# Patient Record
Sex: Female | Born: 1935 | Race: Black or African American | Hispanic: No | Marital: Single | Smoking: Former smoker
Health system: Southern US, Community
[De-identification: ages and names within clinical notes are randomized; demographics above are authoritative.]

## PROBLEM LIST (undated history)

## (undated) DIAGNOSIS — F0280 Dementia in other diseases classified elsewhere without behavioral disturbance: Secondary | ICD-10-CM

## (undated) DIAGNOSIS — G3109 Other frontotemporal dementia: Secondary | ICD-10-CM

## (undated) DIAGNOSIS — H269 Unspecified cataract: Secondary | ICD-10-CM

## (undated) DIAGNOSIS — I10 Essential (primary) hypertension: Secondary | ICD-10-CM

## (undated) DIAGNOSIS — Z8619 Personal history of other infectious and parasitic diseases: Secondary | ICD-10-CM

## (undated) HISTORY — PX: FINGER SURGERY: SHX640

---

## 2013-04-25 ENCOUNTER — Emergency Department (HOSPITAL_COMMUNITY): Payer: Medicare Other

## 2013-04-25 ENCOUNTER — Encounter (HOSPITAL_COMMUNITY): Payer: Self-pay | Admitting: Emergency Medicine

## 2013-04-25 ENCOUNTER — Emergency Department (HOSPITAL_COMMUNITY)
Admission: EM | Admit: 2013-04-25 | Discharge: 2013-04-25 | Disposition: A | Discharge status: Home / Self Care | Payer: Medicare Other | Attending: Emergency Medicine | Admitting: Emergency Medicine

## 2013-04-25 DIAGNOSIS — F29 Unspecified psychosis not due to a substance or known physiological condition: Secondary | ICD-10-CM | POA: Insufficient documentation

## 2013-04-25 DIAGNOSIS — I10 Essential (primary) hypertension: Secondary | ICD-10-CM | POA: Insufficient documentation

## 2013-04-25 DIAGNOSIS — F0390 Unspecified dementia without behavioral disturbance: Secondary | ICD-10-CM | POA: Insufficient documentation

## 2013-04-25 HISTORY — DX: Other frontotemporal dementia: G31.09

## 2013-04-25 HISTORY — DX: Dementia in other diseases classified elsewhere, unspecified severity, without behavioral disturbance, psychotic disturbance, mood disturbance, and anxiety: F02.80

## 2013-04-25 HISTORY — DX: Essential (primary) hypertension: I10

## 2013-04-25 LAB — CBC WITH DIFFERENTIAL/PLATELET
BASOS ABS: 0 10*3/uL (ref 0.0–0.1)
BASOS PCT: 0 % (ref 0–1)
EOS ABS: 0.1 10*3/uL (ref 0.0–0.7)
Eosinophils Relative: 2 % (ref 0–5)
HCT: 37.1 % (ref 36.0–46.0)
Hemoglobin: 11.9 g/dL — ABNORMAL LOW (ref 12.0–15.0)
Lymphocytes Relative: 31 % (ref 12–46)
Lymphs Abs: 1.6 10*3/uL (ref 0.7–4.0)
MCH: 27.9 pg (ref 26.0–34.0)
MCHC: 32.1 g/dL (ref 30.0–36.0)
MCV: 86.9 fL (ref 78.0–100.0)
MONOS PCT: 7 % (ref 3–12)
Monocytes Absolute: 0.4 10*3/uL (ref 0.1–1.0)
NEUTROS PCT: 60 % (ref 43–77)
Neutro Abs: 3 10*3/uL (ref 1.7–7.7)
PLATELETS: 169 10*3/uL (ref 150–400)
RBC: 4.27 MIL/uL (ref 3.87–5.11)
RDW: 14.8 % (ref 11.5–15.5)
WBC: 5 10*3/uL (ref 4.0–10.5)

## 2013-04-25 LAB — COMPREHENSIVE METABOLIC PANEL
ALBUMIN: 3.7 g/dL (ref 3.5–5.2)
ALT: 12 U/L (ref 0–35)
AST: 17 U/L (ref 0–37)
Alkaline Phosphatase: 58 U/L (ref 39–117)
BUN: 19 mg/dL (ref 6–23)
CO2: 23 mEq/L (ref 19–32)
Calcium: 9.5 mg/dL (ref 8.4–10.5)
Chloride: 102 mEq/L (ref 96–112)
Creatinine, Ser: 0.94 mg/dL (ref 0.50–1.10)
GFR calc Af Amer: 66 mL/min — ABNORMAL LOW (ref >90)
GFR calc non Af Amer: 57 mL/min — ABNORMAL LOW (ref >90)
GLUCOSE: 88 mg/dL (ref 70–99)
POTASSIUM: 4.5 meq/L (ref 3.7–5.3)
SODIUM: 140 meq/L (ref 137–147)
TOTAL PROTEIN: 7.4 g/dL (ref 6.0–8.3)
Total Bilirubin: 0.3 mg/dL (ref 0.3–1.2)

## 2013-04-25 LAB — URINALYSIS, ROUTINE W REFLEX MICROSCOPIC
Bilirubin Urine: NEGATIVE
Glucose, UA: NEGATIVE mg/dL
Hgb urine dipstick: NEGATIVE
Ketones, ur: NEGATIVE mg/dL
LEUKOCYTES UA: NEGATIVE
NITRITE: NEGATIVE
PROTEIN: NEGATIVE mg/dL
SPECIFIC GRAVITY, URINE: 1.015 (ref 1.005–1.030)
Urobilinogen, UA: 0.2 mg/dL (ref 0.0–1.0)
pH: 6 (ref 5.0–8.0)

## 2013-04-25 LAB — I-STAT TROPONIN, ED: TROPONIN I, POC: 0 ng/mL (ref 0.00–0.08)

## 2013-04-25 MED ORDER — LISINOPRIL 10 MG PO TABS: 10.0000 mg | ORAL_TABLET | Freq: Once | ORAL | Status: DC

## 2013-04-25 MED ORDER — LISINOPRIL 10 MG PO TABS
10.0000 mg | ORAL_TABLET | Freq: Once | ORAL | Status: AC
  Administered 2013-04-25: 10 mg via ORAL
  Filled 2013-04-25: qty 1

## 2013-04-25 MED ORDER — METOPROLOL TARTRATE 25 MG PO TABS: 25.0000 mg | ORAL_TABLET | Freq: Once | ORAL | Status: DC

## 2013-04-25 MED ORDER — HYDROCHLOROTHIAZIDE 25 MG PO TABS
25.0000 mg | ORAL_TABLET | Freq: Every day | ORAL | Status: DC
  Administered 2013-04-25: 25 mg via ORAL
  Filled 2013-04-25: qty 1

## 2013-04-25 MED ORDER — LISINOPRIL 20 MG PO TABS: 20.0000 mg | ORAL_TABLET | Freq: Every day | ORAL | Status: DC

## 2013-04-25 NOTE — ED Notes (Signed)
Patient transported to CT 

## 2013-04-25 NOTE — Discharge Instructions (Signed)
You need a sitter while awake. You should be placed at memory care unit when there is an open bed.   Continue taking namenda as prescribed.   Increased lisinopril to 20 mg daily.   Repeat blood pressure in a week.   Follow up with your doctor.   Return to ER if you are falling, more confused.

## 2013-04-25 NOTE — ED Notes (Signed)
Family at bedside. 

## 2013-04-25 NOTE — ED Notes (Signed)
MD at bedside. 

## 2013-04-25 NOTE — ED Notes (Signed)
Pt brought here by family from [facility], pt is hallucinating and has increased confusion since yesterday, she has lucid periods but then will think she is in [place]

## 2013-04-25 NOTE — ED Provider Notes (Signed)
CSN: [id number]     Arrival date & time 04/25/13  1336 History   First MD Initiated Contact with Patient 04/25/13 1343     Chief Complaint  Patient presents with  . Altered Mental Status     (Consider location/radiation/quality/duration/timing/severity/associated sxs/prior Treatment) The history is provided by the patient and the nursing home. The history is limited by the condition of the patient.  Marissa Chung is a 80 y.o. female hx of frontal lobe dementia, hypertension here with confusion, hallucinations. As per assisted living, she has been more confused than usual. She is from [facility]. She thought she is still back in [place]. She also saw someone in her room that is sleeping there. Denies auditory hallucinations. She was diagnosed with dementia in January when she was hospitalized for confusion. Denies fever, chills, or cough or chest pain or abdominal pain.    Level V caveat- dementia   Past Medical History  Diagnosis Date  . Hypertension   . Frontal lobe dementia    History reviewed. No pertinent past surgical history. History reviewed. No pertinent family history. History  Substance Use Topics  . Smoking status: Never Smoker   . Smokeless tobacco: Not on file  . Alcohol Use: No   OB History   Grav Para Term Preterm Abortions TAB SAB Ect Mult Living                 Review of Systems  Unable to perform ROS: Dementia      Allergies  Shellfish allergy  Home Medications   Prior to Admission medications   Not on File   BP 185/83  Pulse 64  Temp(Src) 98 F (36.7 C) (Oral)  Resp 7  SpO2 100% Physical Exam  Nursing note and vitals reviewed. Constitutional:  Chronically ill, confused   HENT:  Head: Normocephalic.  Eyes: Conjunctivae are normal. Pupils are equal, round, and reactive to light.  Neck: Normal range of motion. Neck supple.  Cardiovascular: Normal rate, regular rhythm and normal heart sounds.   Pulmonary/Chest: Effort  normal and breath sounds normal. No respiratory distress. She has no wheezes. She has no rales.  Abdominal: Soft. Bowel sounds are normal. She exhibits no distension. There is no tenderness. There is no rebound.  Musculoskeletal: Normal range of motion.  Neurological: She is alert.  A&O x 2. Thinks she is in [place]. Nl strength throughout   Skin: Skin is warm and dry.  Psychiatric:  Confused     ED Course  Procedures (including critical care time) Labs Review Labs Reviewed  COMPREHENSIVE METABOLIC PANEL - Abnormal; Notable for the following:    GFR calc non Af Amer 57 (*)    GFR calc Af Amer 66 (*)    All other components within normal limits  CBC WITH DIFFERENTIAL - Abnormal; Notable for the following:    Hemoglobin 11.9 (*)    All other components within normal limits  URINE CULTURE  URINALYSIS, ROUTINE W REFLEX MICROSCOPIC  CBC WITH DIFFERENTIAL  I-STAT TROPOININ, ED    Imaging Review Ct Head Wo Contrast  04/25/2013   CLINICAL DATA:  Altered mental status.  EXAM: CT HEAD WITHOUT CONTRAST  TECHNIQUE: Contiguous axial images were obtained from the base of the skull through the vertex without intravenous contrast.  COMPARISON:  None.  FINDINGS: No acute infarct, hemorrhage, or mass lesion is present. The ventricles are of normal size. No significant extraaxial fluid collection is present.  May left sphenoid sinuses chronically opacified. The paranasal  sinuses and mastoid air cells are otherwise clear. The osseous skull is intact.  IMPRESSION: 1. Normal CT appearance of the brain for age. 2. 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   Electronically Signed   By: Gennette Pachris  Mattern M.D.   On: 04/25/2013 14:45     EKG Interpretation None      MDM   Final diagnoses:  None    Marissa Chung is a 80 y.o. female here with confusion, visual hallucinations. Consider worsening frontal dementia vs sepsis. Will do sepsis workup. Will likely need memory care unit for dementia.   5:37  PM I called assisted living, they feel that she needs a sitter until they have opening at the memory care unit. Social work involved. CT head and labs and UA unremarkable. Stable to d/c back.    Richardean Canalavid H Stepheny Canal, MD 04/25/13 [phone]

## 2013-04-25 NOTE — Progress Notes (Signed)
EC CM and ED CSW  consulted to meet with patient and family regarding change of LOC. [facility]  With increased confusion, as per family. Pt  Has history of Dementia. [facility]. Family states. she needs a higher level of care, and would like her transferred to memory care unit. However, [facility] do not currently have any Memory care unit beds. Niece Jasmine DecemberSharon [phone] stated, these changes has been increased  since her Namenda had been discontinued. ED work up U/A, CBC, CMP, Head CT all unremarkable. [facility] at [facility] that no significant changes in patient status, she stated, that patient can return with 24 hour sitter. The facility apparently had a conversation about the 24hr sitters prior with the family as per Gershon CullPriscilla Nurse at [facility]. Discussed the 24 hour sitter with Niece she verbalizes understanding and  is agreeable. Offered assistance with Private duty list, she declined reports she has already set up for a sitter for tonight 7-7a. Patient and Family agreeable to return with 24 hour sitters, until a memory unit bed is available. Discussed discharge plan with Dr. Silverio LayYao, he is agreeable with the discharge plan.  My contact information provided if any questions or concerns should arises. No further ED CM needs identified.

## 2013-04-26 LAB — URINE CULTURE: Colony Count: 9000

## 2013-07-26 ENCOUNTER — Emergency Department (HOSPITAL_COMMUNITY): Payer: Medicare Other

## 2013-07-26 ENCOUNTER — Inpatient Hospital Stay (HOSPITAL_COMMUNITY)
Admission: EM | Admit: 2013-07-26 | Discharge: 2013-07-28 | DRG: 069 | Disposition: A | Discharge status: Home Health | Payer: Medicare Other | Attending: Internal Medicine | Admitting: Internal Medicine

## 2013-07-26 DIAGNOSIS — R634 Abnormal weight loss: Secondary | ICD-10-CM | POA: Diagnosis present

## 2013-07-26 DIAGNOSIS — E785 Hyperlipidemia, unspecified: Secondary | ICD-10-CM | POA: Diagnosis present

## 2013-07-26 DIAGNOSIS — D649 Anemia, unspecified: Secondary | ICD-10-CM | POA: Diagnosis present

## 2013-07-26 DIAGNOSIS — G459 Transient cerebral ischemic attack, unspecified: Principal | ICD-10-CM | POA: Diagnosis present

## 2013-07-26 DIAGNOSIS — I10 Essential (primary) hypertension: Secondary | ICD-10-CM | POA: Diagnosis present

## 2013-07-26 DIAGNOSIS — I674 Hypertensive encephalopathy: Secondary | ICD-10-CM | POA: Diagnosis present

## 2013-07-26 DIAGNOSIS — F0390 Unspecified dementia without behavioral disturbance: Secondary | ICD-10-CM | POA: Diagnosis present

## 2013-07-26 DIAGNOSIS — Z91013 Allergy to seafood: Secondary | ICD-10-CM

## 2013-07-26 DIAGNOSIS — G9340 Encephalopathy, unspecified: Secondary | ICD-10-CM | POA: Diagnosis present

## 2013-07-26 DIAGNOSIS — IMO0002 Reserved for concepts with insufficient information to code with codable children: Secondary | ICD-10-CM

## 2013-07-26 DIAGNOSIS — Z79899 Other long term (current) drug therapy: Secondary | ICD-10-CM | POA: Diagnosis not present

## 2013-07-26 DIAGNOSIS — R531 Weakness: Secondary | ICD-10-CM

## 2013-07-26 DIAGNOSIS — I519 Heart disease, unspecified: Secondary | ICD-10-CM | POA: Diagnosis present

## 2013-07-26 DIAGNOSIS — I6350 Cerebral infarction due to unspecified occlusion or stenosis of unspecified cerebral artery: Secondary | ICD-10-CM | POA: Diagnosis not present

## 2013-07-26 DIAGNOSIS — D638 Anemia in other chronic diseases classified elsewhere: Secondary | ICD-10-CM | POA: Diagnosis present

## 2013-07-26 DIAGNOSIS — G3109 Other frontotemporal dementia: Secondary | ICD-10-CM

## 2013-07-26 DIAGNOSIS — R29810 Facial weakness: Secondary | ICD-10-CM | POA: Diagnosis present

## 2013-07-26 DIAGNOSIS — E46 Unspecified protein-calorie malnutrition: Secondary | ICD-10-CM | POA: Diagnosis present

## 2013-07-26 DIAGNOSIS — R630 Anorexia: Secondary | ICD-10-CM | POA: Diagnosis present

## 2013-07-26 DIAGNOSIS — F0280 Dementia in other diseases classified elsewhere without behavioral disturbance: Secondary | ICD-10-CM | POA: Diagnosis present

## 2013-07-26 DIAGNOSIS — R5381 Other malaise: Secondary | ICD-10-CM | POA: Diagnosis present

## 2013-07-26 DIAGNOSIS — R5383 Other fatigue: Secondary | ICD-10-CM | POA: Diagnosis present

## 2013-07-26 LAB — I-STAT TROPONIN, ED: TROPONIN I, POC: 0 ng/mL (ref 0.00–0.08)

## 2013-07-26 LAB — DIFFERENTIAL
Basophils Absolute: 0 10*3/uL (ref 0.0–0.1)
Basophils Relative: 0 % (ref 0–1)
EOS PCT: 1 % (ref 0–5)
Eosinophils Absolute: 0 10*3/uL (ref 0.0–0.7)
LYMPHS PCT: 21 % (ref 12–46)
Lymphs Abs: 1 10*3/uL (ref 0.7–4.0)
MONO ABS: 0.4 10*3/uL (ref 0.1–1.0)
MONOS PCT: 8 % (ref 3–12)
NEUTROS ABS: 3.5 10*3/uL (ref 1.7–7.7)
Neutrophils Relative %: 70 % (ref 43–77)

## 2013-07-26 LAB — PROTIME-INR
INR: 1.09 (ref 0.00–1.49)
Prothrombin Time: 14.1 seconds (ref 11.6–15.2)

## 2013-07-26 LAB — COMPREHENSIVE METABOLIC PANEL
ALT: 11 U/L (ref 0–35)
AST: 15 U/L (ref 0–37)
Albumin: 3.3 g/dL — ABNORMAL LOW (ref 3.5–5.2)
Alkaline Phosphatase: 57 U/L (ref 39–117)
Anion gap: 13 (ref 5–15)
BILIRUBIN TOTAL: 0.5 mg/dL (ref 0.3–1.2)
BUN: 21 mg/dL (ref 6–23)
CALCIUM: 9.1 mg/dL (ref 8.4–10.5)
CHLORIDE: 100 meq/L (ref 96–112)
CO2: 24 mEq/L (ref 19–32)
CREATININE: 1.08 mg/dL (ref 0.50–1.10)
GFR, EST AFRICAN AMERICAN: 55 mL/min — AB (ref >90)
GFR, EST NON AFRICAN AMERICAN: 48 mL/min — AB (ref >90)
GLUCOSE: 147 mg/dL — AB (ref 70–99)
Potassium: 4.1 mEq/L (ref 3.7–5.3)
Sodium: 137 mEq/L (ref 137–147)
Total Protein: 6.6 g/dL (ref 6.0–8.3)

## 2013-07-26 LAB — MRSA PCR SCREENING: MRSA BY PCR: NEGATIVE

## 2013-07-26 LAB — APTT: APTT: 30 s (ref 24–37)

## 2013-07-26 LAB — CBC
HCT: 32 % — ABNORMAL LOW (ref 36.0–46.0)
HEMOGLOBIN: 10.4 g/dL — AB (ref 12.0–15.0)
MCH: 28.3 pg (ref 26.0–34.0)
MCHC: 32.5 g/dL (ref 30.0–36.0)
MCV: 87 fL (ref 78.0–100.0)
Platelets: 181 10*3/uL (ref 150–400)
RBC: 3.68 MIL/uL — ABNORMAL LOW (ref 3.87–5.11)
RDW: 14.6 % (ref 11.5–15.5)
WBC: 5 10*3/uL (ref 4.0–10.5)

## 2013-07-26 LAB — URINE MICROSCOPIC-ADD ON

## 2013-07-26 LAB — URINALYSIS, ROUTINE W REFLEX MICROSCOPIC
BILIRUBIN URINE: NEGATIVE
Glucose, UA: NEGATIVE mg/dL
Hgb urine dipstick: NEGATIVE
KETONES UR: NEGATIVE mg/dL
Nitrite: NEGATIVE
Protein, ur: NEGATIVE mg/dL
Specific Gravity, Urine: 1.005 (ref 1.005–1.030)
UROBILINOGEN UA: 0.2 mg/dL (ref 0.0–1.0)
pH: 6.5 (ref 5.0–8.0)

## 2013-07-26 MED ORDER — HYDRALAZINE HCL 20 MG/ML IJ SOLN
10.0000 mg | Freq: Four times a day (QID) | INTRAMUSCULAR | Status: DC | PRN
  Administered 2013-07-26: 10 mg via INTRAVENOUS
  Filled 2013-07-26: qty 1
  Filled 2013-07-26: qty 0.5

## 2013-07-26 MED ORDER — RISPERIDONE 0.5 MG PO TABS
0.5000 mg | ORAL_TABLET | Freq: Every day | ORAL | Status: DC
  Administered 2013-07-26 – 2013-07-27 (×2): 0.5 mg via ORAL
  Filled 2013-07-26 (×3): qty 1

## 2013-07-26 MED ORDER — SIMVASTATIN 10 MG PO TABS
10.0000 mg | ORAL_TABLET | Freq: Every day | ORAL | Status: DC
  Administered 2013-07-26 – 2013-07-27 (×2): 10 mg via ORAL
  Filled 2013-07-26 (×3): qty 1

## 2013-07-26 MED ORDER — TRAZODONE HCL 150 MG PO TABS
150.0000 mg | ORAL_TABLET | Freq: Every day | ORAL | Status: DC
  Administered 2013-07-26 – 2013-07-27 (×2): 150 mg via ORAL
  Filled 2013-07-26: qty 1
  Filled 2013-07-26: qty 3
  Filled 2013-07-26: qty 1

## 2013-07-26 MED ORDER — HYDRALAZINE HCL 20 MG/ML IJ SOLN
10.0000 mg | Freq: Once | INTRAMUSCULAR | Status: AC
  Administered 2013-07-26: 10 mg via INTRAVENOUS
  Filled 2013-07-26: qty 1

## 2013-07-26 MED ORDER — SENNOSIDES-DOCUSATE SODIUM 8.6-50 MG PO TABS: 1.0000 | ORAL_TABLET | Freq: Every evening | ORAL | Status: DC | PRN

## 2013-07-26 MED ORDER — RISPERIDONE 0.25 MG PO TABS
0.2500 mg | ORAL_TABLET | Freq: Every morning | ORAL | Status: DC
Start: 2013-07-27 — End: 2013-07-28
  Administered 2013-07-27 – 2013-07-28 (×2): 0.25 mg via ORAL
  Filled 2013-07-26 (×2): qty 1

## 2013-07-26 MED ORDER — STROKE: EARLY STAGES OF RECOVERY BOOK
Freq: Once | Status: DC
  Filled 2013-07-26: qty 1

## 2013-07-26 MED ORDER — MEMANTINE HCL 5 MG PO TABS
5.0000 mg | ORAL_TABLET | Freq: Two times a day (BID) | ORAL | Status: DC
  Administered 2013-07-26 – 2013-07-28 (×4): 5 mg via ORAL
  Filled 2013-07-26 (×5): qty 1

## 2013-07-26 MED ORDER — ASPIRIN 300 MG RE SUPP
300.0000 mg | Freq: Once | RECTAL | Status: AC
  Administered 2013-07-26: 300 mg via RECTAL
  Filled 2013-07-26: qty 1

## 2013-07-26 MED ORDER — RISPERIDONE 0.25 MG PO TABS
0.2500 mg | ORAL_TABLET | Freq: Two times a day (BID) | ORAL | Status: DC
  Filled 2013-07-26: qty 2

## 2013-07-26 MED ORDER — NICARDIPINE HCL IN NACL 20-0.86 MG/200ML-% IV SOLN
3.0000 mg/h | INTRAVENOUS | Status: DC
  Filled 2013-07-26: qty 200

## 2013-07-26 NOTE — H&P (Addendum)
[facility] History and Physical  [name] ZOX:[id number] DOB: 1933-08-01 DOA: 07/26/2013  Referring physician: ER physician PCP: Florentina JennyRIPP, HENRY, MD   Chief Complaint:   HPI:  80 year old female with past medical history of dementia, from an independent living facility, history of hypertension who [facility] with new onset lethargy and confusion. Patient is not a good historian due to history fo dementia but her daughter at the bedside is providing history. Patient's daughter came to visit her at ILF and noticed that her mother was very lethargic and seemed to be confused and had hard time speaking. Although she has dementia this was definitely worse than her baseline. No reports of loss of consciousness. No hallucinations. She did notice right sided facial droop but here in ED pt seems to have more of a left sided facial droop. No chest pain, shortness of breath or palpitations.  In ED, vitals are as follows: BP 141/70 and then 219/81. HR 63-74 and T max 98.7 F. Oxygen saturation was 95% on room air. Blood work revealed hemoglobin of 10.4. CT head did not show acute intracranial findings. She was admitted for TIA/CVA work up. Due to accelerated hypertension she was admitted to SDU.  Assessment & Plan    Active Problems:   Lethargy - admit for stroke work up - admit to SDU  - proceed with MRI brain - PT/OT/SLP evaluation - A1C, lipid panel, 2 D ECHO, carotid doppler - aspirin PR until swallow eval passed    Accelerated hypertension  - hydralazine PRN and instructed initial SBP goal to be in 170's range   HLD - lipid panel - continue statin    Anemia of chronic disease  - no signs of active bleeding - repeat CBC in AM  DVT prophylaxis: SCD's bilaterally  Radiological Exams on Admission: Ct Head Wo Contrast 07/26/2013   IMPRESSION: 1. No acute intracranial abnormalities. 2. 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   Electronically Signed   By: Trudie Reedaniel  Entrikin M.D.   On: 07/26/2013 14:21    EKG: pending upon admission   Code Status: Full Family Communication: Plan of care discussed with the patient  Disposition Plan: Admit for further evaluation  Manson PasseyEVINE, Kaire Stary, MD  [facility] Pager [phone]  Review of Systems:  Constitutional: Negative for fever, chills and malaise/fatigue. Negative for diaphoresis.  HENT: Negative for hearing loss, ear pain, nosebleeds, congestion, sore throat, neck pain, tinnitus and ear discharge.   Eyes: Negative for blurred vision, double vision, photophobia, pain, discharge and redness.  Respiratory: Negative for cough, hemoptysis, sputum production, shortness of breath, wheezing and stridor.   Cardiovascular: Negative for chest pain, palpitations, orthopnea, claudication and leg swelling.  Gastrointestinal: Negative for nausea, vomiting and abdominal pain. Negative for heartburn, constipation, blood in stool and melena.  Genitourinary: Negative for dysuria, urgency, frequency, hematuria and flank pain.  Musculoskeletal: Negative for myalgias, back pain, joint pain and falls.  Skin: Negative for itching and rash.  Neurological: per HPI Endo/Heme/Allergies: Negative for environmental allergies and polydipsia. Does not bruise/bleed easily.  Psychiatric/Behavioral: Negative for suicidal ideas. The patient is not nervous/anxious.      Past Medical History  Diagnosis Date  . Hypertension   . Frontal lobe dementia    No past surgical history on file. Social History:  reports that she has never smoked. She does not have any smokeless tobacco history on file. She reports that she does not drink alcohol or use  illicit drugs.  Allergies  Allergen Reactions  . Shellfish Allergy Hives    Family History: unable to obtain due to history of dementia   Prior to Admission medications   Medication Sig Start Date End Date Taking? Authorizing Provider   lisinopril (PRINIVIL,ZESTRIL) 20 MG tablet Take 1 tablet (20 mg total) by mouth daily. 04/25/13  Yes Richardean Canal, MD  memantine (NAMENDA) 5 MG tablet Take 5 mg by mouth 2 (two) times daily.   Yes Historical Provider, MD  risperiDONE (RISPERDAL) 0.25 MG tablet Take 0.25-0.5 mg by mouth 2 (two) times daily. .25 mg am and .50 mg at bedtime   Yes Historical Provider, MD  simvastatin (ZOCOR) 10 MG tablet Take 10 mg by mouth at bedtime.   Yes Historical Provider, MD  traZODone (DESYREL) 150 MG tablet Take 150 mg by mouth at bedtime.   Yes Historical Provider, MD   Physical Exam: Filed Vitals:   07/26/13 1500 07/26/13 1528 07/26/13 1548 07/26/13 1729  BP: 177/75  177/75 209/87  Pulse: 66  73 73  Temp:  98.3 F (36.8 C)    TempSrc:      Resp: 16   12  SpO2: 98%   100%    Physical Exam  Constitutional: Appears well-developed and well-nourished. No distress.  HENT: Normocephalic. No tonsillar erythema or exudates Eyes: Conjunctivae and EOM are normal. PERRLA, no scleral icterus.  Neck: Normal ROM. Neck supple. No JVD. No tracheal deviation. No thyromegaly.  CVS: RRR, S1/S2 +, no murmurs, no gallops, no carotid bruit.  Pulmonary: Effort and breath sounds normal, no stridor, rhonchi, wheezes, rales.  Abdominal: Soft. BS +,  no distension, tenderness, rebound or guarding.  Musculoskeletal: Normal range of motion. No edema and no tenderness.  Lymphadenopathy: No lymphadenopathy noted, cervical, inguinal. Neuro: Alert. Normal reflexes. Appears to have mild facial droop on left. Follows simple commands. Skin: Skin is warm and dry. No rash noted. Not diaphoretic. No erythema. No pallor.  Psychiatric: Normal mood and affect. Behavior, judgment, thought content normal.   Labs on Admission:  Basic Metabolic Panel:  Recent Labs Lab 07/26/13 1158  NA 137  K 4.1  CL 100  CO2 24  GLUCOSE 147*  BUN 21  CREATININE 1.08  CALCIUM 9.1   Liver Function Tests:  Recent Labs Lab 07/26/13 1158   AST 15  ALT 11  ALKPHOS 57  BILITOT 0.5  PROT 6.6  ALBUMIN 3.3*   CBC:  Recent Labs Lab 07/26/13 1158  WBC 5.0  NEUTROABS 3.5  HGB 10.4*  HCT 32.0*  MCV 87.0  PLT 181   If 7PM-7AM, please contact night-coverage [email or web address] Password [facility] 07/26/2013, 5:46 PM

## 2013-07-26 NOTE — ED Notes (Signed)
[facility]  Pt has equal grips in Upper extremities, and good strength to lower extremities.  Pt usually alert but not oriented, pt gradually becoming more and more confused over time.  Pt accompanied by niece, who says that she noticed she was dropping food and drink this morning at breakfast. Niece also states that pt had a right sided droop to face this morning.  Independent care living facility reported to niece she was fine as of this morning at 8 am.  Pts smile is symmetrical.  Family at bedside.

## 2013-07-26 NOTE — ED Provider Notes (Signed)
CSN: [id number]     Arrival date & time 07/26/13  1244 History   First MD Initiated Contact with Patient 07/26/13 1312     Chief Complaint  Patient presents with  . Weakness    HPI Patient presents to the emergency room for evaluation of a possible stroke. The patient has history of frontal lobe dementia. She lives at an independent living facility that has family checks on her regularly. Her medications are also given to her by the staff at the facility. Patient was last seen normal yesterday. This morning when the family went to check on her they found her to be somewhat lethargic. There is also a question of left-sided facial drooping. She was also dropping food and drink when she was trying to eat breakfast this morning.    The patient herself denies any complaints. She is not having any headache. She denies any numbness or weakness. Denies any trouble with chest pain, shortness of breath abdominal pain, vomiting or diarrhea. Past Medical History  Diagnosis Date  . Hypertension   . Frontal lobe dementia    No past surgical history on file. No family history on file. History  Substance Use Topics  . Smoking status: Never Smoker   . Smokeless tobacco: Not on file  . Alcohol Use: No   OB History   Grav Para Term Preterm Abortions TAB SAB Ect Mult Living                 Review of Systems  All other systems reviewed and are negative.     Allergies  Shellfish allergy  Home Medications   Prior to Admission medications   Medication Sig Start Date End Date Taking? Authorizing Provider  lisinopril (PRINIVIL,ZESTRIL) 20 MG tablet Take 1 tablet (20 mg total) by mouth daily. 04/25/13  Yes Richardean Canal, MD  memantine (NAMENDA) 5 MG tablet Take 5 mg by mouth 2 (two) times daily.   Yes Historical Provider, MD  risperiDONE (RISPERDAL) 0.25 MG tablet Take 0.25-0.5 mg by mouth 2 (two) times daily. .25 mg am and .50 mg at bedtime   Yes Historical Provider, MD  simvastatin (ZOCOR) 10 MG  tablet Take 10 mg by mouth at bedtime.   Yes Historical Provider, MD  traZODone (DESYREL) 150 MG tablet Take 150 mg by mouth at bedtime.   Yes Historical Provider, MD   BP 177/75  Pulse 73  Temp(Src) 98.3 F (36.8 C) (Oral)  Resp 16  SpO2 98% Physical Exam  Nursing note and vitals reviewed. Constitutional: She appears well-developed and well-nourished. She appears lethargic. No distress.  HENT:  Head: Normocephalic and atraumatic.  Right Ear: External ear normal.  Left Ear: External ear normal.  Mouth/Throat: Oropharynx is clear and moist.  Eyes: Conjunctivae are normal. Right eye exhibits no discharge. Left eye exhibits no discharge. No scleral icterus.  Neck: Neck supple. No tracheal deviation present.  Cardiovascular: Normal rate, regular rhythm and intact distal pulses.   Pulmonary/Chest: Effort normal and breath sounds normal. No stridor. No respiratory distress. She has no wheezes. She has no rales.  Abdominal: Soft. Bowel sounds are normal. She exhibits no distension. There is no tenderness. There is no rebound and no guarding.  Musculoskeletal: She exhibits no edema and no tenderness.  Neurological: She has normal strength. She appears lethargic. No cranial nerve deficit (No facial droop, extraocular movements intact,? tongue deviates to right ) or sensory deficit. She exhibits normal muscle tone. She displays no seizure activity. Coordination normal. GCS  eye subscore is 4. GCS verbal subscore is 4. GCS motor subscore is 6.  No pronator drift bilateral upper extrem, able to hold both legs off bed for 5 seconds, sensation intact in all extremities, no visual field cuts, no left or right sided neglect,  no nystagmus noted; answers questions appropriately but appears to fall back asleep unless spoken to   Skin: Skin is warm and dry. No rash noted.  Psychiatric: She has a normal mood and affect.    ED Course  Procedures (including critical care time) Labs Review Labs Reviewed   CBC - Abnormal; Notable for the following:    RBC 3.68 (*)    Hemoglobin 10.4 (*)    HCT 32.0 (*)    All other components within normal limits  COMPREHENSIVE METABOLIC PANEL - Abnormal; Notable for the following:    Glucose, Bld 147 (*)    Albumin 3.3 (*)    GFR calc non Af Amer 48 (*)    GFR calc Af Amer 55 (*)    All other components within normal limits  URINALYSIS, ROUTINE W REFLEX MICROSCOPIC - Abnormal; Notable for the following:    Leukocytes, UA SMALL (*)    All other components within normal limits  PROTIME-INR  APTT  DIFFERENTIAL  URINE MICROSCOPIC-ADD ON  I-STAT TROPOININ, ED    Imaging Review Ct Head Wo Contrast  07/26/2013   CLINICAL DATA:  Weakness and right facial droop.  EXAM: CT HEAD WITHOUT CONTRAST  TECHNIQUE: Contiguous axial images were obtained from the base of the skull through the vertex without intravenous contrast.  COMPARISON:  Head CT 04/25/2013.  FINDINGS: No acute intracranial abnormalities. Specifically, no evidence of acute intracranial hemorrhage, no definite findings of acute/subacute cerebral ischemia, no mass, mass effect, hydrocephalus or abnormal intra or extra-axial fluid collections. Visualized paranasal sinuses and mastoids are well pneumatized, with exception of the left sphenoid sinus which remains chronically opacified. No acute displaced skull fractures are identified.  IMPRESSION: 1. No acute intracranial abnormalities. 2. 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   Electronically Signed   By: Trudie Reedaniel  Entrikin M.D.   On: 07/26/2013 14:21     EKG Interpretation   Date/Time:  Saturday July 26 2013 12:53:43 EDT Ventricular Rate:  69 PR Interval:  168 QRS Duration: 81 QT Interval:  389 QTC Calculation: 417 R Axis:   2 Text Interpretation:  Sinus rhythm Low voltage, precordial leads , new  since last tracing 25 April 2013 Confirmed by [facility]  MD-J, Yvonna Brun [phone] on  07/26/2013 1:02:33 PM     1533   Updated family on findings.  Pt is alert and awake eating lunch in the ED.  No focal deficits noted.   Waiting on UA result. MDM   Final diagnoses:  Weakness  Transient cerebral ischemia, unspecified transient cerebral ischemia type    UA not definitive for UTI.  Symptoms concerning earlier for possible TIA, occult CVA.  Will admit for monitor, further eval consider MRI.      Linwood DibblesJon Tashaya Ancrum, MD 07/26/13 [phone]

## 2013-07-26 NOTE — ED Notes (Signed)
Pt and family declined I&O cath.  Pt ambulated to bathroom to give clean catch specimen.

## 2013-07-27 ENCOUNTER — Encounter (HOSPITAL_COMMUNITY): Payer: Self-pay | Admitting: *Deleted

## 2013-07-27 ENCOUNTER — Inpatient Hospital Stay (HOSPITAL_COMMUNITY): Payer: Medicare Other

## 2013-07-27 DIAGNOSIS — R5383 Other fatigue: Secondary | ICD-10-CM

## 2013-07-27 DIAGNOSIS — R5381 Other malaise: Secondary | ICD-10-CM

## 2013-07-27 DIAGNOSIS — G459 Transient cerebral ischemic attack, unspecified: Secondary | ICD-10-CM

## 2013-07-27 DIAGNOSIS — I319 Disease of pericardium, unspecified: Secondary | ICD-10-CM

## 2013-07-27 LAB — URINE CULTURE: SPECIAL REQUESTS: NORMAL

## 2013-07-27 LAB — LIPID PANEL
CHOL/HDL RATIO: 2.8 ratio
CHOLESTEROL: 151 mg/dL (ref 0–200)
HDL: 53 mg/dL (ref >39)
LDL Cholesterol: 89 mg/dL (ref 0–99)
Triglycerides: 44 mg/dL (ref <150)
VLDL: 9 mg/dL (ref 0–40)

## 2013-07-27 LAB — HEMOGLOBIN A1C
HEMOGLOBIN A1C: 6.1 % — AB (ref <5.7)
MEAN PLASMA GLUCOSE: 128 mg/dL — AB (ref <117)

## 2013-07-27 MED ORDER — SODIUM CHLORIDE 0.9 % IV BOLUS (SEPSIS)
250.0000 mL | Freq: Once | INTRAVENOUS | Status: AC
  Administered 2013-07-27: 250 mL via INTRAVENOUS

## 2013-07-27 MED ORDER — HYDRALAZINE HCL 10 MG PO TABS
10.0000 mg | ORAL_TABLET | Freq: Four times a day (QID) | ORAL | Status: DC
  Administered 2013-07-27 (×2): 10 mg via ORAL
  Filled 2013-07-27 (×9): qty 1

## 2013-07-27 MED ORDER — HYDRALAZINE HCL 20 MG/ML IJ SOLN: 10.0000 mg | Freq: Once | INTRAMUSCULAR | Status: DC

## 2013-07-27 NOTE — Progress Notes (Signed)
Pt from ICU, MRI done, up in chair, no change from AM assessment.  Family at bedside.  See neuro assessment as charted

## 2013-07-27 NOTE — Progress Notes (Signed)
VASCULAR LAB PRELIMINARY  PRELIMINARY  PRELIMINARY  PRELIMINARY  Carotid duplex  completed.    Preliminary report:  Bilateral:  1-39% ICA stenosis.  Vertebral artery flow is antegrade.      Emma Schupp, RVT 07/27/2013, 1:03 PM

## 2013-07-27 NOTE — Progress Notes (Signed)
  Echocardiogram 2D Echocardiogram has been performed.  Leta JunglingCooper, Arrion Broaddus M 07/27/2013, 8:13 AM

## 2013-07-27 NOTE — Evaluation (Addendum)
Physical Therapy Evaluation Patient Details Name: Marissa Chung MRN: [id number] DOB: 1933-03-02 Today's Date: 07/27/2013   History of Present Illness  80 year old female with past medical history of dementia, from an independent living facility, history of hypertension who [facility] ED 07/26/2013 with new onset lethargy and confusion.  Clinical Impression  *Pt admitted with *TIA*. Pt currently with functional limitations due to the deficits listed below (see PT Problem List).  Pt will benefit from skilled PT to increase their independence and safety with mobility to allow discharge to the venue listed below.   Pt ambulated 300' without an assistive device, no LOB. Her functional and cognitive baseline is unknown as family is not present, and pt has dementia so cannot provide detailed hx. She appears safe to return to ILF. Will follow acutely for high level balance training.   **    Follow Up Recommendations No PT follow up    Equipment Recommendations  None recommended by PT    Recommendations for Other Services       Precautions / Restrictions Precautions Precautions: Fall Restrictions Weight Bearing Restrictions: No      Mobility  Bed Mobility Overal bed mobility: Modified Independent                Transfers Overall transfer level: Independent                  Ambulation/Gait Ambulation/Gait assistance: Supervision Ambulation Distance (Feet): 300 Feet Assistive device: None Gait Pattern/deviations: WFL(Within Functional Limits)     General Gait Details: no LOB, supervision for safety due to dementia, verbal cues to negotiate obstacles  Stairs            Wheelchair Mobility    Modified Rankin (Stroke Patients Only)       Balance Overall balance assessment: Needs assistance   Sitting balance-Leahy Scale: Normal       Standing balance-Leahy Scale: Good Standing balance comment: would benefit from high level balance assessment                              Pertinent Vitals/Pain *pt denied pain**    Home Living Family/patient expects to be discharged to:: Other (Comment) (independent living facility)                      Prior Function           Comments: pt unable to provide detailed hx 2* confusion, she stated she doesn't use an AD to walk     Hand Dominance        Extremity/Trunk Assessment   Upper Extremity Assessment: Overall WFL for tasks assessed           Lower Extremity Assessment: Overall WFL for tasks assessed      Cervical / Trunk Assessment: Normal  Communication   Communication: No difficulties  Cognition Arousal/Alertness: Awake/alert Behavior During Therapy: WFL for tasks assessed/performed Overall Cognitive Status: No family/caregiver present to determine baseline cognitive functioning (h/o dementia per chart, pt oriented to place and self )       Memory: Decreased short-term memory              General Comments      Exercises        Assessment/Plan    PT Assessment Patient needs continued PT services  PT Diagnosis Altered mental status   PT Problem List Decreased balance;Decreased cognition  PT  Treatment Interventions Balance training;Patient/family education;Gait training   PT Goals (Current goals can be found in the Care Plan section) Acute Rehab PT Goals Patient Stated Goal: none stated PT Goal Formulation: Patient unable to participate in goal setting Time For Goal Achievement: 08/10/13 Potential to Achieve Goals: Good    Frequency Min 3X/week   Barriers to discharge        Co-evaluation               End of Session Equipment Utilized During Treatment: Gait belt Activity Tolerance: Patient tolerated treatment well Patient left: in chair;with call bell/phone within reach Nurse Communication: Mobility status         Time: 1610-9604 PT Time Calculation (min): 9 min   Charges:   PT Evaluation $Initial PT  Evaluation Tier I: 1 Procedure PT Treatments $Gait Training: 8-22 mins   PT G Codes:          Tamala Ser 07/27/2013, 1:57 PM [phone]

## 2013-07-27 NOTE — Progress Notes (Signed)
Patient ID: Marissa Chung, female   DOB: [birth year], 80 y.o.   MRN: [id number] [facility] PROGRESS NOTE  Marissa Chung WUJ:[id number] DOB: 01/01/1934 DOA: 07/26/2013 PCP: Florentina Jenny, MD  Brief narrative: 80 year old female with past medical history of dementia, from an independent living facility (ILF), history of hypertension who presented to [facility] ED 07/26/2013 with new onset lethargy and confusion. Patient's daughter came to visit her at ILF and noticed that her mother was very lethargic, confused and had hard time speaking.   In ED, vitals are as follows: BP 141/70 and then 219/81. HR 63-74 and T max 98.7 F. Oxygen saturation was 95% on room air. Blood work revealed hemoglobin of 10.4. CT head did not show acute intracranial findings. She was admitted for TIA/CVA work up. Due to accelerated hypertension she was admitted to SDU.   Assessment & Plan    Active Problems:  Lethargy, confusion, aphasia  Possibly worsening dementia, TIA or CVA. Per patient's daughter pt seemed to have right sided facial droop in ILF but in ED it appeared more as a left sided facial droop.  CT head did not show acute intracranial findings  Aspirin given rectally on admission. She passed swallow eval so will switch to PO   PT/OT evaluation pending  A1c is pending. Lipid panel is essentially at goal. Initial troponin was normal.  Follow up carotid doppler and 2 D ECHO  Follow up MRI brain Accelerated hypertension   BP was controlled on hydralazine IV PRN. Will put order for PO hydralazine 10 mg every 6 hours and hydralazine IV PRN.  HLD   Lipid panel is within normal limit.  Continue stain therapy once confirmed she can take PO Anemia of chronic disease   No signs of bleeding  No current indications for transfusion  Dementia  Continue risperidone and memantine.   IV access: Peripheral IV DVT prophylaxis: SCD's bilaterally   Code Status: full code  Family Communication: family not at  the bedside this morning; daughter was updated on plan of care on admission. She requested SW to be involved for help with SNF placement. Disposition Plan: PT evaluation pending; likely SNF; stable for transfer out of SDU. Order is in place. This was communicated to patient's RN.  Manson Passey, MD  [facility] Pager [phone]  If 7PM-7AM, please contact night-coverage [email or web address] Password [facility] 07/27/2013, 6:43 AM   LOS: 1 day   Consultants:  None   Procedures:  None   Antibiotics:  None   HPI/Subjective: No acute overnight events.  Objective: Filed Vitals:   07/27/13 0300 07/27/13 0400 07/27/13 0500 07/27/13 0600  BP: 131/49 171/61 147/53 137/45  Pulse: 58 93 69 59  Temp:  98.5 F (36.9 C)    TempSrc:  Oral    Resp: 14 28 17 14   Height:      Weight:      SpO2: 95% 99% 96% 97%    Intake/Output Summary (Last 24 hours) at 07/27/13 8657 Last data filed at 07/27/13 0600  Gross per 24 hour  Intake    110 ml  Output   1100 ml  Net   -990 ml    Exam:   General:  Pt is alert, no distress  Cardiovascular: Regular rate and rhythm, S1/S2 appreciated   Respiratory: Clear to auscultation bilaterally, no wheezing, no crackles, no rhonchi  Abdomen: Soft, non tender, non distended, bowel sounds present  Extremities: No edema, pulses DP and PT palpable bilaterally  Neuro: Has slight left facial  droop   Data Reviewed: Basic Metabolic Panel:  Recent Labs Lab 07/26/13 1158  NA 137  K 4.1  CL 100  CO2 24  GLUCOSE 147*  BUN 21  CREATININE 1.08  CALCIUM 9.1   Liver Function Tests:  Recent Labs Lab 07/26/13 1158  AST 15  ALT 11  ALKPHOS 57  BILITOT 0.5  PROT 6.6  ALBUMIN 3.3*   No results found for this basename: LIPASE, AMYLASE,  in the last 168 hours No results found for this basename: AMMONIA,  in the last 168 hours CBC:  Recent Labs Lab 07/26/13 1158  WBC 5.0  NEUTROABS 3.5  HGB 10.4*  HCT 32.0*  MCV 87.0  PLT 181   Cardiac  Enzymes: No results found for this basename: CKTOTAL, CKMB, CKMBINDEX, TROPONINI,  in the last 168 hours BNP: No components found with this basename: POCBNP,  CBG: No results found for this basename: GLUCAP,  in the last 168 hours  Recent Results (from the past 240 hour(s))  MRSA PCR SCREENING     Status: None   Collection Time    07/26/13  7:39 PM      Result Value Ref Range Status   MRSA by PCR NEGATIVE  NEGATIVE Final     Studies: Ct Head Wo Contrast 07/26/2013    IMPRESSION: 1. No acute intracranial abnormalities. 2. 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      Scheduled Meds: . memantine  5 mg Oral BID  . risperiDONE  0.25 mg Oral q morning - 10a  . risperiDONE  0.5 mg Oral QHS  . simvastatin  10 mg Oral QHS  . traZODone  150 mg Oral QHS   Continuous Infusions: . niCARDipine Stopped (07/26/13 1903)

## 2013-07-28 MED ORDER — ENSURE COMPLETE PO LIQD: 237.0000 mL | Freq: Two times a day (BID) | ORAL | Status: DC

## 2013-07-28 MED ORDER — ASPIRIN 81 MG PO CHEW
81.0000 mg | CHEWABLE_TABLET | Freq: Every day | ORAL | Status: DC
  Administered 2013-07-28: 81 mg via ORAL
  Filled 2013-07-28: qty 1

## 2013-07-28 MED ORDER — ASPIRIN 81 MG PO CHEW: 81.0000 mg | CHEWABLE_TABLET | Freq: Every day | ORAL | Status: DC

## 2013-07-28 MED ORDER — MEGESTROL ACETATE 40 MG PO TABS: 40.0000 mg | ORAL_TABLET | Freq: Every day | ORAL | Status: DC

## 2013-07-28 MED ORDER — AMLODIPINE BESYLATE 5 MG PO TABS
5.0000 mg | ORAL_TABLET | Freq: Every day | ORAL | Status: DC
  Administered 2013-07-28: 5 mg via ORAL
  Filled 2013-07-28: qty 1

## 2013-07-28 MED ORDER — ENSURE COMPLETE PO LIQD
237.0000 mL | Freq: Two times a day (BID) | ORAL | Status: DC
  Administered 2013-07-28: 237 mL via ORAL

## 2013-07-28 NOTE — Progress Notes (Signed)
CSW spoke with patient's niece, Burna MortimerWanda at bedside re: discharge planning. [facility] and plans to return there at discharge. Niece inquired about [facility] & private duty caregivers. CSW provided brochure & information on [facility] along with private duty caregiver list. Niece appreciative of CSW visit, anticipating possible discharge this afternoon.   No further CSW needs identified - CSW signing off.   Lincoln MaxinKelly Timberlynn Kizziah, LCSW [facility] Clinical Social Worker cell #: [phone]

## 2013-07-28 NOTE — Progress Notes (Signed)
INITIAL NUTRITION ASSESSMENT  DOCUMENTATION CODES Per approved criteria  -Not Applicable   INTERVENTION: -Recommend Ensure Complete po BID, each supplement provides 350 kcal and 13 grams of protein -Provided family nutrition supplement coupons -Will continue to monitor  NUTRITION DIAGNOSIS: Inadequate oral intake related to dementia/dislike of foods available as evidenced by decreased PO intake, wt loss.   Goal: Pt to meet >/= 90% of their estimated nutrition needs    Monitor:  Total protein/energy intake, labs, weights  Reason for Assessment: MST  80 y.o. female  Admitting Dx: TIA (transient ischemic attack)  ASSESSMENT: [age] year old female with past medical history of dementia, from an independent living facility, history of hypertension who [facility] with new onset lethargy and confusion  -Pt's family reported pt w/decreased appetite pta d/t dislike of ILF food availability. Has been refusing meals. Family considering transferring pt to a different facility if problem continues as they alsohave not been happy with food choicesa -Endorsed an unintentional 10 lbs weight loss. Unable to determine when weight loss began -Pt had been starting to consume Ensure supplements, but family had not been providing on consistent basis d/t cost. Provided family with coupons and reviewed other supplement options -Pt passed bedside swallow, denied need for diet texture modifications -Appetite improving during admit, eating 75-100% of meals  Height: Ht Readings from Last 1 Encounters:  07/27/13 5\' 6"  (1.676 m)    Weight: Wt Readings from Last 1 Encounters:  07/27/13 147 lb 6.4 oz (66.86 kg)    Ideal Body Weight: 130 lbs   % Ideal Body Weight: 113%  Wt Readings from Last 10 Encounters:  07/27/13 147 lb 6.4 oz (66.86 kg)    Usual Body Weight: 157 lbs  % Usual Body Weight: 94%  BMI:  Body mass index is 23.8 kg/(m^2).  Estimated Nutritional Needs: Kcal:  1650-1850 Protein: 70-80 gram Fluid: >/=1700 ml/daily  Skin: WDL  Diet Order: General  EDUCATION NEEDS: -No education needs identified at this time   Intake/Output Summary (Last 24 hours) at 07/28/13 1154 Last data filed at 07/28/13 0840  Gross per 24 hour  Intake    780 ml  Output      0 ml  Net    780 ml    Last BM: pta   Labs:   Recent Labs Lab 07/26/13 1158  NA 137  K 4.1  CL 100  CO2 24  BUN 21  CREATININE 1.08  CALCIUM 9.1  GLUCOSE 147*    CBG (last 3)  No results found for this basename: GLUCAP,  in the last 72 hours  Scheduled Meds: .  stroke: mapping our early stages of recovery book   Does not apply Once  . amLODipine  5 mg Oral Daily  . aspirin  81 mg Oral Daily  . memantine  5 mg Oral BID  . risperiDONE  0.25 mg Oral q morning - 10a  . risperiDONE  0.5 mg Oral QHS  . simvastatin  10 mg Oral QHS  . traZODone  150 mg Oral QHS    Continuous Infusions:   Past Medical History  Diagnosis Date  . Hypertension   . Frontal lobe dementia     History reviewed. No pertinent past surgical history.  Lloyd HugerSarah F Ledonna Dormer MS RD LDN Clinical Dietitian Pager:[phone]

## 2013-07-28 NOTE — Progress Notes (Signed)
Physical Therapy Treatment Patient Details Name: Marissa Chung MRN: [id number] DOB: 06-Sep-1933 Today's Date: 07/28/2013    History of Present Illness 80 year old female with past medical history of dementia, from an independent living facility, history of hypertension who [facility] with new onset lethargy and confusion.    PT Comments    Continuing to progress with mobility. LOB x 1 during session requiring external assist to correct. Feel pt may benefit from HHPT to ensure safe mobility in home environment and for higher level balance training.   Follow Up Recommendations  Home health PT     Equipment Recommendations  None recommended by PT    Recommendations for Other Services       Precautions / Restrictions Precautions Precautions: Fall    Mobility  Bed Mobility Overal bed mobility: Modified Independent                Transfers Overall transfer level: Modified independent                  Ambulation/Gait Ambulation/Gait assistance: Min guard;Min assist Ambulation Distance (Feet): 1000 Feet Assistive device: None Gait Pattern/deviations: Step-through pattern     General Gait Details: LOb x 1 initially requiring external assist to correct. Also noted a couple of instances on instability while ambulating in hallway. One instance of pt not clearing door frame-brushed shoulder.    Stairs            Wheelchair Mobility    Modified Rankin (Stroke Patients Only)       Balance                                    Cognition Arousal/Alertness: Awake/alert Behavior During Therapy: WFL for tasks assessed/performed Overall Cognitive Status: History of cognitive impairments - at baseline                      Exercises General Exercises - Lower Extremity Long Arc Quad: AROM;Both;10 reps;Seated Hip ABduction/ADduction: AROM;Both;10 reps;Standing Hip Flexion/Marching: AROM;Both;10 reps;Standing Heel  Raises: AROM;Both;10 reps;Standing    General Comments        Pertinent Vitals/Pain Pt denied pain    Home Living                      Prior Function            PT Goals (current goals can now be found in the care plan section) Progress towards PT goals: Progressing toward goals    Frequency  Min 3X/week    PT Plan Current plan remains appropriate    Co-evaluation             End of Session Equipment Utilized During Treatment: Gait belt Activity Tolerance: Patient tolerated treatment well Patient left: in chair;with call bell/phone within reach;with chair alarm set;with family/visitor present     Time: 1191-47821102-1119 PT Time Calculation (min): 17 min  Charges:  $Gait Training: 8-22 mins                    G Codes:      Marissa Chung, MPT Pager: [phone]

## 2013-07-28 NOTE — Progress Notes (Signed)
RN tried to wake up pt for neuro checks for 0000 but pt gets agitated and refusing. Explained to the patient but she still refused and stated "leave me alone I want to sleep". Pt is not compliant on getting her temp either. But RN manage to get her BP,RR,PR and o2sat.  Will continue to monitor.

## 2013-07-28 NOTE — Evaluation (Signed)
SLP Cancellation Note  Patient Details Name: Marissa Chung MRN: [id number] DOB: [birth year]   Cancelled treatment:       Reason Eval/Treat Not Completed:  (dc paperwork done per RN, per RN pt without cognitive linguistic deficits/ TIA, please order speech eval if needed)   Donavan Burnetamara Maureena Dabbs, MS [facility] SLP [phone]

## 2013-07-28 NOTE — Evaluation (Signed)
Occupational Therapy Evaluation Patient Details Name: Marissa Chung MRN: [id number] DOB: [birth year] Today's Date: 07/28/2013    History of Present Illness 80 year old female with past medical history of dementia, from an independent living facility, history of hypertension who [facility]26/2013 with new onset lethargy and confusion.   Clinical Impression   Pt presents to OT with decreased I with ADL activity due to problems listed below. Pt will benefit from skilled OT increase I with ADL activity and return to PLOF    Follow Up Recommendations  Home health OT;Supervision - Intermittent    Equipment Recommendations  None recommended by OT       Precautions / Restrictions Precautions Precautions: Fall      Mobility Bed Mobility Overal bed mobility: Modified Independent                Transfers Overall transfer level: Needs assistance   Transfers: Sit to/from Stand Sit to Stand: Supervision                   ADL Overall ADL's : Needs assistance/impaired Eating/Feeding: Set up;Sitting   Grooming: Sitting;Set up   Upper Body Bathing: Set up;Sitting   Lower Body Bathing: Minimal assistance;Sit to/from stand   Upper Body Dressing : Set up;Sitting   Lower Body Dressing: Minimal assistance;Sit to/from stand       Toileting- ArchitectClothing Manipulation and Hygiene: Minimal assistance;Sit to/from stand       Functional mobility during ADLs: Min guard General ADL Comments: Niece reports hiring A to help pt approx 5 hours a day.              Extremity/Trunk Assessment Upper Extremity Assessment Upper Extremity Assessment: Overall WFL for tasks assessed           Communication     Cognition Arousal/Alertness: Awake/alert Behavior During Therapy: WFL for tasks assessed/performed Overall Cognitive Status: History of cognitive impairments - at baseline                     General Comments   Niece setting up care for home           Home Living Family/patient expects to be discharged to:: Private residence Living Arrangements: Alone   Type of Home: Independent living facility             Bathroom Shower/Tub: Producer, television/film/videoWalk-in shower   Bathroom Toilet: Standard     Home Equipment: None          Prior Functioning/Environment               OT Diagnosis: Generalized weakness   OT Problem List: Decreased strength;Decreased activity tolerance   OT Treatment/Interventions: Self-care/ADL training;Patient/family education    OT Goals(Current goals can be found in the care plan section) Acute Rehab OT Goals OT Goal Formulation: With patient Time For Goal Achievement: 07/28/13 Potential to Achieve Goals: Good  OT Frequency: Min 2X/week    End of Session Nurse Communication: Mobility status  Activity Tolerance: Patient tolerated treatment well Patient left: in chair;with call bell/phone within reach;with chair alarm set   Time: 1255-1310 OT Time Calculation (min): 15 min Charges:  OT General Charges $OT Visit: 1 Procedure OT Evaluation $Initial OT Evaluation Tier I: 1 Procedure OT Treatments $Self Care/Home Management : 8-22 mins G-Codes:    Einar CrowEDDING, Yandel Zeiner D 07/28/2013, 1:36 PM

## 2013-07-28 NOTE — Discharge Summary (Signed)
Physician Discharge Summary  Marissa Chung ZOX:[id number] DOB: 1933-04-17 DOA: 07/26/2013  PCP: Leanor Rubenstein, MD  Admit date: 07/26/2013 Discharge date: 07/28/2013  Time spent: 30 minutes  Recommendations for Outpatient Follow-up:  1. Discharge to independent living with HHPT 2. Followup with PCP in one week  Discharge Diagnoses:  Principal Problem:  Acute encephalopathy possibly due to TIA (transient ischemic attack)  Active Problems:   Accelerated hypertension   Dementia   Anemia   Dyslipidemia Malnutrition   Discharge Condition: Fair  Diet recommendation: Cardiac with the additional supplement  Filed Weights   07/26/13 1939 07/27/13 1713  Weight: 66.6 kg (146 lb 13.2 oz) 66.86 kg (147 lb 6.4 oz)    History of present illness:  80 year old female with past medical history of dementia, from an independent living facility (ILF), history of hypertension who presented to [facility] ED 07/26/2013 with new onset lethargy and confusion. Patient's niece came to visit her at ILF and noticed that her aunt very lethargic, confused and had hard time speaking.  In ED, vitals were stable except for acceded hypertension systolic blood pressure in 200s.. CT head did not show acute intracranial findings. She was admitted for TIA/CVA work up. Due to accelerated hypertension she was admitted to SDU.    Hospital Course:  Acute encephalopathy Possibly due to underlying TIA versus hypertensive encephalopathy versus worsening of her dementia. As per her family she was noted to have a right-sided facial droop at the independent living. Head CT was unremarkable. Patient given full dose of aspirin on admission. She cleared slowly evaluation. Her mental status started to improve. MRI of the brain done was negative for acute ischemia or infarct. However they did comment on mild diffuse interval thickening and mild cerebellar tonsillar ectopia which could be possible in the setting of intracranial hypotension  or infectious meningitis. Given lack of infectious symptoms and improvement in her blood pressure no further workup was considered. MRA of the head was unremarkable The echo done showed normal EF with mild diastolic dysfunction. Carotid Doppler showed nonsignificant stenosis. -Lipid panel was normal and hemoglobin A1c of 6.1. -Patient's mental status seems stable at baseline. She will be discharged on baby aspirin and continue statin.  Accelerated hypertension Patient placed on scheduled and as needed hydralazine. Lipitor episode of low blood pressure last 24 hours. I would resume her home dose of lisinopril and not add further blood pressure medication. Her blood pressure should be followed up as outpatient and medication adjusted as necessary.  Anemia of chronic disease.  Clinically stable  Dementia Continue memantine and risperidone  Loss of appetite and weight loss. -Reported to have more than 10 pound weight loss over past few months . Seen by nutrition consult and recommended Ensure twice a day. I will add Megace 40 mg once daily for appetite stimulation.  Patient seen by physical therapy and recommended home health PT . She  is stable to be discharged to independent living.  Procedures:  Head CT  MRI brain  2-D echo and carotid Doppler  Consultations: None Discharge Exam: Filed Vitals:   07/28/13 0510  BP: 104/43  Pulse: 72  Temp:   Resp: 16    General: Elderly female in no acute distress HEENT: No pallor, moist oral mucosa Chest: Clear to auscultation bilaterally, no added sounds : Normal S1-S2, no murmurs Abdomen: Soft, nontender, nondistended, bowel sounds present Extremities: Warm, no edema CNS: AAO x2   Discharge Instructions You were cared for by a hospitalist during your hospital  stay. If you have any questions about your discharge medications or the care you received while you were in the hospital after you are discharged, you can call the unit and asked  to speak with the hospitalist on call if the hospitalist that took care of you is not available. Once you are discharged, your primary care physician will handle any further medical issues. Please note that NO REFILLS for any discharge medications will be authorized once you are discharged, as it is imperative that you return to your primary care physician (or establish a relationship with a primary care physician if you do not have one) for your aftercare needs so that they can reassess your need for medications and monitor your lab values.     Medication List         aspirin 81 MG chewable tablet  Chew 1 tablet (81 mg total) by mouth daily.     feeding supplement (ENSURE COMPLETE) Liqd  Take 237 mLs by mouth 2 (two) times daily between meals.     lisinopril 20 MG tablet  Commonly known as:  PRINIVIL,ZESTRIL  Take 1 tablet (20 mg total) by mouth daily.     megestrol 40 MG tablet  Commonly known as:  MEGACE  Take 1 tablet (40 mg total) by mouth daily.     memantine 5 MG tablet  Commonly known as:  NAMENDA  Take 5 mg by mouth 2 (two) times daily.     risperiDONE 0.25 MG tablet  Commonly known as:  RISPERDAL  Take 0.25-0.5 mg by mouth 2 (two) times daily. .25 mg am and .50 mg at bedtime     simvastatin 10 MG tablet  Commonly known as:  ZOCOR  Take 10 mg by mouth at bedtime.     traZODone 150 MG tablet  Commonly known as:  DESYREL  Take 150 mg by mouth at bedtime.       Allergies  Allergen Reactions  . Shellfish Allergy Hives       Follow-up Information   Follow up with Leanor Rubenstein, MD. Schedule an appointment as soon as possible for a visit in 1 week.   Specialty:  Family Medicine   Contact information:   [address] [place] [address] [phone]        The results of significant diagnostics from this hospitalization (including imaging, microbiology, ancillary and laboratory) are listed below for reference.    Significant Diagnostic  Studies: Ct Head Wo Contrast  07/26/2013   CLINICAL DATA:  Weakness and right facial droop.  EXAM: CT HEAD WITHOUT CONTRAST  TECHNIQUE: Contiguous axial images were obtained from the base of the skull through the vertex without intravenous contrast.  COMPARISON:  Head CT 04/25/2013.  FINDINGS: No acute intracranial abnormalities. Specifically, no evidence of acute intracranial hemorrhage, no definite findings of acute/subacute cerebral ischemia, no mass, mass effect, hydrocephalus or abnormal intra or extra-axial fluid collections. Visualized paranasal sinuses and mastoids are well pneumatized, with exception of the left sphenoid sinus which remains chronically opacified. No acute displaced skull fractures are identified.  IMPRESSION: 1. No acute intracranial abnormalities. 2. 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   Electronically Signed   By: Trudie Reed M.D.   On: 07/26/2013 14:21   Mr Maxine Glenn Head Wo Contrast  07/27/2013   ADDENDUM REPORT: 07/27/2013 12:24  ADDENDUM: Mild MCA and PCA branch vessel irregularity may be artifactual due to motion or reflect mild atherosclerosis.  Electronically Signed   By: Sebastian Ache   On: 07/27/2013 12:24   07/27/2013   CLINICAL DATA:  Facial droop, aphasia, and confusion.  Stroke.  EXAM: MRI HEAD WITHOUT CONTRAST  MRA HEAD WITHOUT CONTRAST  TECHNIQUE: Multiplanar, multiecho pulse sequences of the brain and surrounding structures were obtained without intravenous contrast. Angiographic images of the head were obtained using MRA technique without contrast.  COMPARISON:  Head CT 07/26/2013  FINDINGS: MRI HEAD FINDINGS  Incidental note is made of a short clivus with mild extension of the cerebellar tonsils through the foramen magnum of approximately 6 mm. The tonsils retain a rounded shape.  There is no evidence of acute infarct, intracranial hemorrhage, mass, midline shift, or extra-axial fluid collection. There is evidence of  mild, diffuse dural thickening bilaterally. Patchy T2 hyperintensities in the subcortical and deep cerebral white matter are nonspecific but compatible with moderate chronic small vessel ischemic disease. Mild, age appropriate cerebral atrophy is present. Orbits are unremarkable. Left sphenoid sinus mucosal thickening is noted. Trace right mastoid fluid is present. Major intracranial vascular flow voids are preserved.  MRA HEAD FINDINGS  There is mild motion artifact. Visualized distal vertebral arteries are patent with the left being dominant. PICA origins are patent. SCA origins are patent. Basilar artery is patent without stenosis. There is a small right posterior communicating artery. The PCAs are unremarkable aside from mild distal branch vessel irregularity.  Internal carotid arteries are patent from skullbase to carotid termini. ACAs are unremarkable. MCAs are unremarkable aside from very mild distal branch vessel irregularity, left greater than right. No intracranial aneurysm is identified.  IMPRESSION: 1. No evidence of acute/subacute infarct. Moderate chronic small vessel ischemic disease. 2. Mild, diffuse dural thickening and mild cerebellar tonsillar ectopia. This is of unclear significance, however intracranial hypotension is a consideration. Has the patient had a recent lumbar puncture? Other considerations might include tiny bilateral, chronic subdural hematomas/ hygromas, dural thickening due to old bilateral subdural hematomas, and infectious or idiopathic pachymeningitis. Consider obtaining a postcontrast brain MRI and performing a lumbar puncture to assess for intracranial hypotension and infection if these are of clinical concern. 3. Unremarkable head MRA.  Electronically Signed: By: Sebastian Ache On: 07/27/2013 12:14   Mr Brain Wo Contrast  07/27/2013   ADDENDUM REPORT: 07/27/2013 12:24  ADDENDUM: Mild MCA and PCA branch vessel irregularity may be artifactual due to motion or reflect mild  atherosclerosis.   Electronically Signed   By: Sebastian Ache   On: 07/27/2013 12:24   07/27/2013   CLINICAL DATA:  Facial droop, aphasia, and confusion.  Stroke.  EXAM: MRI HEAD WITHOUT CONTRAST  MRA HEAD WITHOUT CONTRAST  TECHNIQUE: Multiplanar, multiecho pulse sequences of the brain and surrounding structures were obtained without intravenous contrast. Angiographic images of the head were obtained using MRA technique without contrast.  COMPARISON:  Head CT 07/26/2013  FINDINGS: MRI HEAD FINDINGS  Incidental note is made of a short clivus with mild extension of the cerebellar tonsils through the foramen magnum of approximately 6 mm. The tonsils retain a rounded shape.  There is no evidence of acute infarct, intracranial hemorrhage, mass, midline shift, or extra-axial fluid collection. There is evidence of mild, diffuse dural thickening bilaterally. Patchy T2 hyperintensities in the subcortical and deep cerebral white matter are nonspecific but compatible with moderate chronic small vessel ischemic disease. Mild, age appropriate cerebral atrophy is present. Orbits are unremarkable. Left sphenoid sinus mucosal thickening is noted. Trace right mastoid fluid is present. Major intracranial vascular flow  voids are preserved.  MRA HEAD FINDINGS  There is mild motion artifact. Visualized distal vertebral arteries are patent with the left being dominant. PICA origins are patent. SCA origins are patent. Basilar artery is patent without stenosis. There is a small right posterior communicating artery. The PCAs are unremarkable aside from mild distal branch vessel irregularity.  Internal carotid arteries are patent from skullbase to carotid termini. ACAs are unremarkable. MCAs are unremarkable aside from very mild distal branch vessel irregularity, left greater than right. No intracranial aneurysm is identified.  IMPRESSION: 1. No evidence of acute/subacute infarct. Moderate chronic small vessel ischemic disease. 2. Mild,  diffuse dural thickening and mild cerebellar tonsillar ectopia. This is of unclear significance, however intracranial hypotension is a consideration. Has the patient had a recent lumbar puncture? Other considerations might include tiny bilateral, chronic subdural hematomas/ hygromas, dural thickening due to old bilateral subdural hematomas, and infectious or idiopathic pachymeningitis. Consider obtaining a postcontrast brain MRI and performing a lumbar puncture to assess for intracranial hypotension and infection if these are of clinical concern. 3. Unremarkable head MRA.  Electronically Signed: By: Sebastian AcheAllen  Grady On: 07/27/2013 12:14    Microbiology: Recent Results (from the past 240 hour(s))  URINE CULTURE     Status: None   Collection Time    07/26/13  4:24 PM      Result Value Ref Range Status   Specimen Description URINE, CLEAN CATCH   Final   Special Requests Normal   Final   Culture  Setup Time     Final   Value: 07/26/2013 21:20     Performed at [facility]   Colony Count     Final   Value: 25,000 COLONIES/ML     Performed at [facility]   Culture     Final   Value: Multiple bacterial morphotypes present, none predominant. Suggest appropriate recollection if clinically indicated.     Performed at [facility]   Report Status 07/27/2013 FINAL   Final  MRSA PCR SCREENING     Status: None   Collection Time    07/26/13  7:39 PM      Result Value Ref Range Status   MRSA by PCR NEGATIVE  NEGATIVE Final   Comment:            The GeneXpert MRSA Assay (FDA     approved for NASAL specimens     only), is one component of a     comprehensive MRSA colonization     surveillance program. It is not     intended to diagnose MRSA     infection nor to guide or     monitor treatment for     MRSA infections.     Labs: Basic Metabolic Panel:  Recent Labs Lab 07/26/13 1158  NA 137  K 4.1  CL 100  CO2 24  GLUCOSE 147*  BUN 21  CREATININE 1.08  CALCIUM 9.1    Liver Function Tests:  Recent Labs Lab 07/26/13 1158  AST 15  ALT 11  ALKPHOS 57  BILITOT 0.5  PROT 6.6  ALBUMIN 3.3*   No results found for this basename: LIPASE, AMYLASE,  in the last 168 hours No results found for this basename: AMMONIA,  in the last 168 hours CBC:  Recent Labs Lab 07/26/13 1158  WBC 5.0  NEUTROABS 3.5  HGB 10.4*  HCT 32.0*  MCV 87.0  PLT 181   Cardiac Enzymes: No results found for this basename: CKTOTAL,  CKMB, CKMBINDEX, TROPONINI,  in the last 168 hours BNP: BNP (last 3 results) No results found for this basename: PROBNP,  in the last 8760 hours CBG: No results found for this basename: GLUCAP,  in the last 168 hours     Signed:  Eddie North  [facility] 07/28/2013, 2:13 PM

## 2013-08-25 ENCOUNTER — Encounter (HOSPITAL_COMMUNITY): Payer: Self-pay | Admitting: Emergency Medicine

## 2013-08-25 ENCOUNTER — Emergency Department (HOSPITAL_COMMUNITY)
Admission: EM | Admit: 2013-08-25 | Discharge: 2013-08-26 | Disposition: A | Discharge status: Home / Self Care | Payer: Medicare Other | Source: Home / Self Care | Attending: Emergency Medicine | Admitting: Emergency Medicine

## 2013-08-25 DIAGNOSIS — F0390 Unspecified dementia without behavioral disturbance: Secondary | ICD-10-CM

## 2013-08-25 DIAGNOSIS — Z7982 Long term (current) use of aspirin: Secondary | ICD-10-CM | POA: Insufficient documentation

## 2013-08-25 DIAGNOSIS — F29 Unspecified psychosis not due to a substance or known physiological condition: Secondary | ICD-10-CM | POA: Diagnosis not present

## 2013-08-25 DIAGNOSIS — I10 Essential (primary) hypertension: Secondary | ICD-10-CM | POA: Insufficient documentation

## 2013-08-25 DIAGNOSIS — R031 Nonspecific low blood-pressure reading: Secondary | ICD-10-CM | POA: Diagnosis not present

## 2013-08-25 DIAGNOSIS — R5383 Other fatigue: Secondary | ICD-10-CM

## 2013-08-25 DIAGNOSIS — Z79899 Other long term (current) drug therapy: Secondary | ICD-10-CM | POA: Insufficient documentation

## 2013-08-25 DIAGNOSIS — R5381 Other malaise: Secondary | ICD-10-CM | POA: Insufficient documentation

## 2013-08-25 LAB — CBC WITH DIFFERENTIAL/PLATELET
BASOS ABS: 0 10*3/uL (ref 0.0–0.1)
Basophils Relative: 0 % (ref 0–1)
Eosinophils Absolute: 0.1 10*3/uL (ref 0.0–0.7)
Eosinophils Relative: 1 % (ref 0–5)
HEMATOCRIT: 33.8 % — AB (ref 36.0–46.0)
Hemoglobin: 11.1 g/dL — ABNORMAL LOW (ref 12.0–15.0)
LYMPHS PCT: 26 % (ref 12–46)
Lymphs Abs: 1.5 10*3/uL (ref 0.7–4.0)
MCH: 28.2 pg (ref 26.0–34.0)
MCHC: 32.8 g/dL (ref 30.0–36.0)
MCV: 86 fL (ref 78.0–100.0)
MONO ABS: 0.4 10*3/uL (ref 0.1–1.0)
MONOS PCT: 7 % (ref 3–12)
NEUTROS ABS: 3.8 10*3/uL (ref 1.7–7.7)
Neutrophils Relative %: 66 % (ref 43–77)
Platelets: 153 10*3/uL (ref 150–400)
RBC: 3.93 MIL/uL (ref 3.87–5.11)
RDW: 14.1 % (ref 11.5–15.5)
WBC: 5.8 10*3/uL (ref 4.0–10.5)

## 2013-08-25 LAB — BASIC METABOLIC PANEL
Anion gap: 14 (ref 5–15)
BUN: 15 mg/dL (ref 6–23)
CALCIUM: 9.4 mg/dL (ref 8.4–10.5)
CHLORIDE: 105 meq/L (ref 96–112)
CO2: 22 meq/L (ref 19–32)
CREATININE: 0.85 mg/dL (ref 0.50–1.10)
GFR calc Af Amer: 74 mL/min — ABNORMAL LOW (ref >90)
GFR calc non Af Amer: 64 mL/min — ABNORMAL LOW (ref >90)
Glucose, Bld: 143 mg/dL — ABNORMAL HIGH (ref 70–99)
Potassium: 3.8 mEq/L (ref 3.7–5.3)
Sodium: 141 mEq/L (ref 137–147)

## 2013-08-25 MED ORDER — HYDROCHLOROTHIAZIDE 12.5 MG PO CAPS
12.5000 mg | ORAL_CAPSULE | Freq: Once | ORAL | Status: AC
  Administered 2013-08-25: 12.5 mg via ORAL
  Filled 2013-08-25: qty 1

## 2013-08-25 MED ORDER — HYDROCHLOROTHIAZIDE 12.5 MG PO TABS: 12.5000 mg | ORAL_TABLET | Freq: Every day | ORAL | Status: DC

## 2013-08-25 NOTE — ED Notes (Signed)
Patient presents for HTN. Daughter reports home health nurse recorded blood pressure's of 200+ systolic. Patient reports dizziness earlier today. Also reports "feeling sluggish" earlier. Patient denies dizziness at this time.

## 2013-08-25 NOTE — Discharge Instructions (Signed)
Please read and follow all provided instructions.  Your diagnoses today include:  1. Essential hypertension   2. Dementia, without behavioral disturbance    Your blood pressure was high today (BP): BP 183/88   Pulse 83   Temp(Src) 98.9 F (37.2 C) (Oral)   Resp 16   SpO2 100%  Tests performed today include:  Vital signs. See below for your results today.   Medications prescribed:   HCTZ - medication to help lower blood pressure. This is a low dose. You should see your doctor in the next week to have them decide whether or not to continue or change this medication.   Home care instructions:  Follow any educational materials contained in this packet.  Follow-up instructions: Please follow-up with your primary care provider in the next 3 days for a recheck of your symptoms and blood pressure.    Return instructions:   Please return to the Emergency Department if you experience worsening symptoms.   Return with severe chest pain, abdominal pain, or shortness of breath.   Return with severe headache, focal weakness, numbness, difficulty with speech or vision.  Return with loss of vision or sudden vision change.  Please return if you have any other emergent concerns.  Additional Information:  Your vital signs today were: BP 183/88   Pulse 83   Temp(Src) 98.9 F (37.2 C) (Oral)   Resp 16   SpO2 100% If your blood pressure (BP) was elevated above 135/85 this visit, please have this repeated by your doctor within one month. --------------

## 2013-08-25 NOTE — ED Provider Notes (Signed)
CSN: [id number]     Arrival date & time 08/25/13  1705 History   First MD Initiated Contact with Patient 08/25/13 2036     Chief Complaint  Patient presents with  . Hypertension     (Consider location/radiation/quality/duration/timing/severity/associated sxs/prior Treatment) HPI Comments: Patient with history of dementia, possible TIA, hypertension -- presents with complaint of elevated blood pressures today. Patient has home health aide who helps give patient medications as well as checks blood pressure during the day. Systolic blood pressure was noted to be greater than 200 on 2 occasions today. Patient states that she felt sluggish this morning but was otherwise asymptomatic this afternoon. She denies current headache or signs of stroke including: facial droop, slurred speech, aphasia, weakness/numbness in extremities, imbalance/trouble walking. She denies vision loss or change in vision. She denies chest pain, shortness of breath, swelling of extremities. She denies change in urination. Patient is on 20 mg of lisinopril in the morning for her blood pressure currently. Patient was scheduled to see her primary care physician today but came to the emergency department instead. Daughter is at bedside and states that the patient is at her current baseline. No other concerns voiced.   Patient is a 80 y.o. female presenting with hypertension. The history is provided by the patient, medical records and a relative.  Hypertension Associated symptoms include fatigue. Pertinent negatives include no abdominal pain, chest pain, congestion, coughing, fever, headaches, myalgias, nausea, neck pain, numbness, rash, sore throat, vomiting or weakness.    Past Medical History  Diagnosis Date  . Hypertension   . Frontal lobe dementia    History reviewed. No pertinent past surgical history. No family history on file. History  Substance Use Topics  . Smoking status: Never Smoker   . Smokeless tobacco: Not on  file  . Alcohol Use: No   OB History   Grav Para Term Preterm Abortions TAB SAB Ect Mult Living                 Review of Systems  Constitutional: Positive for fatigue. Negative for fever.  HENT: Negative for congestion, dental problem, rhinorrhea, sinus pressure and sore throat.   Eyes: Negative for photophobia, discharge, redness and visual disturbance.  Respiratory: Negative for cough and shortness of breath.   Cardiovascular: Negative for chest pain.  Gastrointestinal: Negative for nausea, vomiting, abdominal pain and diarrhea.  Genitourinary: Negative for dysuria and difficulty urinating.  Musculoskeletal: Negative for gait problem, myalgias, neck pain and neck stiffness.  Skin: Negative for rash.  Neurological: Negative for syncope, speech difficulty, weakness, light-headedness, numbness and headaches.  Psychiatric/Behavioral: Negative for confusion.      Allergies  Shellfish allergy  Home Medications   Prior to Admission medications   Medication Sig Start Date End Date Taking? Authorizing Provider  aspirin 81 MG chewable tablet Chew 1 tablet (81 mg total) by mouth daily. 07/28/13  Yes Nishant Dhungel, MD  feeding supplement, ENSURE COMPLETE, (ENSURE COMPLETE) LIQD Take 237 mLs by mouth 2 (two) times daily between meals. 07/28/13  Yes Nishant Dhungel, MD  lisinopril (PRINIVIL,ZESTRIL) 20 MG tablet Take 1 tablet (20 mg total) by mouth daily. 04/25/13  Yes Richardean Canal, MD  megestrol (MEGACE) 40 MG tablet Take 1 tablet (40 mg total) by mouth daily. 07/28/13  Yes Nishant Dhungel, MD  memantine (NAMENDA) 5 MG tablet Take 5 mg by mouth 2 (two) times daily.   Yes Historical Provider, MD  risperiDONE (RISPERDAL) 0.25 MG tablet Take 0.25-0.5 mg by mouth 2 (two)  times daily. .25 mg am and .50 mg at bedtime   Yes Historical Provider, MD  simvastatin (ZOCOR) 10 MG tablet Take 10 mg by mouth at bedtime.   Yes Historical Provider, MD  traZODone (DESYREL) 150 MG tablet Take 150 mg by mouth  at bedtime.   Yes Historical Provider, MD   BP 202/93  Pulse 95  Temp(Src) 98.9 F (37.2 C) (Oral)  Resp 18  SpO2 100%  Physical Exam  Nursing note and vitals reviewed. Constitutional: She is oriented to person, place, and time. She appears well-developed and well-nourished.  HENT:  Head: Normocephalic and atraumatic.  Right Ear: Tympanic membrane, external ear and ear canal normal.  Left Ear: Tympanic membrane, external ear and ear canal normal.  Nose: Nose normal.  Mouth/Throat: Uvula is midline, oropharynx is clear and moist and mucous membranes are normal.  Eyes: Conjunctivae, EOM and lids are normal. Pupils are equal, round, and reactive to light. Right eye exhibits no nystagmus. Left eye exhibits no nystagmus.  Neck: Normal range of motion. Neck supple.  Cardiovascular: Normal rate and regular rhythm.   Pulmonary/Chest: Effort normal and breath sounds normal.  Abdominal: Soft. There is no tenderness.  Musculoskeletal:       Cervical back: She exhibits normal range of motion, no tenderness and no bony tenderness.  Neurological: She is alert and oriented to person, place, and time. She has normal strength and normal reflexes. No cranial nerve deficit or sensory deficit. She displays a negative Romberg sign. Coordination and gait normal. GCS eye subscore is 4. GCS verbal subscore is 5. GCS motor subscore is 6.  Skin: Skin is warm and dry.  Psychiatric: She has a normal mood and affect.    ED Course  Procedures (including critical care time) Labs Review Labs Reviewed  CBC WITH DIFFERENTIAL - Abnormal; Notable for the following:    Hemoglobin 11.1 (*)    HCT 33.8 (*)    All other components within normal limits  BASIC METABOLIC PANEL - Abnormal; Notable for the following:    Glucose, Bld 143 (*)    GFR calc non Af Amer 64 (*)    GFR calc Af Amer 74 (*)    All other components within normal limits    Imaging Review No results found.   EKG Interpretation None       9:05 PM Patient seen and examined. Work-up initiated. Medications ordered.   Vital signs reviewed and are as follows: BP 202/93  Pulse 95  Temp(Src) 98.9 F (37.2 C) (Oral)  Resp 18  SpO2 100%  9:09 PM Patient was discussed with Samuel Jester, DO. Reviewed BP from recent hospitalization. If BP is okay here, will start very low dose HCTZ and have patient follow-up with PCP.   11:31 PM HCTZ 12.5mg  given. BP 183/88  Pulse 83  Temp(Src) 98.9 F (37.2 C) (Oral)  Resp 16  SpO2 100%  Pt informed of findings and plan. I have asked her to follow-up with her PCP this week to discuss BP management and for PCP to decide whether or not to continue/increase this medication.   Patient urged to return with worsening symptoms or other concerns. Patient verbalized understanding and agrees with plan.   MDM   Final diagnoses:  Essential hypertension  Dementia, without behavioral disturbance   Patient with HTN -- here tonight because BP readings were high, not because she was having symptoms associated with elevated blood pressure. Right now she is asymptomatic and feels well. There are no gross  signs of end organ damage. Her mental status is at baseline per her daughter. She does not have severe headache or any symptoms of hemorrhagic stroke or intracranial bleed. No vision change or loss. No chest pain, shortness of breath, signs of pulmonary edema. Creatinine is at baseline. Will start low-dose HCTZ and have close PCP for BP management.   No dangerous or life-threatening conditions suspected or identified by history, physical exam, and by work-up. No indications for hospitalization identified.     Renne Crigler, PA-C 08/25/13 2334

## 2013-08-25 NOTE — ED Notes (Signed)
Pt daughter would like to be called if/when Pt is discharged or admitted.   Moises Blood [phone]

## 2013-08-25 NOTE — ED Notes (Signed)
Pt c/o HTN, pt denies n/v/d. Was dizzy earlier but none at the moment. States BP was 212/110 at home.

## 2013-08-27 ENCOUNTER — Emergency Department (HOSPITAL_COMMUNITY): Payer: Medicare Other

## 2013-08-27 ENCOUNTER — Encounter (HOSPITAL_COMMUNITY): Payer: Self-pay | Admitting: Emergency Medicine

## 2013-08-27 ENCOUNTER — Inpatient Hospital Stay (HOSPITAL_COMMUNITY)
Admission: EM | Admit: 2013-08-27 | Discharge: 2013-08-31 | DRG: 315 | Disposition: A | Discharge status: Skilled Nursing Facility | Payer: Medicare Other | Attending: Internal Medicine | Admitting: Internal Medicine

## 2013-08-27 DIAGNOSIS — E869 Volume depletion, unspecified: Secondary | ICD-10-CM | POA: Diagnosis present

## 2013-08-27 DIAGNOSIS — G3109 Other frontotemporal dementia: Secondary | ICD-10-CM

## 2013-08-27 DIAGNOSIS — F29 Unspecified psychosis not due to a substance or known physiological condition: Secondary | ICD-10-CM | POA: Diagnosis present

## 2013-08-27 DIAGNOSIS — F0390 Unspecified dementia without behavioral disturbance: Secondary | ICD-10-CM

## 2013-08-27 DIAGNOSIS — R031 Nonspecific low blood-pressure reading: Secondary | ICD-10-CM | POA: Diagnosis present

## 2013-08-27 DIAGNOSIS — G9340 Encephalopathy, unspecified: Secondary | ICD-10-CM | POA: Diagnosis present

## 2013-08-27 DIAGNOSIS — Z7982 Long term (current) use of aspirin: Secondary | ICD-10-CM

## 2013-08-27 DIAGNOSIS — R7309 Other abnormal glucose: Secondary | ICD-10-CM | POA: Diagnosis present

## 2013-08-27 DIAGNOSIS — F03918 Unspecified dementia, unspecified severity, with other behavioral disturbance: Secondary | ICD-10-CM | POA: Diagnosis present

## 2013-08-27 DIAGNOSIS — I10 Essential (primary) hypertension: Secondary | ICD-10-CM

## 2013-08-27 DIAGNOSIS — F0280 Dementia in other diseases classified elsewhere without behavioral disturbance: Secondary | ICD-10-CM | POA: Diagnosis present

## 2013-08-27 DIAGNOSIS — I959 Hypotension, unspecified: Secondary | ICD-10-CM | POA: Diagnosis present

## 2013-08-27 DIAGNOSIS — R410 Disorientation, unspecified: Secondary | ICD-10-CM | POA: Diagnosis present

## 2013-08-27 DIAGNOSIS — F0391 Unspecified dementia with behavioral disturbance: Secondary | ICD-10-CM

## 2013-08-27 DIAGNOSIS — R404 Transient alteration of awareness: Secondary | ICD-10-CM

## 2013-08-27 DIAGNOSIS — H269 Unspecified cataract: Secondary | ICD-10-CM | POA: Diagnosis present

## 2013-08-27 DIAGNOSIS — T502X5A Adverse effect of carbonic-anhydrase inhibitors, benzothiadiazides and other diuretics, initial encounter: Secondary | ICD-10-CM | POA: Diagnosis present

## 2013-08-27 HISTORY — DX: Unspecified cataract: H26.9

## 2013-08-27 HISTORY — DX: Personal history of other infectious and parasitic diseases: Z86.19

## 2013-08-27 LAB — CBC WITH DIFFERENTIAL/PLATELET
BASOS ABS: 0 10*3/uL (ref 0.0–0.1)
BASOS PCT: 0 % (ref 0–1)
EOS ABS: 0 10*3/uL (ref 0.0–0.7)
EOS PCT: 0 % (ref 0–5)
HEMATOCRIT: 32 % — AB (ref 36.0–46.0)
HEMOGLOBIN: 10.6 g/dL — AB (ref 12.0–15.0)
Lymphocytes Relative: 10 % — ABNORMAL LOW (ref 12–46)
Lymphs Abs: 0.7 10*3/uL (ref 0.7–4.0)
MCH: 28.2 pg (ref 26.0–34.0)
MCHC: 33.1 g/dL (ref 30.0–36.0)
MCV: 85.1 fL (ref 78.0–100.0)
MONO ABS: 0.6 10*3/uL (ref 0.1–1.0)
MONOS PCT: 8 % (ref 3–12)
Neutro Abs: 6 10*3/uL (ref 1.7–7.7)
Neutrophils Relative %: 82 % — ABNORMAL HIGH (ref 43–77)
Platelets: 149 10*3/uL — ABNORMAL LOW (ref 150–400)
RBC: 3.76 MIL/uL — ABNORMAL LOW (ref 3.87–5.11)
RDW: 14.3 % (ref 11.5–15.5)
WBC: 7.3 10*3/uL (ref 4.0–10.5)

## 2013-08-27 LAB — URINALYSIS, ROUTINE W REFLEX MICROSCOPIC
BILIRUBIN URINE: NEGATIVE
Glucose, UA: NEGATIVE mg/dL
HGB URINE DIPSTICK: NEGATIVE
Ketones, ur: NEGATIVE mg/dL
Leukocytes, UA: NEGATIVE
Nitrite: NEGATIVE
PROTEIN: NEGATIVE mg/dL
Specific Gravity, Urine: 1.012 (ref 1.005–1.030)
Urobilinogen, UA: 1 mg/dL (ref 0.0–1.0)
pH: 6 (ref 5.0–8.0)

## 2013-08-27 LAB — GLUCOSE, CAPILLARY: Glucose-Capillary: 107 mg/dL — ABNORMAL HIGH (ref 70–99)

## 2013-08-27 LAB — COMPREHENSIVE METABOLIC PANEL
ALBUMIN: 3.2 g/dL — AB (ref 3.5–5.2)
ALT: 10 U/L (ref 0–35)
AST: 15 U/L (ref 0–37)
Alkaline Phosphatase: 57 U/L (ref 39–117)
Anion gap: 13 (ref 5–15)
BILIRUBIN TOTAL: 0.4 mg/dL (ref 0.3–1.2)
BUN: 14 mg/dL (ref 6–23)
CALCIUM: 8.8 mg/dL (ref 8.4–10.5)
CO2: 23 mEq/L (ref 19–32)
CREATININE: 0.97 mg/dL (ref 0.50–1.10)
Chloride: 106 mEq/L (ref 96–112)
GFR calc Af Amer: 63 mL/min — ABNORMAL LOW (ref >90)
GFR calc non Af Amer: 55 mL/min — ABNORMAL LOW (ref >90)
Glucose, Bld: 156 mg/dL — ABNORMAL HIGH (ref 70–99)
Potassium: 3.7 mEq/L (ref 3.7–5.3)
Sodium: 142 mEq/L (ref 137–147)
Total Protein: 6.4 g/dL (ref 6.0–8.3)

## 2013-08-27 LAB — HEMOGLOBIN A1C
Hgb A1c MFr Bld: 5.9 % — ABNORMAL HIGH (ref <5.7)
MEAN PLASMA GLUCOSE: 123 mg/dL — AB (ref <117)

## 2013-08-27 LAB — TROPONIN I

## 2013-08-27 LAB — I-STAT TROPONIN, ED: TROPONIN I, POC: 0 ng/mL (ref 0.00–0.08)

## 2013-08-27 MED ORDER — SODIUM CHLORIDE 0.9 % IV BOLUS (SEPSIS): 1000.0000 mL | Freq: Once | INTRAVENOUS | Status: DC

## 2013-08-27 MED ORDER — ONDANSETRON HCL 4 MG/2ML IJ SOLN: 4.0000 mg | Freq: Four times a day (QID) | INTRAMUSCULAR | Status: DC | PRN

## 2013-08-27 MED ORDER — RISPERIDONE 0.5 MG PO TABS
0.5000 mg | ORAL_TABLET | Freq: Every day | ORAL | Status: DC
  Administered 2013-08-27 – 2013-08-30 (×3): 0.5 mg via ORAL
  Filled 2013-08-27 (×7): qty 1

## 2013-08-27 MED ORDER — LISINOPRIL 20 MG PO TABS
20.0000 mg | ORAL_TABLET | Freq: Every day | ORAL | Status: DC
  Administered 2013-08-27 – 2013-08-28 (×2): 20 mg via ORAL
  Filled 2013-08-27 (×3): qty 1

## 2013-08-27 MED ORDER — SIMVASTATIN 10 MG PO TABS
10.0000 mg | ORAL_TABLET | Freq: Every day | ORAL | Status: DC
  Administered 2013-08-27 – 2013-08-30 (×4): 10 mg via ORAL
  Filled 2013-08-27 (×5): qty 1

## 2013-08-27 MED ORDER — RISPERIDONE 0.25 MG PO TABS
0.2500 mg | ORAL_TABLET | Freq: Every day | ORAL | Status: DC
  Administered 2013-08-28 – 2013-08-31 (×4): 0.25 mg via ORAL
  Filled 2013-08-27 (×4): qty 1

## 2013-08-27 MED ORDER — ONDANSETRON HCL 4 MG PO TABS: 4.0000 mg | ORAL_TABLET | Freq: Four times a day (QID) | ORAL | Status: DC | PRN

## 2013-08-27 MED ORDER — ACETAMINOPHEN 650 MG RE SUPP: 650.0000 mg | Freq: Four times a day (QID) | RECTAL | Status: DC | PRN

## 2013-08-27 MED ORDER — ENOXAPARIN SODIUM 30 MG/0.3ML ~~LOC~~ SOLN
30.0000 mg | SUBCUTANEOUS | Status: DC
  Administered 2013-08-27: 30 mg via SUBCUTANEOUS
  Filled 2013-08-27 (×3): qty 0.3

## 2013-08-27 MED ORDER — MEMANTINE HCL 5 MG PO TABS
5.0000 mg | ORAL_TABLET | Freq: Two times a day (BID) | ORAL | Status: DC
  Administered 2013-08-27 – 2013-08-31 (×9): 5 mg via ORAL
  Filled 2013-08-27 (×11): qty 1

## 2013-08-27 MED ORDER — ASPIRIN 81 MG PO CHEW
81.0000 mg | CHEWABLE_TABLET | Freq: Every day | ORAL | Status: DC
  Administered 2013-08-27 – 2013-08-31 (×5): 81 mg via ORAL
  Filled 2013-08-27 (×5): qty 1

## 2013-08-27 MED ORDER — ACETAMINOPHEN 325 MG PO TABS: 650.0000 mg | ORAL_TABLET | Freq: Four times a day (QID) | ORAL | Status: DC | PRN

## 2013-08-27 MED ORDER — SODIUM CHLORIDE 0.9 % IV BOLUS (SEPSIS)
1000.0000 mL | Freq: Once | INTRAVENOUS | Status: AC
  Administered 2013-08-27: 1000 mL via INTRAVENOUS

## 2013-08-27 MED ORDER — MEGESTROL ACETATE 40 MG PO TABS
40.0000 mg | ORAL_TABLET | Freq: Every day | ORAL | Status: DC
  Administered 2013-08-27 – 2013-08-31 (×5): 40 mg via ORAL
  Filled 2013-08-27 (×6): qty 1

## 2013-08-27 MED ORDER — RISPERIDONE 0.25 MG PO TABS: 0.2500 mg | ORAL_TABLET | Freq: Two times a day (BID) | ORAL | Status: DC

## 2013-08-27 MED ORDER — TRAZODONE HCL 150 MG PO TABS
150.0000 mg | ORAL_TABLET | Freq: Every day | ORAL | Status: DC
  Administered 2013-08-27 – 2013-08-30 (×4): 150 mg via ORAL
  Filled 2013-08-27 (×6): qty 1

## 2013-08-27 MED ORDER — HYDRALAZINE HCL 20 MG/ML IJ SOLN
10.0000 mg | Freq: Four times a day (QID) | INTRAMUSCULAR | Status: DC | PRN
  Administered 2013-08-27: 10 mg via INTRAVENOUS
  Filled 2013-08-27: qty 1

## 2013-08-27 NOTE — ED Notes (Signed)
Marissa Chung at the bedside obtaining urine sample.

## 2013-08-27 NOTE — ED Notes (Signed)
Attempted to call report x 1  

## 2013-08-27 NOTE — ED Provider Notes (Signed)
Medical screening examination/treatment/procedure(s) were performed by non-physician practitioner and as supervising physician I was immediately available for consultation/collaboration.   EKG Interpretation   Date/Time:  Monday August 25 2013 22:39:28 EDT Ventricular Rate:  85 PR Interval:  155 QRS Duration: 86 QT Interval:  341 QTC Calculation: 405 R Axis:   39 Text Interpretation:  Sinus rhythm Baseline wander When compared with ECG  of 07/26/2013 No significant change was found Confirmed by [facility]  MD,  Avangelina Flight ([id number]) on 08/25/2013 10:45:12 PM        Samuel Jester, DO 08/27/13 1606

## 2013-08-27 NOTE — ED Notes (Signed)
Patient returned from CT

## 2013-08-27 NOTE — ED Provider Notes (Signed)
CSN: [id number]     Arrival date & time 08/27/13  4098 History   First MD Initiated Contact with Patient 08/27/13 [phone]     Chief Complaint  Patient presents with  . Altered Mental Status   HPI Ms. Suttles is a 80 yo woman with frontal lobe dementia and hypertension who presented via EMS for unresponsiveness at home this morning. Of note, she visited the [facility] ED on Monday for BPs >200 on two occasions earlier that day. She was started on HCTZ in the ED and sent out with normal BPs. A visiting nurse gives her medications at home. She was seen to be at baseline last night as recently as 8pm. Then, she failed to answer her niece's phone calls this morning. Her niece visited her home around 7:30am and found her standing in her bathroom, unresponsive. She would not answer her questions and kept closing her eyes. Over the next hour (EMS-->our ED), she became more responsive.   EMS found her to have FS of 391 and a BP of 88/50. They gave her fluids.  Past Medical History  Diagnosis Date  . Hypertension   . Frontal lobe dementia    History reviewed. No pertinent past surgical history. History reviewed. No pertinent family history. History  Substance Use Topics  . Smoking status: Never Smoker   . Smokeless tobacco: Not on file  . Alcohol Use: No   OB History   Grav Para Term Preterm Abortions TAB SAB Ect Mult Living                 Review of Systems This information was gathered from the patient's niece, as patient was unable to provide General: no recent illness, recent ED visit for hypertension to 200s Skin: no rashes or lesions HEENT: no headaches Cardiac: no chest pain or palpitations Respiratory: no shortness of breath GI: no abdominal pain or changes in BMs Urinary: no reported dysuria, no hematuria Msk: no tenderness or new joint pain Neurologic: no weakness or numbness/tingling   Allergies  Shellfish allergy  Home Medications   Prior to Admission medications    Medication Sig Start Date End Date Taking? Authorizing Provider  aspirin 81 MG chewable tablet Chew 1 tablet (81 mg total) by mouth daily. 07/28/13   Nishant Dhungel, MD  feeding supplement, ENSURE COMPLETE, (ENSURE COMPLETE) LIQD Take 237 mLs by mouth 2 (two) times daily between meals. 07/28/13   Nishant Dhungel, MD  hydrochlorothiazide (HYDRODIURIL) 12.5 MG tablet Take 1 tablet (12.5 mg total) by mouth daily. 08/25/13   Renne Crigler, PA-C  lisinopril (PRINIVIL,ZESTRIL) 20 MG tablet Take 1 tablet (20 mg total) by mouth daily. 04/25/13   Richardean Canal, MD  megestrol (MEGACE) 40 MG tablet Take 1 tablet (40 mg total) by mouth daily. 07/28/13   Nishant Dhungel, MD  memantine (NAMENDA) 5 MG tablet Take 5 mg by mouth 2 (two) times daily.    Historical Provider, MD  risperiDONE (RISPERDAL) 0.25 MG tablet Take 0.25-0.5 mg by mouth 2 (two) times daily. .25 mg am and .50 mg at bedtime    Historical Provider, MD  simvastatin (ZOCOR) 10 MG tablet Take 10 mg by mouth at bedtime.    Historical Provider, MD  traZODone (DESYREL) 150 MG tablet Take 150 mg by mouth at bedtime.    Historical Provider, MD   BP 157/71  Pulse 83  Temp(Src) 98.4 F (36.9 C) (Oral)  Resp 16  SpO2 100% Physical Exam Appearance: patient is lethargic, eyes closed, difficult  to keep her engaged in conversation HEENT: AT/[place], hearing is somewhat decreased (had to repeat questions several times), PERRL, EOMi, no lymphadenopathy Heart: RRR, normal S1S2, no MRG Lungs: CTAB, no wheezes Abdomen: BS+, nontender Musculoskeletal: no joint swelling or tenderness Extremities: no edema, thin extremities Neurologic: somnolent, A&Ox2 (knew location was [facility], knew name, stated that the year was 1915, stated that birth date was 1933-09-04 which is incorrect by 2 days) Skin: no lesions, no rash, no sign of trauma  ED Course  Procedures (including critical care time) Labs Review Labs Reviewed  CBC WITH DIFFERENTIAL  COMPREHENSIVE METABOLIC  PANEL  URINALYSIS, ROUTINE W REFLEX MICROSCOPIC  I-STAT TROPOININ, ED    Imaging Review Ct Head Wo Contrast  08/27/2013   CLINICAL DATA:  Dizziness and altered mental status  EXAM: CT HEAD WITHOUT CONTRAST  TECHNIQUE: Contiguous axial images were obtained from the base of the skull through the vertex without intravenous contrast.  COMPARISON:  Head CT July 26, 2013 and brain MRI July 27, 2013  FINDINGS: There is age related volume loss. There is borderline cerebellar tonsillar ectopia. There is no appreciable mass, hemorrhage, extra-axial fluid collection, or midline shift. There is patchy small vessel disease in the centra semiovale bilaterally. No new gray-white compartment lesions identified. There is no demonstrable acute infarct.  The bony calvarium appears intact. Visualized mastoid air cells are clear. There is opacification of several ethmoid air cells bilaterally. There is a small benign osteoma in the left anterior ethmoid complex.  IMPRESSION: Age related volume loss. Mild cerebellar tonsillar ectopia, stable. There is patchy periventricular small vessel disease. No intracranial mass, hemorrhage, or acute appearing infarct. Patchy ethmoid sinus disease bilaterally.   Electronically Signed   By: Bretta Bang M.D.   On: 08/27/2013 11:43   Dg Chest Portable 1 View  08/27/2013   CLINICAL DATA:  80 year old female altered mental status. Initial encounter.  EXAM: PORTABLE CHEST - 1 VIEW  COMPARISON:  None.  FINDINGS: Portable AP semi upright view at at 1008 hrs. Cardiac size at the upper limits of normal versus mild cardiomegaly. Other mediastinal contours are within normal limits. Visualized tracheal air column is within normal limits. Allowing for portable technique, the lungs are clear. No pneumothorax.  IMPRESSION: No acute cardiopulmonary abnormality.   Electronically Signed   By: Augusto Gamble M.D.   On: 08/27/2013 10:15     EKG Interpretation   Date/Time:  Wednesday August 27 2013  09:51:37 EDT Ventricular Rate:  77 PR Interval:  152 QRS Duration: 83 QT Interval:  364 QTC Calculation: 412 R Axis:   51 Text Interpretation:  Sinus rhythm Consider left atrial enlargement  Borderline low voltage, extremity leads No significant change since last  tracing Confirmed by YAO  MD, DAVID ([id number]) on 08/27/2013 9:54:56 AM      MDM   Final diagnoses:  None    Ms. Audi is a 80 yo woman with frontal lobe dementia and hypertension who lives alone who experienced an episode of non-responsiveness and confusion this morning, noticed during a visit by her niece. Of note, she was placed on a new medication (HCTZ) two days ago after a visit to the [facility] ED. She is lethargic on our exam, came in hypotensive, is now hypertensive. She had a negative head CT, chest XR and UA. Because further workup is needed to determine the source of her delirium, she will be admitted to tele.   Dionne Ano, MD 08/27/13 [phone]

## 2013-08-27 NOTE — H&P (Addendum)
[facility] History and Physical  Marissa Chung:[id number] DOB: 03-Jul-1933 DOA: 08/27/2013  Referring physician:EDP PCP: Leanor Rubenstein, MD   Chief Complaint: not acting right  HPI: Marissa Chung is a 80 y.o. female with PMH of Dementia, uncontrolled HTN, resident of Independent living facility is brought to the ER by her Niece today. She has been seen in the ER multiple times with episodic confusion in the last 3-77months. Was last in ER 8/24 due to uncontrolled HTN in the 200s and was started on HCTZ and discharged back, she took this Monday and Tuesday night. This morning when her niece telephoned her she didn't pick up the phone, went she went there she found her in the bathroom confused, standing up and wouldn't respond to any questions or instructions, this continued for a while and hence she called EMS. When EMS arrived they found her BP to be 88/60 and brought her to the ER.  In ER, noted to be confused, sleepy initially but awake now and rest of workup unremarkable and [facility] consulted     Review of Systems: unable to obtain due to dementia, confusion Constitutional:  No weight loss, night sweats, Fevers, chills, fatigue.  HEENT:  No headaches, Difficulty swallowing,Tooth/dental problems,Sore throat,  No sneezing, itching, ear ache, nasal congestion, post nasal drip,  Cardio-vascular:  No chest pain, Orthopnea, PND, swelling in lower extremities, anasarca, dizziness, palpitations  GI:  No heartburn, indigestion, abdominal pain, nausea, vomiting, diarrhea, change in bowel habits, loss of appetite  Resp:  No shortness of breath with exertion or at rest. No excess mucus, no productive cough, No non-productive cough, No coughing up of blood.No change in color of mucus.No wheezing.No chest wall deformity  Skin:  no rash or lesions.  GU:  no dysuria, change in color of urine, no urgency or frequency. No flank pain.  Musculoskeletal:  No joint pain or swelling. No  decreased range of motion. No back pain.  Psych:  No change in mood or affect. No depression or anxiety. No memory loss.   Past Medical History  Diagnosis Date  . Hypertension   . Frontal lobe dementia    History reviewed. No pertinent past surgical history. Social History:  reports that she has never smoked. She does not have any smokeless tobacco history on file. She reports that she does not drink alcohol or use illicit drugs.  Allergies  Allergen Reactions  . Shellfish Allergy Hives    History reviewed. No pertinent family history.   Prior to Admission medications   Medication Sig Start Date End Date Taking? Authorizing Provider  aspirin 81 MG chewable tablet Chew 1 tablet (81 mg total) by mouth daily. 07/28/13  Yes Nishant Dhungel, MD  feeding supplement, ENSURE COMPLETE, (ENSURE COMPLETE) LIQD Take 237 mLs by mouth 2 (two) times daily between meals. 07/28/13  Yes Nishant Dhungel, MD  hydrochlorothiazide (HYDRODIURIL) 12.5 MG tablet Take 1 tablet (12.5 mg total) by mouth daily. 08/25/13  Yes Renne Crigler, PA-C  lisinopril (PRINIVIL,ZESTRIL) 20 MG tablet Take 1 tablet (20 mg total) by mouth daily. 04/25/13  Yes Richardean Canal, MD  megestrol (MEGACE) 40 MG tablet Take 1 tablet (40 mg total) by mouth daily. 07/28/13  Yes Nishant Dhungel, MD  memantine (NAMENDA) 5 MG tablet Take 5 mg by mouth 2 (two) times daily.   Yes Historical Provider, MD  risperiDONE (RISPERDAL) 0.25 MG tablet Take 0.25-0.5 mg by mouth 2 (two) times daily. .25 mg am and .50 mg at bedtime   Yes Historical  Provider, MD  simvastatin (ZOCOR) 10 MG tablet Take 10 mg by mouth at bedtime.   Yes Historical Provider, MD  traZODone (DESYREL) 150 MG tablet Take 150 mg by mouth at bedtime.   Yes Historical Provider, MD   Physical Exam: Filed Vitals:   08/27/13 1030 08/27/13 1100 08/27/13 1115 08/27/13 1215  BP: 112/91 155/59 169/74 145/73  Pulse: 66 58 65 75  Temp:      TempSrc:      Resp: SpO2: 98% 98% 100% 100%     Wt Readings from Last 3 Encounters:  07/27/13 66.86 kg (147 lb 6.4 oz)    General:  Appears calm and comfortable, oriented to self only, confused Eyes: PERRL, normal lids, irises & conjunctiva ENT: grossly normal hearing, lips & tongue Neck: no LAD, masses or thyromegaly Cardiovascular: RRR, no m/r/g. No LE edema. Respiratory: CTA bilaterally, no w/r/r. Normal respiratory effort. Abdomen: soft, nt, ND, BS present Skin: no rash or induration seen on limited exam Musculoskeletal: grossly normal tone BUE/BLE Psychiatric:confused Neurologic: motor 5/5, sensory light touch intact, DTR 1plus, plantars mute          Labs on Admission:  Basic Metabolic Panel:  Recent Labs Lab 08/25/13 2205 08/27/13 0954  NA 141 142  K 3.8 3.7  CL 105 106  CO2 22 23  GLUCOSE 143* 156*  BUN 15 14  CREATININE 0.85 0.97  CALCIUM 9.4 8.8   Liver Function Tests:  Recent Labs Lab 08/27/13 0954  AST 15  ALT 10  ALKPHOS 57  BILITOT 0.4  PROT 6.4  ALBUMIN 3.2*   No results found for this basename: LIPASE, AMYLASE,  in the last 168 hours No results found for this basename: AMMONIA,  in the last 168 hours CBC:  Recent Labs Lab 08/25/13 2205 08/27/13 0954  WBC 5.8 7.3  NEUTROABS 3.8 6.0  HGB 11.1* 10.6*  HCT 33.8* 32.0*  MCV 86.0 85.1  PLT 153 149*   Cardiac Enzymes: No results found for this basename: CKTOTAL, CKMB, CKMBINDEX, TROPONINI,  in the last 168 hours  BNP (last 3 results) No results found for this basename: PROBNP,  in the last 8760 hours CBG: No results found for this basename: GLUCAP,  in the last 168 hours  Radiological Exams on Admission: Ct Head Wo Contrast  08/27/2013   CLINICAL DATA:  Dizziness and altered mental status  EXAM: CT HEAD WITHOUT CONTRAST  TECHNIQUE: Contiguous axial images were obtained from the base of the skull through the vertex without intravenous contrast.  COMPARISON:  Head CT July 26, 2013 and brain MRI July 27, 2013  FINDINGS: There is  age related volume loss. There is borderline cerebellar tonsillar ectopia. There is no appreciable mass, hemorrhage, extra-axial fluid collection, or midline shift. There is patchy small vessel disease in the centra semiovale bilaterally. No new gray-white compartment lesions identified. There is no demonstrable acute infarct.  The bony calvarium appears intact. Visualized mastoid air cells are clear. There is opacification of several ethmoid air cells bilaterally. There is a small benign osteoma in the left anterior ethmoid complex.  IMPRESSION: Age related volume loss. Mild cerebellar tonsillar ectopia, stable. There is patchy periventricular small vessel disease. No intracranial mass, hemorrhage, or acute appearing infarct. Patchy ethmoid sinus disease bilaterally.   Electronically Signed   By: Bretta Bang M.D.   On: 08/27/2013 11:43   Dg Chest Portable 1 View  08/27/2013   CLINICAL DATA:  80 year old female altered mental status. Initial  encounter.  EXAM: PORTABLE CHEST - 1 VIEW  COMPARISON:  None.  FINDINGS: Portable AP semi upright view at at 1008 hrs. Cardiac size at the upper limits of normal versus mild cardiomegaly. Other mediastinal contours are within normal limits. Visualized tracheal air column is within normal limits. Allowing for portable technique, the lungs are clear. No pneumothorax.  IMPRESSION: No acute cardiopulmonary abnormality.   Electronically Signed   By: Augusto Gamble M.D.   On: 08/27/2013 10:15    EKG: Independently reviewed. NSR, no acute ST T wave changes  Assessment/Plan  1. Episodic lethargy/confusion -likely related to transient hypotension -now resolved, exam non focal, CT head benign -check Orthostatics, s/p 1L fluid bolus -CT head, CXR, labs, EKG unremarkable. -Similar presentation last month, MRI/MRA donw then and negative, i do not see need for repeat MRI at this time  2. H/o HTN -with brief episode of hypotension noted per EMS, ? Volume depletion from  diuretic -BP stable now, resume Lisinopril, hold HCTZ, orthostatics -start Amlodipine if BP continues to trend up  3. Dementia  -with episodes of memory decline, confusion and sundowning noted per niece -continue namenda and Risperidone per home regimen  4. Hyperglycemia -check hbaic  Code Status: Full COde, asked Niece to reconsider this DVT Prophylaxis: lovenox Family Communication:d/w niece and next of kin at bedside Disposition Plan: needs ALF, dementia unit, 3rd ER visit with confusion in  Time spent:  [facility] Pager [phone]  **Disclaimer: This note may have been dictated with voice recognition software. Similar sounding words can inadvertently be transcribed and this note may contain transcription errors which may not have been corrected upon publication of note.**

## 2013-08-27 NOTE — ED Notes (Signed)
Admitting Physician at the bedside.  

## 2013-08-27 NOTE — ED Notes (Signed)
Patient being transported by [place], NT.

## 2013-08-27 NOTE — ED Provider Notes (Signed)
I saw and evaluated the patient, reviewed the resident's note and I agree with the findings and plan.   EKG Interpretation   Date/Time:  Wednesday August 27 2013 09:51:37 EDT Ventricular Rate:  77 PR Interval:  152 QRS Duration: 83 QT Interval:  364 QTC Calculation: 412 R Axis:   51 Text Interpretation:  Sinus rhythm Consider left atrial enlargement  Borderline low voltage, extremity leads No significant change since last  tracing Confirmed by YAO  MD, DAVID ([id number]) on 08/27/2013 9:54:56 AM      Marissa Chung is a 80 y.o. female hx of dementia, HTN, here with AMS. Last normal was last night. Today, was found in the bathroom and not answering questions. On exam, sleepy, arousable to sternal rub. She is protecting her airway. A &O x 2. nonfocal neuro exam. Heart, lung, abdomen unremarkable. CT head, labs, CXR, UA unremarkable. As per niece, she is still confused. Will admit for monitoring.    Richardean Canal, MD 08/27/13 [phone]

## 2013-08-27 NOTE — ED Notes (Signed)
Pt arrived by [facility] from [facility]. Caregiver last saw pt at baseline yesterday evening 8:15. Found this am 0750 in restroom not acting her normal self. Will follow commands but minimal response from pt. bp is 88/60 cbg 375. Pt was seen at [facility] 2 days ago for htn, started on HCTZ yesterday evening.

## 2013-08-28 DIAGNOSIS — F29 Unspecified psychosis not due to a substance or known physiological condition: Secondary | ICD-10-CM

## 2013-08-28 LAB — CBC
HEMATOCRIT: 36 % (ref 36.0–46.0)
Hemoglobin: 11.8 g/dL — ABNORMAL LOW (ref 12.0–15.0)
MCH: 28.5 pg (ref 26.0–34.0)
MCHC: 32.8 g/dL (ref 30.0–36.0)
MCV: 87 fL (ref 78.0–100.0)
Platelets: 178 10*3/uL (ref 150–400)
RBC: 4.14 MIL/uL (ref 3.87–5.11)
RDW: 14.4 % (ref 11.5–15.5)
WBC: 4 10*3/uL (ref 4.0–10.5)

## 2013-08-28 LAB — COMPREHENSIVE METABOLIC PANEL
ALK PHOS: 62 U/L (ref 39–117)
ALT: 12 U/L (ref 0–35)
AST: 18 U/L (ref 0–37)
Albumin: 3.7 g/dL (ref 3.5–5.2)
Anion gap: 17 — ABNORMAL HIGH (ref 5–15)
BUN: 11 mg/dL (ref 6–23)
CO2: 20 mEq/L (ref 19–32)
Calcium: 9.2 mg/dL (ref 8.4–10.5)
Chloride: 105 mEq/L (ref 96–112)
Creatinine, Ser: 0.9 mg/dL (ref 0.50–1.10)
GFR calc non Af Amer: 60 mL/min — ABNORMAL LOW (ref >90)
GFR, EST AFRICAN AMERICAN: 69 mL/min — AB (ref >90)
Glucose, Bld: 103 mg/dL — ABNORMAL HIGH (ref 70–99)
Potassium: 3.5 mEq/L — ABNORMAL LOW (ref 3.7–5.3)
Sodium: 142 mEq/L (ref 137–147)
TOTAL PROTEIN: 7.1 g/dL (ref 6.0–8.3)
Total Bilirubin: 0.5 mg/dL (ref 0.3–1.2)

## 2013-08-28 LAB — GLUCOSE, CAPILLARY
GLUCOSE-CAPILLARY: 80 mg/dL (ref 70–99)
GLUCOSE-CAPILLARY: 114 mg/dL — AB (ref 70–99)
GLUCOSE-CAPILLARY: 105 mg/dL — AB (ref 70–99)
GLUCOSE-CAPILLARY: 107 mg/dL — AB (ref 70–99)
Glucose-Capillary: 109 mg/dL — ABNORMAL HIGH (ref 70–99)

## 2013-08-28 MED ORDER — AMLODIPINE BESYLATE 5 MG PO TABS
5.0000 mg | ORAL_TABLET | Freq: Every day | ORAL | Status: DC
  Administered 2013-08-28 – 2013-08-31 (×4): 5 mg via ORAL
  Filled 2013-08-28 (×4): qty 1

## 2013-08-28 MED ORDER — HALOPERIDOL LACTATE 5 MG/ML IJ SOLN
2.0000 mg | Freq: Four times a day (QID) | INTRAMUSCULAR | Status: DC | PRN
  Administered 2013-08-28: 2 mg via INTRAVENOUS
  Filled 2013-08-28 (×2): qty 1

## 2013-08-28 MED ORDER — LISINOPRIL 40 MG PO TABS
40.0000 mg | ORAL_TABLET | Freq: Every day | ORAL | Status: DC
Start: 2013-08-29 — End: 2013-08-31
  Administered 2013-08-29 – 2013-08-31 (×3): 40 mg via ORAL
  Filled 2013-08-28 (×3): qty 1

## 2013-08-28 MED ORDER — ENOXAPARIN SODIUM 40 MG/0.4ML ~~LOC~~ SOLN
40.0000 mg | SUBCUTANEOUS | Status: DC
  Administered 2013-08-28 – 2013-08-30 (×3): 40 mg via SUBCUTANEOUS
  Filled 2013-08-28 (×4): qty 0.4

## 2013-08-28 MED ORDER — BOOST PLUS PO LIQD
237.0000 mL | Freq: Two times a day (BID) | ORAL | Status: DC
  Administered 2013-08-29 – 2013-08-31 (×4): 237 mL via ORAL
  Filled 2013-08-28 (×10): qty 237

## 2013-08-28 NOTE — Progress Notes (Signed)
INITIAL NUTRITION ASSESSMENT  DOCUMENTATION CODES Per approved criteria  -Not Applicable   INTERVENTION: Provide Boost Plus po BID, each supplement contains 360 kcal and 14 grams of protein.  NUTRITION DIAGNOSIS: Inadequate oral intake related to decreased appetite as evidenced by meal completion of 30-70%.   Goal: Pt to meet >/= 90% of their estimated nutrition needs   Monitor:  PO intake, supplement acceptance, weight trends, labs, I/O's  Reason for Assessment: MST  80 y.o. female  Admitting Dx: Delirium  ASSESSMENT: with PMH of Dementia, uncontrolled HTN, resident of Independent living facility is brought to the ER by her Niece today. She has been seen in the ER multiple times with episodic confusion in the last 3-90months. Pt found her in the bathroom confused, standing up and wouldn't respond to any questions or instructions.  Pt reports having an "ok" appetite currently. Pt reports her appetite has been declining over the past year. Pt reports she mostly has been eating good at home but has been noticing that sometimes she has been eating less, however still tries to eat 3 full meals a day, along with drinking Ensure at least one to two times a day. Pt reports she has lost weight but is unable to determine how much. Per Epic records, pt with a 3% weight loss in one month. Pt reports she would like to try an oral supplement other than Ensure. Will order Boost plus. Pt denies any stomach pains or difficulties eating/swallowing. Pt was encouraged to eat her food at meals and to drink her oral supplements.  Nutrition Focused Physical Exam:  Subcutaneous Fat:  Orbital Region: N/A Upper Arm Region: Moderate depletion Thoracic and Lumbar Region: WNL  Muscle:  Temple Region: Mild depletion Clavicle Bone Region: WNL Clavicle and Acromion Bone Region: WNL Scapular Bone Region: N/A Dorsal Hand: Moderate depletion Patellar Region: WNL Anterior Thigh Region: Moderate  depletion Posterior Calf Region: WNL  Edema: none  Labs: Low potassium and GFR. High glucose (107-156 mg/dL).  Height: Ht Readings from Last 1 Encounters:  07/27/13  (1.676 m)    Weight: Wt Readings from Last 1 Encounters:  08/27/13 143 lb 6.4 oz (65.046 kg)    Ideal Body Weight: 130 lbs  % Ideal Body Weight: 110%  Wt Readings from Last 10 Encounters:  08/27/13 143 lb 6.4 oz (65.046 kg)  07/27/13 147 lb 6.4 oz (66.86 kg)    Usual Body Weight: 147 lbs  % Usual Body Weight: 97%  BMI:  Body mass index is 23.16 kg/(m^2).  Estimated Nutritional Needs: Kcal: 1650-1850  Protein: 70-80 gram  Fluid: >/=1700 ml/daily  Skin: no issues noted  Diet Order: Sodium Restricted  EDUCATION NEEDS: -No education needs identified at this time   Intake/Output Summary (Last 24 hours) at 08/28/13 0946 Last data filed at 08/27/13 1004  Gross per 24 hour  Intake    250 ml  Output      0 ml  Net    250 ml    Last BM: 8/26   Labs:   Recent Labs Lab 08/25/13 2205 08/27/13 0954  NA 141 142  K 3.8 3.7  CL 105 106  CO2 22 23  BUN 15 14  CREATININE 0.85 0.97  CALCIUM 9.4 8.8  GLUCOSE 143* 156*    CBG (last 3)   Recent Labs  08/27/13 1639 08/28/13 0746  GLUCAP 107* 114*    Scheduled Meds: . aspirin  81 mg Oral Daily  . enoxaparin (LOVENOX) injection  40 mg Subcutaneous  Q24H  . lisinopril  20 mg Oral Daily  . megestrol  40 mg Oral Daily  . memantine  5 mg Oral BID  . risperiDONE  0.25 mg Oral Daily  . risperiDONE  0.5 mg Oral QHS  . simvastatin  10 mg Oral QHS  . traZODone  150 mg Oral QHS    Continuous Infusions:   Past Medical History  Diagnosis Date  . Hypertension   . Frontal lobe dementia   . History of shingles   . Cataract     BILATERAL    Past Surgical History  Procedure Laterality Date  . Finger surgery Right     pinky finger    Marijean Niemann, MS, Provisional LDN Pager # [phone] After hours/ weekend pager # [phone]

## 2013-08-28 NOTE — Progress Notes (Signed)
[facility] PROGRESS NOTE  Marissa Chung ZOX:[id number] DOB: 06-19-1933 DOA: 08/27/2013  PCP: Leanor Rubenstein, MD  Brief HPI: 80yo with PMH as below brought in due to worsening confusion. She was recently started on HCTZ for elevated BP and was noted to have low BP by EMS.  Past medical history:  Past Medical History  Diagnosis Date  . Hypertension   . Frontal lobe dementia   . History of shingles   . Cataract     BILATERAL    Consultants: None  Procedures: None  Antibiotics: None  Subjective: Patient confused. Denies any pain. Unable to obtain any history from her.  Objective: Vital Signs  Filed Vitals:   08/27/13 1518 08/27/13 2108 08/28/13 0621 08/28/13 1017  BP: 163/71 183/81 143/86 154/81  Pulse: 88 77 91 78  Temp: 98.5 F (36.9 C) 97.9 F (36.6 C) 98.8 F (37.1 C) 98.4 F (36.9 C)  TempSrc: Oral Oral Oral Oral  Resp: Weight:  65.046 kg (143 lb 6.4 oz)    SpO2: 93% 97% 98% 99%    Intake/Output Summary (Last 24 hours) at 08/28/13 1519 Last data filed at 08/28/13 1018  Gross per 24 hour  Intake    240 ml  Output      0 ml  Net    240 ml   Filed Weights   08/27/13 2108  Weight: 65.046 kg (143 lb 6.4 oz)    General appearance: alert, cooperative, appears stated age, distracted and no distress Resp: clear to auscultation bilaterally Cardio: regular rate and rhythm, S1, S2 normal, no murmur, click, rub or gallop GI: soft, non-tender; bowel sounds normal; no masses,  no organomegaly Extremities: extremities normal, atraumatic, no cyanosis or edema Neurologic: Alert. Confused. No focal deficits.  Lab Results:  Basic Metabolic Panel:  Recent Labs Lab 08/25/13 2205 08/27/13 0954 08/28/13 0837  NA 141 142 142  K 3.8 3.7 3.5*  CL 105 106 105  CO2 GLUCOSE 143* 156* 103*  BUN CREATININE 0.85 0.97 0.90  CALCIUM 9.4 8.8 9.2   Liver Function Tests:  Recent Labs Lab 08/27/13 0954 08/28/13 0837  AST 15  18  ALT 10 12  ALKPHOS 57 62  BILITOT 0.4 0.5  PROT 6.4 7.1  ALBUMIN 3.2* 3.7   CBC:  Recent Labs Lab 08/25/13 2205 08/27/13 0954 08/28/13 0837  WBC 5.8 7.3 4.0  NEUTROABS 3.8 6.0  --   HGB 11.1* 10.6* 11.8*  HCT 33.8* 32.0* 36.0  MCV 86.0 85.1 87.0  PLT 153 149* 178   Cardiac Enzymes:  Recent Labs Lab 08/27/13 1502 08/27/13 2137  TROPONINI <0.30 <0.30   CBG:  Recent Labs Lab 08/27/13 1407 08/27/13 1639 08/28/13 0746 08/28/13 1131  GLUCAP 80 107* 114* 109*     Studies/Results: Ct Head Wo Contrast  08/27/2013   CLINICAL DATA:  Dizziness and altered mental status  EXAM: CT HEAD WITHOUT CONTRAST  TECHNIQUE: Contiguous axial images were obtained from the base of the skull through the vertex without intravenous contrast.  COMPARISON:  Head CT July 26, 2013 and brain MRI July 27, 2013  FINDINGS: There is age related volume loss. There is borderline cerebellar tonsillar ectopia. There is no appreciable mass, hemorrhage, extra-axial fluid collection, or midline shift. There is patchy small vessel disease in the centra semiovale bilaterally. No new gray-white compartment lesions identified. There is no demonstrable acute infarct.  The bony calvarium appears intact. Visualized mastoid air  cells are clear. There is opacification of several ethmoid air cells bilaterally. There is a small benign osteoma in the left anterior ethmoid complex.  IMPRESSION: Age related volume loss. Mild cerebellar tonsillar ectopia, stable. There is patchy periventricular small vessel disease. No intracranial mass, hemorrhage, or acute appearing infarct. Patchy ethmoid sinus disease bilaterally.   Electronically Signed   By: Bretta Bang M.D.   On: 08/27/2013 11:43   Dg Chest Portable 1 View  08/27/2013   CLINICAL DATA:  80 year old female altered mental status. Initial encounter.  EXAM: PORTABLE CHEST - 1 VIEW  COMPARISON:  None.  FINDINGS: Portable AP semi upright view at at 1008 hrs. Cardiac size  at the upper limits of normal versus mild cardiomegaly. Other mediastinal contours are within normal limits. Visualized tracheal air column is within normal limits. Allowing for portable technique, the lungs are clear. No pneumothorax.  IMPRESSION: No acute cardiopulmonary abnormality.   Electronically Signed   By: Augusto Gamble M.D.   On: 08/27/2013 10:15    Medications:  Scheduled: . amLODipine  5 mg Oral Daily  . aspirin  81 mg Oral Daily  . enoxaparin (LOVENOX) injection  40 mg Subcutaneous Q24H  . [START ON 08/29/2013] lisinopril  40 mg Oral Daily  . megestrol  40 mg Oral Daily  . memantine  5 mg Oral BID  . risperiDONE  0.25 mg Oral Daily  . risperiDONE  0.5 mg Oral QHS  . simvastatin  10 mg Oral QHS  . traZODone  150 mg Oral QHS   Continuous:  ZOX:[id number], acetaminophen, hydrALAZINE, ondansetron (ZOFRAN) IV, ondansetron  Assessment/Plan:  Active Problems:   Dementia   Delirium   Hypotension   HTN (hypertension)   Confusion    Episodic lethargy/confusion  Could be related to transient hypotension. Now seems back to baseline. CT head, CXR, labs, EKG unremarkable. Similar presentation last month, MRI/MRA was done then and negative. Do not see need to repeat MRI at this time.  H/o HTN poorly controlled Brief episode of hypotension noted per EMS. Recently started on HCTZ which could be the reason though hard to say. BP now very high. Will increase dose of Lisinopril and ad Amlodipine. Monitor for another day. She has had multiple ED visits recently.  Dementia with behavioral problems Per her niece, Burna Mortimer, patient has been declining slowly the last few months. She will get additional resources for patient at her ILF. She does not anticipate her requiring higher level of care. Will get PT/OT involved. Continue namenda and Risperidone per home regimen.  Mild Hyperglycemia  HBA1c 5.9. No need for further evaluation.  Code Status: Full Code DVT Prophylaxis: lovenox  Family  Communication: d/w niece Burna Mortimer over phone.  Disposition Plan: Await PT/OT. Per niece, she plans to take her back to the ILF with additional help.     LOS: 1 day   [facility]  [facility] Pager [phone] 08/28/2013, 3:19 PM  If 8PM-8AM, please contact night-coverage at [email or web address], password [email or web address]   Disclaimer: This note was dictated with voice recognition software. Similar sounding words can inadvertently be transcribed and may not be corrected upon review.

## 2013-08-28 NOTE — Plan of Care (Signed)
Problem: Phase I Progression Outcomes Goal: Hemodynamically stable Outcome: Not Met (add Reason) Filed Vitals:    08/27/13 2108  BP: 183/81  Pulse: 77  Temp: 97.9 F (36.6 C)  Resp: 18

## 2013-08-29 LAB — GLUCOSE, CAPILLARY
Glucose-Capillary: 90 mg/dL (ref 70–99)
Glucose-Capillary: 123 mg/dL — ABNORMAL HIGH (ref 70–99)
Glucose-Capillary: 138 mg/dL — ABNORMAL HIGH (ref 70–99)

## 2013-08-29 MED ORDER — AMLODIPINE BESYLATE 5 MG PO TABS: 5.0000 mg | ORAL_TABLET | Freq: Every day | ORAL | Status: DC

## 2013-08-29 MED ORDER — RISPERIDONE 0.25 MG PO TABS: 0.2500 mg | ORAL_TABLET | Freq: Two times a day (BID) | ORAL | Status: DC

## 2013-08-29 MED ORDER — LISINOPRIL 20 MG PO TABS: 40.0000 mg | ORAL_TABLET | Freq: Every day | ORAL | Status: DC

## 2013-08-29 NOTE — Progress Notes (Signed)
Patient disoriented x 3 to place, situation, and time and wandering the hallway and entering other patient's rooms. She is unable to be redirected by staff. Safety sitter is present at bedside. Craige Cotta NP made aware. New orders placed. Will continue to monitor. Gilman Schmidt

## 2013-08-29 NOTE — Progress Notes (Signed)
Occupational Therapy Evaluation Patient Details Name: Marissa Chung MRN: [id number] DOB: 10/26/1933 Today's Date: 08/29/2013    History of Present Illness Patient is a 80 y/o female with PMH of dementia, uncontrolled HTN, resident of Independent living facility is brought to the ER by her Niece today due to "not acting herself." She has been seen in the ER multiple times with episodic confusion in the last 3-75months.Was last in ER 8/24 due to uncontrolled HTN in the 200s and was started on HCTZ and discharged home. On 8/26 when her niece telephoned her she didn't pick up the phone, when she went there she found her in the bathroom confused, standing up and wouldn't respond to any questions or instructions. When EMS arrived BP was 88/60 and she was brought her to the ER.    Clinical Impression   Pt admitted with the above.  She has history of dementia and demonstrates impaired problem solving, sequencing, memory, and poor safety awareness during OT eval.  Her balance is impaired - she requires min A for LB ADLs and standing activities due to LOB.  She is at high risk for falls.  Per chart review, she was admitted from [place] - unsure if she was residing in Independent Living or ALF.  Upon discharge, she will require assist anytime she is ambulatory or standing as she is high risk for falls.  If [place] is able to provide this level of care, recommend return there with HHOT/PT, as this is familiar environment for this pt; however, if they are unable to provide this level of care, recommend SNF    Follow Up Recommendations  SNF;Supervision/Assistance - 24 hour    Equipment Recommendations  None recommended by OT    Recommendations for Other Services       Precautions / Restrictions Precautions Precautions: Fall Restrictions Weight Bearing Restrictions: No      Mobility Bed Mobility Overal bed mobility: Modified Independent             General bed mobility  comments: HOB elevated.  Transfers Overall transfer level: Needs assistance Equipment used: None Transfers: Sit to/from [employer] Sit to Stand: Min guard Stand pivot transfers: Min guard       General transfer comment: Pt unsteady and impulsive.  Requires min guard assist    Balance Overall balance assessment: Needs assistance Sitting-balance support: Feet supported Sitting balance-Leahy Scale: Good     Standing balance support: During functional activity Standing balance-Leahy Scale: Poor Standing balance comment: Pt requires min A to maintain balance during grooming tasks                             ADL Overall ADL's : Needs assistance/impaired Eating/Feeding: Independent   Grooming: Wash/dry hands;Wash/dry face;Oral care;Minimal assistance;Standing Grooming Details (indicate cue type and reason): Pt demonstrated difficulty difficulty problem solving how to adjust water temp; she attempted to replace toothpaste cap on backwards, and unable to figure out error - required assistance.  Unable to locate mouthwash that was right in front of her.  SHe required min A due to impaired balance and fall risk  Upper Body Bathing: Supervision/ safety;Sitting   Lower Body Bathing: Minimal assistance;Sit to/from stand   Upper Body Dressing : Supervision/safety;Sitting   Lower Body Dressing: Sit to/from stand;Minimal assistance   Toilet Transfer: Minimal assistance;Ambulation;Comfort height toilet   Toileting- Clothing Manipulation and Hygiene: Minimal assistance;Sit to/from stand       Functional mobility  during ADLs: Minimal assistance General ADL Comments: pt with balance Loss while standing at sink to brush teeth requiring min a to recover.       Vision                     Perception     Praxis      Pertinent Vitals/Pain Pain Assessment: No/denies pain     Hand Dominance     Extremity/Trunk Assessment Upper Extremity  Assessment Upper Extremity Assessment: Overall WFL for tasks assessed   Lower Extremity Assessment Lower Extremity Assessment: Defer to PT evaluation       Communication Communication Communication: No difficulties   Cognition Arousal/Alertness: Awake/alert Behavior During Therapy: WFL for tasks assessed/performed Overall Cognitive Status: No family/caregiver present to determine baseline cognitive functioning Area of Impairment: Orientation;Memory;Following commands;Safety/judgement Orientation Level: Disoriented to;Time;Situation;Place (Able to state place with increased cues and time, "hospital" but not the name. Year, "1059" )   Memory: Decreased short-term memory Following Commands: Follows multi-step commands inconsistently Safety/Judgement: Decreased awareness of safety;Decreased awareness of deficits     General Comments: Pt with h/o dementia.  Unsure if she is at baseline.  This would also be difficult to determine in this environment due to the novelty and change from her norm.    General Comments       Exercises       Shoulder Instructions      Home Living Family/patient expects to be discharged to:: Skilled nursing facility                                 Additional Comments: Per chart review, pt resided at [facility]F.  And, plan may be for her to return there with increased assistance       Prior Functioning/Environment          Comments: No family present.  Per sitter in room, niece had informed her pt was able to perform BADLs without assistance, but had distant supervision at facility     OT Diagnosis: Generalized weakness;Cognitive deficits   OT Problem List: Impaired balance (sitting and/or standing);Decreased cognition;Decreased safety awareness   OT Treatment/Interventions:      OT Goals(Current goals can be found in the care plan section) Acute Rehab OT Goals Patient Stated Goal: none stated. OT Goal Formulation:  Patient unable to participate in goal setting  OT Frequency:     Barriers to D/C:            Co-evaluation              End of Session Nurse Communication: Mobility status  Activity Tolerance: Patient tolerated treatment well Patient left: in chair;with call bell/phone within reach;with nursing/sitter in room   Time: 1157-1209 OT Time Calculation (min): 12 min Charges:  OT General Charges $OT Visit: 1 Procedure OT Evaluation $Initial OT Evaluation Tier I: 1 Procedure G-Codes:    Jeani Hawking M 09-06-2013, 12:29 PM

## 2013-08-29 NOTE — Progress Notes (Signed)
Note/chart reviewed.  Katie Wynn Alldredge, RD, LDN Pager #: [phone] After-Hours Pager #: [phone]  

## 2013-08-29 NOTE — Plan of Care (Signed)
Problem: Phase II Progression Outcomes Goal: Discharge plan established Outcome: Progressing Patient's niece, Burna Mortimer, states she will gather additional resources at pt's independent living facility and require assistance.

## 2013-08-29 NOTE — Care Management Note (Signed)
CARE MANAGEMENT NOTE 08/29/2013  Patient:  Marissa Chung, Marissa Chung   Account Number:  [id number]  Date Initiated:  08/29/2013  Documentation initiated by:  Luisfernando Brightwell  Subjective/Objective Assessment:   CM following for progression and d/c planning.     Action/Plan:   08/29/2013 Noted recommendation by PT for short term SNF for rehab. This CM spoke with pt niece Moises Blood @ [phone] and message left for Kimberlee Nearing at [phone]   Anticipated DC Date:     Anticipated DC Plan:  [facility]         Choice offered to / List presented to:             Status of service:  In process, will continue to follow Medicare Important Message given?  YES (If response is "NO", the following Medicare IM given date fields will be blank) Date Medicare IM given:  08/29/2013 Medicare IM given by:  Dorla Guizar Date Additional Medicare IM given:   Additional Medicare IM given by:    Discharge Disposition:    Per UR Regulation:    If discussed at Long Length of Stay Meetings, dates discussed:    Comments:  08/29/2013 Message left for pt niece Ms Mock at number listed above and spoke with pt neice Ms Joseph Art at number listed above. Per Ms Joseph Art, she agrees that short term rehab at a skilled facility would be the best option. This CM will notify the CSW for bed search. IM letter left in pt room and explained to Ms Joseph Art. Will continue to follow and assist as needed. Johny Shock RN MPH, case manager, [phone]

## 2013-08-29 NOTE — Evaluation (Signed)
Physical Therapy Evaluation Patient Details Name: Marissa Chung MRN: [id number] DOB: 1933-09-25 Today's Date: 08/29/2013   History of Present Illness  Patient is a 80 y/o female with PMH of dementia, uncontrolled HTN, resident of Independent living facility is brought to the ER by her Niece today due to "not acting herself." She has been seen in the ER multiple times with episodic confusion in the last 3-74months.Was last in ER 8/24 due to uncontrolled HTN in the 200s and was started on HCTZ and discharged home. On 8/26 when her niece telephoned her she didn't pick up the phone, when she went there she found her in the bathroom confused, standing up and wouldn't respond to any questions or instructions. When EMS arrived BP was 88/60 and she was brought her to the ER.    Clinical Impression  Patient presents with functional limitations due to deficits listed in PT problem list (see below). Pt with hx of dementia with worsening confusion affecting safety awareness/judgment and limiting safe mobility. Pt not safe to be home alone at this time due to being high falls risk and not able to care for self. Multiple LOB noted during gait training. Pt would benefit from ST SNF with possible transfer to dementia unit so pt can have the appropriate 24/7 supervision, improve safe mobility and maximize independence at discharge.    Follow Up Recommendations SNF;Supervision/Assistance - 24 hour (MIght benefit from dementia unit.)    Equipment Recommendations  None recommended by PT    Recommendations for Other Services       Precautions / Restrictions Precautions Precautions: Fall Restrictions Weight Bearing Restrictions: No      Mobility  Bed Mobility Overal bed mobility: Modified Independent             General bed mobility comments: HOB elevated.  Transfers Overall transfer level: Needs assistance Equipment used: None Transfers: Sit to/from [employer] Sit to Stand: Min  guard Stand pivot transfers: Min guard       General transfer comment: Min guard for safety as pt mildly impulsive upon standing. Ambulated to bathroom and stood from toilet after being instructed to ask therapist for help upon standing.  Ambulation/Gait Ambulation/Gait assistance: Min assist Ambulation Distance (Feet): 350 Feet Assistive device: None Gait Pattern/deviations: Step-through pattern;Narrow base of support;Drifts right/left;Scissoring;Decreased stride length Gait velocity: Differential speeds noted throughout gait.   General Gait Details: Required constant Min A for balance and stability as pt consistently staggering to right/left demonstrating scrissoring gait pattern at times. Multiple LOB requiring physical assist to prevent fall. Use of rail for support at times. Distracted. (+) walk and talk test putting pt at increased risk for falls.  Stairs            Wheelchair Mobility    Modified Rankin (Stroke Patients Only)       Balance Overall balance assessment: Needs assistance   Sitting balance-Leahy Scale: Good       Standing balance-Leahy Scale: Poor Standing balance comment: Requires constant support to maintain balance due to poor safety awareness and balance deficits. RW not used due to impaired cognition and poor problem solving.                              Pertinent Vitals/Pain Pain Assessment: No/denies pain    Home Living Family/patient expects to be discharged to:: Skilled nursing facility  Additional Comments: Reports living alone and being (I) for all ADLs. 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 Dementia and confusion as worsened.     Prior Function           Comments: pt unable to provide detailed hx 2* confusion and hx of dementia, she stated she doesn't use an AD to walk     Hand Dominance        Extremity/Trunk Assessment   Upper  Extremity Assessment: Overall WFL for tasks assessed           Lower Extremity Assessment: Generalized weakness         Communication   Communication: No difficulties  Cognition Arousal/Alertness: Awake/alert Behavior During Therapy: WFL for tasks assessed/performed Overall Cognitive Status: History of cognitive impairments - at baseline Area of Impairment: Orientation;Memory;Following commands;Safety/judgement Orientation Level: Disoriented to;Time;Situation;Place (Able to state place with increased cues and time, "hospital" but not the name. Year, "1059" )   Memory: Decreased short-term memory Following Commands: Follows multi-step commands inconsistently Safety/Judgement: Decreased awareness of safety;Decreased awareness of deficits     General Comments: Easily distracted.     General Comments      Exercises        Assessment/Plan    PT Assessment Patient needs continued PT services  PT Diagnosis Abnormality of gait;Generalized weakness   PT Problem List Decreased strength;Decreased cognition;Decreased safety awareness;Decreased balance  PT Treatment Interventions Balance training;Gait training;Cognitive remediation;Therapeutic activities;Functional mobility training;Patient/family education   PT Goals (Current goals can be found in the Care Plan section) Acute Rehab PT Goals Patient Stated Goal: none stated. PT Goal Formulation: Patient unable to participate in goal setting Time For Goal Achievement: 09/12/13 Potential to Achieve Goals: Fair    Frequency Min 2X/week   Barriers to discharge Decreased caregiver support Pt lives alone and not safe to do so.    Co-evaluation               End of Session Equipment Utilized During Treatment: Gait belt Activity Tolerance: Patient tolerated treatment well Patient left: in chair;with call bell/phone within reach;with nursing/sitter in room Nurse Communication: Mobility status         Time:  7829-5621 PT Time Calculation (min): 22 min   Charges:   PT Evaluation $Initial PT Evaluation Tier I: 1 Procedure PT Treatments $Gait Training: 8-22 mins   PT G CodesAlvie Heidelberg A 08/29/2013, 10:27 AM Alvie Heidelberg, PT, DPT [phone]

## 2013-08-29 NOTE — Progress Notes (Signed)
Pt sitting up eating breakfast in bed.  Sitter at bedside.  Pt calm, cooperative, pleasant this am.

## 2013-08-29 NOTE — Progress Notes (Signed)
[facility] PROGRESS NOTE  Marissa Chung UEA:[id number] DOB: 11/19/1933 DOA: 08/27/2013  PCP: Leanor Rubenstein, MD  Brief HPI: 80yo with PMH as below brought in due to worsening confusion. She was recently started on HCTZ for elevated BP and was noted to have low BP by EMS.  Past medical history:  Past Medical History  Diagnosis Date  . Hypertension   . Frontal lobe dementia   . History of shingles   . Cataract     BILATERAL    Consultants: None  Procedures: None  Antibiotics: None  Subjective: Patient remains confused. Was walking in hallway last night. Denies any pain. Unable to obtain any history from her.  Objective: Vital Signs  Filed Vitals:   08/28/13 1638 08/28/13 2047 08/29/13 0418 08/29/13 1014  BP: 117/80 158/78 146/64 121/59  Pulse: 82 79 67 80  Temp: 97.9 F (36.6 C) 97.8 F (36.6 C) 97.8 F (36.6 C) 98.7 F (37.1 C)  TempSrc: Oral Oral Oral Oral  Resp: Weight:      SpO2: 100% 100% 98% 100%    Intake/Output Summary (Last 24 hours) at 08/29/13 1310 Last data filed at 08/29/13 1015  Gross per 24 hour  Intake    960 ml  Output      0 ml  Net    960 ml   Filed Weights   08/27/13 2108  Weight: 65.046 kg (143 lb 6.4 oz)    General appearance: alert, cooperative, appears stated age, distracted and no distress Resp: clear to auscultation bilaterally Cardio: regular rate and rhythm, S1, S2 normal, no murmur, click, rub or gallop GI: soft, non-tender; bowel sounds normal; no masses,  no organomegaly Neurologic: Alert. Confused. No focal deficits.  Lab Results:  Basic Metabolic Panel:  Recent Labs Lab 08/25/13 2205 08/27/13 0954 08/28/13 0837  NA 141 142 142  K 3.8 3.7 3.5*  CL 105 106 105  CO2 GLUCOSE 143* 156* 103*  BUN CREATININE 0.85 0.97 0.90  CALCIUM 9.4 8.8 9.2   Liver Function Tests:  Recent Labs Lab 08/27/13 0954 08/28/13 0837  AST 15 18  ALT 10 12  ALKPHOS 57 62  BILITOT 0.4  0.5  PROT 6.4 7.1  ALBUMIN 3.2* 3.7   CBC:  Recent Labs Lab 08/25/13 2205 08/27/13 0954 08/28/13 0837  WBC 5.8 7.3 4.0  NEUTROABS 3.8 6.0  --   HGB 11.1* 10.6* 11.8*  HCT 33.8* 32.0* 36.0  MCV 86.0 85.1 87.0  PLT 153 149* 178   Cardiac Enzymes:  Recent Labs Lab 08/27/13 1502 08/27/13 2137  TROPONINI <0.30 <0.30   CBG:  Recent Labs Lab 08/28/13 0746 08/28/13 1131 08/28/13 1636 08/28/13 2040 08/29/13 0806  GLUCAP 114* 109* 105* 107* 90     Studies/Results: No results found.  Medications:  Scheduled: . amLODipine  5 mg Oral Daily  . aspirin  81 mg Oral Daily  . enoxaparin (LOVENOX) injection  40 mg Subcutaneous Q24H  . lactose free nutrition  237 mL Oral BID BM  . lisinopril  40 mg Oral Daily  . megestrol  40 mg Oral Daily  . memantine  5 mg Oral BID  . risperiDONE  0.25 mg Oral Daily  . risperiDONE  0.5 mg Oral QHS  . simvastatin  10 mg Oral QHS  . traZODone  150 mg Oral QHS   Continuous:  YNW:[id number], acetaminophen, haloperidol lactate, hydrALAZINE, ondansetron (ZOFRAN) IV, ondansetron  Assessment/Plan:  Active  Problems:   Accelerated hypertension   Dementia   Delirium   Hypotension   HTN (hypertension)   Confusion    Transient lethargy/confusion  Could be related to transient hypotension. Now she is back to baseline. CT head, CXR, labs, EKG unremarkable. Similar presentation last month, MRI/MRA was done then and negative. Do not see need to repeat MRI at this time.  H/o HTN poorly controlled Brief episode of hypotension noted per EMS. Recently started on HCTZ which could be the reason though hard to say. BP was very high subsequently and so dose of Lisinopril was increased and added Amlodipine. Bp is better now.   Dementia with behavioral problems Per her niece, Burna Mortimer, patient has been declining slowly the last few months. Niece is interested in SNF as recommended by PT/OT. Will involve CSW. Continue namenda and Risperidone per home  regimen.  Mild Hyperglycemia  HBA1c 5.9. No need for further evaluation.  Code Status: Full Code DVT Prophylaxis: lovenox  Family Communication: d/w niece Burna Mortimer over phone.  Disposition Plan: Await SNF.    LOS: 2 days   [facility]  [facility] Pager [phone] 08/29/2013, 1:10 PM  If 8PM-8AM, please contact night-coverage at [email or web address], password [email or web address]   Disclaimer: This note was dictated with voice recognition software. Similar sounding words can inadvertently be transcribed and may not be corrected upon review.

## 2013-08-30 DIAGNOSIS — F0390 Unspecified dementia without behavioral disturbance: Secondary | ICD-10-CM

## 2013-08-30 NOTE — Clinical Social Work Note (Signed)
CSW made aware patient ready for d/c to SNF by RN Camieko. CSW completed assessment with niece and faxed patient out to [facility]. SNF. CSW made RN aware there are currently no bed offers for patient andCSW will follow-up with facilities tomorrow.  Alisea Matte Patrick-Jefferson, LCSWA Weekend Clinical Social Worker [phone]

## 2013-08-30 NOTE — Discharge Summary (Signed)
[facility]  Physician Discharge Summary   Patient ID: Marissa Chung MRN: [id number] DOB/AGE: Nov 01, 1933 80 y.o.  Admit date: 08/27/2013 Discharge date: 08/30/2013  PCP: Leanor Rubenstein, MD  DISCHARGE DIAGNOSES:  Active Problems:   Accelerated hypertension   Dementia   Delirium   Hypotension   HTN (hypertension)   Confusion   RECOMMENDATIONS FOR OUTPATIENT FOLLOW UP: 1. Patient being discharged to SNF for higher level of care.  2. Anti hypertensive regimen has been modified. Please check Bmet in one week to check potassium level and renal function.  DISCHARGE CONDITION: fair  Diet recommendation: Low sodium  Filed Weights   08/27/13 2108 08/29/13 2013  Weight: 65.046 kg (143 lb 6.4 oz) 65.318 kg (144 lb)    INITIAL HISTORY: 80yo with PMH as below brought in due to worsening confusion. She was recently started on HCTZ for elevated BP and was noted to have low BP by EMS.  Consultations:  None  Procedures:  None  HOSPITAL COURSE:   Transient lethargy/confusion  Could have been related to transient hypotension. She was back to baseline quickly. CT head, CXR, labs, EKG were unremarkable. Similar presentation last month at which time MRI/MRA was done then and was negative. There was no need to repeat MRI at this time.   H/o HTN poorly controlled  She had a brief episode of hypotension noted per EMS. She was recently started on HCTZ which could be the reason though hard to say. BP was very high subsequently and so dose of Lisinopril was increased and added Amlodipine. Bp is better now. She will be discharged on her new regimen. HCTZ has been stopped.  Dementia with behavioral problems  Per her niece, Burna Mortimer, patient has been declining slowly the last few months. Niece is interested in SNF as recommended by PT/OT. CSW helping with placement. Continue namenda and Risperidone per home regimen.   Mild Hyperglycemia  HBA1c 5.9. No need for further evaluation.    Patient is at her baseline. She is stable for discharge to SNF. Attempted to call Niece, Burna Mortimer, with no success. Couldn't leave message either. Will try again later.   PERTINENT LABS:  The results of significant diagnostics from this hospitalization (including imaging, microbiology, ancillary and laboratory) are listed below for reference.     Labs: Basic Metabolic Panel:  Recent Labs Lab 08/25/13 2205 08/27/13 0954 08/28/13 0837  NA 141 142 142  K 3.8 3.7 3.5*  CL 105 106 105  CO2 GLUCOSE 143* 156* 103*  BUN CREATININE 0.85 0.97 0.90  CALCIUM 9.4 8.8 9.2   Liver Function Tests:  Recent Labs Lab 08/27/13 0954 08/28/13 0837  AST 15 18  ALT 10 12  ALKPHOS 57 62  BILITOT 0.4 0.5  PROT 6.4 7.1  ALBUMIN 3.2* 3.7   CBC:  Recent Labs Lab 08/25/13 2205 08/27/13 0954 08/28/13 0837  WBC 5.8 7.3 4.0  NEUTROABS 3.8 6.0  --   HGB 11.1* 10.6* 11.8*  HCT 33.8* 32.0* 36.0  MCV 86.0 85.1 87.0  PLT 153 149* 178   Cardiac Enzymes:  Recent Labs Lab 08/27/13 1502 08/27/13 2137  TROPONINI <0.30 <0.30   CBG:  Recent Labs Lab 08/28/13 1636 08/28/13 2040 08/29/13 0806 08/29/13 1232 08/29/13 1714  GLUCAP 105* 107* 90 123* 138*     IMAGING STUDIES Ct Head Wo Contrast  08/27/2013   CLINICAL DATA:  Dizziness and altered mental status  EXAM: CT HEAD WITHOUT CONTRAST  TECHNIQUE: Contiguous axial  images were obtained from the base of the skull through the vertex without intravenous contrast.  COMPARISON:  Head CT July 26, 2013 and brain MRI July 27, 2013  FINDINGS: There is age related volume loss. There is borderline cerebellar tonsillar ectopia. There is no appreciable mass, hemorrhage, extra-axial fluid collection, or midline shift. There is patchy small vessel disease in the centra semiovale bilaterally. No new gray-white compartment lesions identified. There is no demonstrable acute infarct.  The bony calvarium appears intact. Visualized mastoid  air cells are clear. There is opacification of several ethmoid air cells bilaterally. There is a small benign osteoma in the left anterior ethmoid complex.  IMPRESSION: Age related volume loss. Mild cerebellar tonsillar ectopia, stable. There is patchy periventricular small vessel disease. No intracranial mass, hemorrhage, or acute appearing infarct. Patchy ethmoid sinus disease bilaterally.   Electronically Signed   By: Bretta Bang M.D.   On: 08/27/2013 11:43   Dg Chest Portable 1 View  08/27/2013   CLINICAL DATA:  80 year old female altered mental status. Initial encounter.  EXAM: PORTABLE CHEST - 1 VIEW  COMPARISON:  None.  FINDINGS: Portable AP semi upright view at at 1008 hrs. Cardiac size at the upper limits of normal versus mild cardiomegaly. Other mediastinal contours are within normal limits. Visualized tracheal air column is within normal limits. Allowing for portable technique, the lungs are clear. No pneumothorax.  IMPRESSION: No acute cardiopulmonary abnormality.   Electronically Signed   By: Augusto Gamble M.D.   On: 08/27/2013 10:15    DISCHARGE EXAMINATION: Filed Vitals:   08/29/13 1812 08/29/13 2013 08/30/13 0517 08/30/13 0953  BP: 144/77 117/57 128/52 138/73  Pulse: 88 70 71 83  Temp: 99.1 F (37.3 C) 99.4 F (37.4 C) 98.8 F (37.1 C) 97.9 F (36.6 C)  TempSrc: Oral Oral Axillary Axillary  Resp: Weight:  65.318 kg (144 lb)    SpO2: 100% 99% 98% 98%   General appearance: alert, distracted and no distress Resp: clear to auscultation bilaterally Cardio: regular rate and rhythm, S1, S2 normal, no murmur, click, rub or gallop GI: soft, non-tender; bowel sounds normal; no masses,  no organomegaly Neurologic: Alert but confused. No focal deficits otherwise.  DISPOSITION: SNF  Discharge Instructions   Diet - low sodium heart healthy    Complete by:  As directed      Increase activity slowly    Complete by:  As directed            ALLERGIES:  Allergies    Allergen Reactions  . Shellfish Allergy Hives    Current Discharge Medication List    START taking these medications   Details  amLODipine (NORVASC) 5 MG tablet Take 1 tablet (5 mg total) by mouth daily. Qty: 30 tablet, Refills: 0      CONTINUE these medications which have CHANGED   Details  lisinopril (PRINIVIL,ZESTRIL) 20 MG tablet Take 2 tablets (40 mg total) by mouth daily. Qty: 60 tablet, Refills: 1    risperiDONE (RISPERDAL) 0.25 MG tablet Take 1-2 tablets (0.25-0.5 mg total) by mouth 2 (two) times daily. .25 mg am and .50 mg at bedtime Qty: 60 tablet, Refills: 1      CONTINUE these medications which have NOT CHANGED   Details  aspirin 81 MG chewable tablet Chew 1 tablet (81 mg total) by mouth daily. Qty: 30 tablet, Refills: 0    feeding supplement, ENSURE COMPLETE, (ENSURE COMPLETE) LIQD Take 237 mLs by mouth 2 (two) times  daily between meals. Qty: 60 Bottle, Refills: 0    megestrol (MEGACE) 40 MG tablet Take 1 tablet (40 mg total) by mouth daily. Qty: 30 tablet, Refills: 0    memantine (NAMENDA) 5 MG tablet Take 5 mg by mouth 2 (two) times daily.    simvastatin (ZOCOR) 10 MG tablet Take 10 mg by mouth at bedtime.    traZODone (DESYREL) 150 MG tablet Take 150 mg by mouth at bedtime.      STOP taking these medications     hydrochlorothiazide (HYDRODIURIL) 12.5 MG tablet        Follow-up Information   Follow up with Leanor Rubenstein, MD. Schedule an appointment as soon as possible for a visit in 1 week. (post hospitalization follow up)    Specialty:  Family Medicine   Contact information:   [address], [address] [place] [address] [phone]       TOTAL DISCHARGE TIME: 35 mins  [facility]  [facility] Pager [phone]  08/30/2013, 10:59 AM  Disclaimer: This note was dictated with voice recognition software. Similar sounding words can inadvertently be transcribed and may not be corrected upon review.

## 2013-08-30 NOTE — Clinical Social Work Psychosocial (Signed)
Clinical Social Work Department BRIEF PSYCHOSOCIAL ASSESSMENT 08/30/2013  Patient:  ALKA, FALWELL     Account Number:  [id number]     Admit date:  08/27/2013  Clinical Social Worker:  Earnestine Leys  Date/Time:  08/30/2013 06:16 PM  Referred by:  Physician  Date Referred:  08/30/2013 Referred for  SNF Placement   Other Referral:   Interview type:  Family Other interview type:   Patient's niece Moises Blood    PSYCHOSOCIAL DATA Living Status:  ALONE Admitted from facility:   Level of care:   Primary support name:  Moises Blood Primary support relationship to patient:  FAMILY Degree of support available:   Good    CURRENT CONCERNS Current Concerns  Post-Acute Placement   Other Concerns:    SOCIAL WORK ASSESSMENT / PLAN CSW contacted patient's niece Moises Blood and introduced self and explained role. CSW discussed SNF placement and d/c plan. Per Burna Mortimer, patient has had an RN Aide at the home 4 times a day for 4 months. Burna Mortimer stated she has been looking into ALF for patient once d/c from SNF. Burna Mortimer stated preference for [facility] as her sister works there and Film/video editor.   Assessment/plan status:  Other - See comment Other assessment/ plan:   CSW will update complete FL2 for SNF placement.   Information/referral to community resources:    PATIENT'S/FAMILY'S RESPONSE TO PLAN OF CARE: Patient's niece was cooperative and thanked CSW for assistance with d/c plan.   Taniah Reinecke Patrick-Jefferson, LCSWA Weekend Clinical Social Worker [phone]

## 2013-08-30 NOTE — Clinical Social Work Placement (Signed)
Clinical Social Work Department CLINICAL SOCIAL WORK PLACEMENT NOTE 08/30/2013  Patient:  Marissa Chung, Marissa Chung  Account Number:  [id number] Admit date:  08/27/2013  Clinical Social Worker:  Vivi Barrack, [place]  Date/time:  08/30/2013 06:20 PM  Clinical Social Work is seeking post-discharge placement for this patient at the following level of care:   SKILLED NURSING   (*CSW will update this form in Epic as items are completed)   08/30/2013  Patient/family provided with [facility] Department of Clinical Social Work's list of facilities offering this level of care within the geographic area requested by the patient (or if unable, by the patient's family).  08/30/2013  Patient/family informed of their freedom to choose among providers that offer the needed level of care, that participate in Medicare, Medicaid or managed care program needed by the patient, have an available bed and are willing to accept the patient.  08/30/2013  Patient/family informed of [facility]' ownership interest in [facility], as well as of the fact that they are under no obligation to receive care at this facility.  PASARR submitted to EDS on 08/30/2013 PASARR number received on 08/30/2013  FL2 transmitted to all facilities in geographic area requested by pt/family on  08/30/2013 FL2 transmitted to all facilities within larger geographic area on   Patient informed that his/her managed care company has contracts with or will negotiate with  certain facilities, including the following:     Patient/family informed of bed offers received:   Patient chooses bed at  Physician recommends and patient chooses bed at    Patient to be transferred to  on   Patient to be transferred to facility by  Patient and family notified of transfer on  Name of family member notified:    The following physician request were entered in Epic:   Additional Comments:  Ewel Lona Patrick-Jefferson, LCSWA Weekend  Clinical Social Worker [phone]

## 2013-08-30 NOTE — Discharge Instructions (Signed)

## 2013-08-30 NOTE — Progress Notes (Addendum)
Pt has not been discharge d/t SW waiting on bed offer from skilled nursing facilities. Dr. Rito Ehrlich made aware.

## 2013-08-31 NOTE — Clinical Social Work Note (Signed)
CSW continues to follow this patient for d/c planning needs. CSW contacted Janie of [facility], who stated facility has made bed offer. Per Wille Celeste, she can meet with family at 1:30pm to complete paperwork for admission to facility today. CSW contacted patient's niece Moises Blood) and made her aware of the above. Patient's niece inquired about other facilities. CSW informed niece at this time, [facility] has made the only bed offer and patient has been medically stable for d/c as of 8/29. CSW appropriately answered niece's questions about admission process. Patient's niece agreeable to placement at [facility] and will meet with Janie at 1:30pm. Patient's niece further stated she will transport patient to facility to keep patient's anxiety reduced. CSW prepared d/c packet and placed in patient's chart. CSW to make RN Synetta Fail) and MD aware of patient d/c to [facility] on this date.   Kashia Brossard Patrick-Jefferson, LCSWA Weekend Clinical Social Worker [phone]

## 2013-08-31 NOTE — Clinical Social Work Note (Signed)
CSW made aware by Wille Celeste, patient's niece completed paperwork and facility can admit patient on this date. CSW made RN aware of number for room and report. CSW provided RN Synetta Fail) with d/c packet for patient's niece to provide to facility as niece will be transporting patient. No further needs. CSW signing off.   Arleatha Philipps Patrick-Jefferson, LCSWA Weekend Clinical Social Worker [phone]

## 2013-08-31 NOTE — Progress Notes (Signed)
[facility] PROGRESS NOTE  [name]:[id number] DOB: 06-16-1933 DOA: 08/27/2013  PCP: Leanor Rubenstein, MD  Brief HPI: 80yo with PMH as below brought in due to worsening confusion. She was recently started on HCTZ for elevated BP and was noted to have low BP by EMS.  Past medical history:  Past Medical History  Diagnosis Date  . Hypertension   . Frontal lobe dementia   . History of shingles   . Cataract     BILATERAL    Consultants: None  Procedures: None  Antibiotics: None  Subjective: Patient remains confused as his her baseline. Unable to obtain any history from her. She wonders why she is not home yet.  Objective: Vital Signs  Filed Vitals:   08/30/13 1730 08/30/13 2117 08/30/13 2345 08/31/13 0607  BP: 150/80 158/82  144/62  Pulse: 82 89  88  Temp: 97.3 F (36.3 C) 98.1 F (36.7 C)  98.5 F (36.9 C)  TempSrc: Oral Oral  Oral  Resp: Height:    (1.6 m)   Weight:    66.1 kg (145 lb 11.6 oz)  SpO2: 100% 98%  97%   No intake or output data in the 24 hours ending 08/31/13 0949 Filed Weights   08/27/13 2108 08/29/13 2013 08/31/13 0607  Weight: 65.046 kg (143 lb 6.4 oz) 65.318 kg (144 lb) 66.1 kg (145 lb 11.6 oz)    General appearance: alert, cooperative, appears stated age, distracted and no distress Resp: clear to auscultation bilaterally Cardio: regular rate and rhythm, S1, S2 normal, no murmur, click, rub or gallop GI: soft, non-tender; bowel sounds normal; no masses,  no organomegaly Neurologic: Alert. Confused. No focal deficits.  Lab Results:  Basic Metabolic Panel:  Recent Labs Lab 08/25/13 2205 08/27/13 0954 08/28/13 0837  NA 141 142 142  K 3.8 3.7 3.5*  CL 105 106 105  CO2 GLUCOSE 143* 156* 103*  BUN CREATININE 0.85 0.97 0.90  CALCIUM 9.4 8.8 9.2   Liver Function Tests:  Recent Labs Lab 08/27/13 0954 08/28/13 0837  AST 15 18  ALT 10 12  ALKPHOS 57 62  BILITOT 0.4 0.5  PROT 6.4  7.1  ALBUMIN 3.2* 3.7   CBC:  Recent Labs Lab 08/25/13 2205 08/27/13 0954 08/28/13 0837  WBC 5.8 7.3 4.0  NEUTROABS 3.8 6.0  --   HGB 11.1* 10.6* 11.8*  HCT 33.8* 32.0* 36.0  MCV 86.0 85.1 87.0  PLT 153 149* 178   Cardiac Enzymes:  Recent Labs Lab 08/27/13 1502 08/27/13 2137  TROPONINI <0.30 <0.30   CBG:  Recent Labs Lab 08/28/13 1636 08/28/13 2040 08/29/13 0806 08/29/13 1232 08/29/13 1714  GLUCAP 105* 107* 90 123* 138*     Studies/Results: No results found.  Medications:  Scheduled: . amLODipine  5 mg Oral Daily  . aspirin  81 mg Oral Daily  . enoxaparin (LOVENOX) injection  40 mg Subcutaneous Q24H  . lactose free nutrition  237 mL Oral BID BM  . lisinopril  40 mg Oral Daily  . megestrol  40 mg Oral Daily  . memantine  5 mg Oral BID  . risperiDONE  0.25 mg Oral Daily  . risperiDONE  0.5 mg Oral QHS  . simvastatin  10 mg Oral QHS  . traZODone  150 mg Oral QHS   Continuous:  WJX:[id number], acetaminophen, haloperidol lactate, hydrALAZINE, ondansetron (ZOFRAN) IV, ondansetron  Assessment/Plan:  Active Problems:   Accelerated hypertension  Dementia   Delirium   Hypotension   HTN (hypertension)   Confusion    Transient lethargy/confusion  Could be related to transient hypotension. Now she is back to baseline. CT head, CXR, labs, EKG unremarkable. Similar presentation last month, MRI/MRA was done then and negative. Do not see need to repeat MRI at this time.  H/o HTN poorly controlled Brief episode of hypotension noted per EMS. Recently started on HCTZ which could be the reason though hard to say. BP was very high subsequently and so dose of Lisinopril was increased and added Amlodipine. Bp is better now.   Dementia with behavioral problems Per her niece, Burna Mortimer, patient has been declining slowly the last few months. Niece is interested in SNF as recommended by PT/OT. CSW following. Hopefully will be able to go to SNF today. Continue namenda  and Risperidone per home regimen.  Mild Hyperglycemia  HBA1c 5.9. No need for further evaluation.  Code Status: Full Code DVT Prophylaxis: lovenox  Family Communication: Unable to reach her niece yesterday.  Disposition Plan: Await placement to SNF.    LOS: 4 days   [facility]  [facility] Pager [phone] 08/31/2013, 9:49 AM  If 8PM-8AM, please contact night-coverage at [email or web address], password [email or web address]   Disclaimer: This note was dictated with voice recognition software. Similar sounding words can inadvertently be transcribed and may not be corrected upon review.

## 2016-03-10 ENCOUNTER — Emergency Department (HOSPITAL_COMMUNITY): Payer: Medicare Other

## 2016-03-10 ENCOUNTER — Observation Stay (HOSPITAL_COMMUNITY): Payer: Medicare Other

## 2016-03-10 ENCOUNTER — Encounter (HOSPITAL_COMMUNITY): Payer: Self-pay | Admitting: Emergency Medicine

## 2016-03-10 ENCOUNTER — Observation Stay (HOSPITAL_COMMUNITY)
Admission: EM | Admit: 2016-03-10 | Discharge: 2016-03-11 | Disposition: A | Discharge status: Skilled Nursing Facility | Payer: Medicare Other | Attending: Internal Medicine | Admitting: Internal Medicine

## 2016-03-10 DIAGNOSIS — I10 Essential (primary) hypertension: Secondary | ICD-10-CM | POA: Diagnosis present

## 2016-03-10 DIAGNOSIS — Z87891 Personal history of nicotine dependence: Secondary | ICD-10-CM | POA: Diagnosis not present

## 2016-03-10 DIAGNOSIS — I6521 Occlusion and stenosis of right carotid artery: Secondary | ICD-10-CM | POA: Insufficient documentation

## 2016-03-10 DIAGNOSIS — D649 Anemia, unspecified: Secondary | ICD-10-CM | POA: Diagnosis not present

## 2016-03-10 DIAGNOSIS — G3109 Other frontotemporal dementia: Secondary | ICD-10-CM

## 2016-03-10 DIAGNOSIS — F0390 Unspecified dementia without behavioral disturbance: Secondary | ICD-10-CM | POA: Insufficient documentation

## 2016-03-10 DIAGNOSIS — E785 Hyperlipidemia, unspecified: Secondary | ICD-10-CM | POA: Diagnosis present

## 2016-03-10 DIAGNOSIS — R4182 Altered mental status, unspecified: Secondary | ICD-10-CM | POA: Diagnosis present

## 2016-03-10 DIAGNOSIS — I639 Cerebral infarction, unspecified: Secondary | ICD-10-CM

## 2016-03-10 DIAGNOSIS — R29810 Facial weakness: Secondary | ICD-10-CM

## 2016-03-10 DIAGNOSIS — Z8673 Personal history of transient ischemic attack (TIA), and cerebral infarction without residual deficits: Secondary | ICD-10-CM | POA: Diagnosis not present

## 2016-03-10 DIAGNOSIS — Z79818 Long term (current) use of other agents affecting estrogen receptors and estrogen levels: Secondary | ICD-10-CM | POA: Diagnosis not present

## 2016-03-10 DIAGNOSIS — F0280 Dementia in other diseases classified elsewhere without behavioral disturbance: Secondary | ICD-10-CM | POA: Diagnosis not present

## 2016-03-10 DIAGNOSIS — R55 Syncope and collapse: Principal | ICD-10-CM

## 2016-03-10 DIAGNOSIS — F0150 Vascular dementia without behavioral disturbance: Secondary | ICD-10-CM | POA: Diagnosis not present

## 2016-03-10 DIAGNOSIS — G8194 Hemiplegia, unspecified affecting left nondominant side: Secondary | ICD-10-CM | POA: Diagnosis not present

## 2016-03-10 DIAGNOSIS — G9340 Encephalopathy, unspecified: Secondary | ICD-10-CM | POA: Diagnosis not present

## 2016-03-10 DIAGNOSIS — R911 Solitary pulmonary nodule: Secondary | ICD-10-CM | POA: Diagnosis not present

## 2016-03-10 DIAGNOSIS — Z7982 Long term (current) use of aspirin: Secondary | ICD-10-CM | POA: Diagnosis not present

## 2016-03-10 DIAGNOSIS — Z9181 History of falling: Secondary | ICD-10-CM | POA: Insufficient documentation

## 2016-03-10 DIAGNOSIS — Z79899 Other long term (current) drug therapy: Secondary | ICD-10-CM | POA: Diagnosis not present

## 2016-03-10 LAB — URINALYSIS, ROUTINE W REFLEX MICROSCOPIC
BILIRUBIN URINE: NEGATIVE
Glucose, UA: NEGATIVE mg/dL
HGB URINE DIPSTICK: NEGATIVE
KETONES UR: NEGATIVE mg/dL
NITRITE: NEGATIVE
Protein, ur: NEGATIVE mg/dL
SPECIFIC GRAVITY, URINE: 1.009 (ref 1.005–1.030)
pH: 6 (ref 5.0–8.0)

## 2016-03-10 LAB — CBC
HCT: 33.6 % — ABNORMAL LOW (ref 36.0–46.0)
Hemoglobin: 10.7 g/dL — ABNORMAL LOW (ref 12.0–15.0)
MCH: 27.6 pg (ref 26.0–34.0)
MCHC: 31.8 g/dL (ref 30.0–36.0)
MCV: 86.6 fL (ref 78.0–100.0)
PLATELETS: 169 10*3/uL (ref 150–400)
RBC: 3.88 MIL/uL (ref 3.87–5.11)
RDW: 14.8 % (ref 11.5–15.5)
WBC: 4.9 10*3/uL (ref 4.0–10.5)

## 2016-03-10 LAB — RAPID URINE DRUG SCREEN, HOSP PERFORMED
Amphetamines: NOT DETECTED
Barbiturates: NOT DETECTED
Benzodiazepines: NOT DETECTED
COCAINE: NOT DETECTED
OPIATES: NOT DETECTED
TETRAHYDROCANNABINOL: NOT DETECTED

## 2016-03-10 LAB — PROTIME-INR
INR: 1.07
PROTHROMBIN TIME: 14 s (ref 11.4–15.2)

## 2016-03-10 LAB — COMPREHENSIVE METABOLIC PANEL
ALBUMIN: 3.3 g/dL — AB (ref 3.5–5.0)
ALK PHOS: 64 U/L (ref 38–126)
ALT: 52 U/L (ref 14–54)
ANION GAP: 5 (ref 5–15)
AST: 65 U/L — ABNORMAL HIGH (ref 15–41)
BUN: 16 mg/dL (ref 6–20)
CHLORIDE: 106 mmol/L (ref 101–111)
CO2: 28 mmol/L (ref 22–32)
Calcium: 9.3 mg/dL (ref 8.9–10.3)
Creatinine, Ser: 0.82 mg/dL (ref 0.44–1.00)
GFR calc non Af Amer: >60 mL/min (ref >60)
GLUCOSE: 100 mg/dL — AB (ref 65–99)
Potassium: 4.7 mmol/L (ref 3.5–5.1)
SODIUM: 139 mmol/L (ref 135–145)
Total Bilirubin: 0.3 mg/dL (ref 0.3–1.2)
Total Protein: 7.1 g/dL (ref 6.5–8.1)

## 2016-03-10 LAB — CBG MONITORING, ED
GLUCOSE-CAPILLARY: 90 mg/dL (ref 65–99)
Glucose-Capillary: 86 mg/dL (ref 65–99)

## 2016-03-10 LAB — FERRITIN: FERRITIN: 139 ng/mL (ref 11–307)

## 2016-03-10 LAB — DIFFERENTIAL
BASOS PCT: 0 %
Basophils Absolute: 0 10*3/uL (ref 0.0–0.1)
EOS PCT: 1 %
Eosinophils Absolute: 0.1 10*3/uL (ref 0.0–0.7)
LYMPHS PCT: 25 %
Lymphs Abs: 1.2 10*3/uL (ref 0.7–4.0)
MONO ABS: 0.7 10*3/uL (ref 0.1–1.0)
Monocytes Relative: 13 %
NEUTROS ABS: 3 10*3/uL (ref 1.7–7.7)
NEUTROS PCT: 61 %

## 2016-03-10 LAB — VITAMIN B12: Vitamin B-12: 551 pg/mL (ref 180–914)

## 2016-03-10 LAB — IRON AND TIBC
Iron: 84 ug/dL (ref 28–170)
Saturation Ratios: 27 % (ref 10.4–31.8)
TIBC: 314 ug/dL (ref 250–450)
UIBC: 230 ug/dL

## 2016-03-10 LAB — I-STAT CHEM 8, ED
BUN: 20 mg/dL (ref 6–20)
CALCIUM ION: 1.18 mmol/L (ref 1.15–1.40)
Chloride: 105 mmol/L (ref 101–111)
Creatinine, Ser: 1 mg/dL (ref 0.44–1.00)
Glucose, Bld: 94 mg/dL (ref 65–99)
HEMATOCRIT: 35 % — AB (ref 36.0–46.0)
HEMOGLOBIN: 11.9 g/dL — AB (ref 12.0–15.0)
Potassium: 4.9 mmol/L (ref 3.5–5.1)
SODIUM: 142 mmol/L (ref 135–145)
TCO2: 31 mmol/L (ref 0–100)

## 2016-03-10 LAB — FOLATE: Folate: 13.8 ng/mL (ref >5.9)

## 2016-03-10 LAB — APTT: aPTT: 27 seconds (ref 24–36)

## 2016-03-10 LAB — RETICULOCYTES
RBC.: 3.93 MIL/uL (ref 3.87–5.11)
RETIC COUNT ABSOLUTE: 39.3 10*3/uL (ref 19.0–186.0)
RETIC CT PCT: 1 % (ref 0.4–3.1)

## 2016-03-10 LAB — I-STAT TROPONIN, ED: Troponin i, poc: 0 ng/mL (ref 0.00–0.08)

## 2016-03-10 LAB — ETHANOL

## 2016-03-10 MED ORDER — LISINOPRIL 20 MG PO TABS
20.0000 mg | ORAL_TABLET | Freq: Every day | ORAL | Status: DC
  Administered 2016-03-11: 20 mg via ORAL
  Filled 2016-03-10: qty 1

## 2016-03-10 MED ORDER — ASPIRIN 300 MG RE SUPP: 300.0000 mg | Freq: Every day | RECTAL | Status: DC

## 2016-03-10 MED ORDER — RIVASTIGMINE 9.5 MG/24HR TD PT24
9.5000 mg | MEDICATED_PATCH | Freq: Every day | TRANSDERMAL | Status: DC
  Administered 2016-03-11: 9.5 mg via TRANSDERMAL
  Filled 2016-03-10: qty 1

## 2016-03-10 MED ORDER — ASPIRIN 325 MG PO TABS
325.0000 mg | ORAL_TABLET | Freq: Every day | ORAL | Status: DC
  Administered 2016-03-11: 325 mg via ORAL
  Filled 2016-03-10: qty 1

## 2016-03-10 MED ORDER — LACTULOSE 10 GM/15ML PO SOLN
20.0000 g | Freq: Two times a day (BID) | ORAL | Status: DC
  Administered 2016-03-10 – 2016-03-11 (×2): 20 g via ORAL
  Filled 2016-03-10 (×3): qty 30

## 2016-03-10 MED ORDER — ENSURE ENLIVE PO LIQD
237.0000 mL | Freq: Two times a day (BID) | ORAL | Status: DC
  Administered 2016-03-11 (×2): 237 mL via ORAL

## 2016-03-10 MED ORDER — RISPERIDONE 1 MG PO TABS
1.0000 mg | ORAL_TABLET | Freq: Every day | ORAL | Status: DC
  Administered 2016-03-10: 1 mg via ORAL
  Filled 2016-03-10 (×2): qty 1

## 2016-03-10 MED ORDER — STROKE: EARLY STAGES OF RECOVERY BOOK
Freq: Once | Status: DC
  Filled 2016-03-10: qty 1

## 2016-03-10 MED ORDER — MEMANTINE HCL 5 MG PO TABS
5.0000 mg | ORAL_TABLET | Freq: Two times a day (BID) | ORAL | Status: DC
  Administered 2016-03-10 – 2016-03-11 (×2): 5 mg via ORAL
  Filled 2016-03-10 (×3): qty 1

## 2016-03-10 MED ORDER — OLANZAPINE 2.5 MG PO TABS
2.5000 mg | ORAL_TABLET | Freq: Every day | ORAL | Status: DC
  Administered 2016-03-10: 2.5 mg via ORAL
  Filled 2016-03-10 (×2): qty 1

## 2016-03-10 NOTE — ED Notes (Signed)
Pt CBG was 90, notified Chrislyn(RN)

## 2016-03-10 NOTE — Progress Notes (Signed)
New Admission Note:  Arrival Method: from ER [place] Mental Orientation: Alert & oriented  Telemetry: [id number] Assessment: Completed Skin: assessment complete  IV: R & l AC  Pain:0/10 Tubes:n/a Safety Measures: Safety Fall Prevention Plan was given, discussed Admission: Completed 5M18: Patient has been orientated to the room, unit and the staff. Family:visiting   Orders have been reviewed and implemented. Will continue to monitor the patient. Call light has been placed within reach and bed alarm has been activated.   Lawernce IonYari Seena Face ,RN

## 2016-03-10 NOTE — ED Notes (Signed)
Per [place] [facility]Yvette at [facility] pt was aphasic at breakfast, drooling from L side of mouth, "did a couple of jerking movements, like twitching with her arm" [left arm].  LSN 0830

## 2016-03-10 NOTE — ED Provider Notes (Signed)
[facility]-EMERGENCY DEPT Provider Note   CSN: [id number] Arrival date & time: 03/10/16  1056     History   Chief Complaint Chief Complaint  Patient presents with  . Altered Mental Status    HPI Marissa Chung is a 83 y.o. female.  The history is provided by medical records, a relative and the nursing home. No language interpreter was used.  Altered Mental Status     Marissa Chung is a 83 y.o. female  with a PMH of dementia, HTN, prior TIA presents to the Emergency Department from [facility] facility for alerted mental status since approx. 8:30 am. Per facility, patient came to breakfast as usual. She was sitting at the table when she suddenly started drooling out of the left side of her mouth and slumped over. Nursing staff also reported left leg weakness and noted that patient was unable to respond to any commands. Per niece at bedside, she spoke with the patient yesterday and she was very talkative, telling her about what she ate for dinner and interactive. Her mental status now seems much different from baseline per family.  Will 5 caveat applies secondary to acuity of condition, dementia.  Past Medical History:  Diagnosis Date  . Cataract    BILATERAL  . Frontal lobe dementia   . History of shingles   . Hypertension     Patient Active Problem List   Diagnosis Date Noted  . Delirium 08/27/2013  . Hypotension 08/27/2013  . HTN (hypertension) 08/27/2013  . Confusion 08/27/2013  . TIA (transient ischemic attack) 07/26/2013  . Accelerated hypertension 07/26/2013  . Dementia 07/26/2013  . Anemia 07/26/2013  . Dyslipidemia 07/26/2013    Past Surgical History:  Procedure Laterality Date  . FINGER SURGERY Right    pinky finger    OB History    No data available       Home Medications    Prior to Admission medications   Medication Sig Start Date End Date Taking? Authorizing Provider  amLODipine (NORVASC) 5 MG tablet Take 1 tablet (5 mg total) by mouth daily.  08/29/13   Osvaldo Shipper, MD  aspirin 81 MG chewable tablet Chew 1 tablet (81 mg total) by mouth daily. 07/28/13   Nishant Dhungel, MD  feeding supplement, ENSURE COMPLETE, (ENSURE COMPLETE) LIQD Take 237 mLs by mouth 2 (two) times daily between meals. 07/28/13   Nishant Dhungel, MD  lisinopril (PRINIVIL,ZESTRIL) 20 MG tablet Take 2 tablets (40 mg total) by mouth daily. 08/29/13   Osvaldo Shipper, MD  megestrol (MEGACE) 40 MG tablet Take 1 tablet (40 mg total) by mouth daily. 07/28/13   Nishant Dhungel, MD  memantine (NAMENDA) 5 MG tablet Take 5 mg by mouth 2 (two) times daily.    Historical Provider, MD  risperiDONE (RISPERDAL) 0.25 MG tablet Take 1-2 tablets (0.25-0.5 mg total) by mouth 2 (two) times daily. .25 mg am and .50 mg at bedtime 08/29/13   Osvaldo Shipper, MD  simvastatin (ZOCOR) 10 MG tablet Take 10 mg by mouth at bedtime.    Historical Provider, MD  traZODone (DESYREL) 150 MG tablet Take 150 mg by mouth at bedtime.    Historical Provider, MD    Family History No family history on file.  Social History Social History  Substance Use Topics  . Smoking status: Former Games developer  . Smokeless tobacco: Never Used     Comment: quit smoking over 20 years ago   . Alcohol use No     Allergies   Shellfish allergy  Review of Systems Review of Systems  Unable to perform ROS: Acuity of condition (Dementia)     Physical Exam Updated Vital Signs BP 170/82 (BP Location: Right Arm)   Pulse 70   Temp 98.8 F (37.1 C) (Oral)   Resp 18   Ht 5' 2.99" (1.6 m)   Wt 67.1 kg   SpO2 100%   BMI 26.21 kg/m   Physical Exam  Constitutional: She appears well-developed and well-nourished. No distress.  HENT:  Head: Normocephalic and atraumatic.  Cardiovascular: Normal rate, regular rhythm and normal heart sounds.   No murmur heard. Pulmonary/Chest: Effort normal and breath sounds normal. No respiratory distress.  Abdominal: Soft. She exhibits no distension. There is no tenderness.    Musculoskeletal: She exhibits no edema.  Neurological:  Alert. Not oriented to person or place. Intermittently will follow basic commands. Mild left sided facial droop. Good grip strength bilaterally. Moves all four extremities well.   Skin: Skin is warm and dry.  Nursing note and vitals reviewed.    ED Treatments / Results  Labs (all labs ordered are listed, but only abnormal results are displayed) Labs Reviewed  CBC - Abnormal; Notable for the following:       Result Value   Hemoglobin 10.7 (*)    HCT 33.6 (*)    All other components within normal limits  COMPREHENSIVE METABOLIC PANEL - Abnormal; Notable for the following:    Glucose, Bld 100 (*)    Albumin 3.3 (*)    AST 65 (*)    All other components within normal limits  URINALYSIS, ROUTINE W REFLEX MICROSCOPIC - Abnormal; Notable for the following:    Leukocytes, UA MODERATE (*)    Bacteria, UA RARE (*)    Squamous Epithelial / LPF 0-5 (*)    All other components within normal limits  I-STAT CHEM 8, ED - Abnormal; Notable for the following:    Hemoglobin 11.9 (*)    HCT 35.0 (*)    All other components within normal limits  PROTIME-INR  APTT  DIFFERENTIAL  ETHANOL  RAPID URINE DRUG SCREEN, HOSP PERFORMED  I-STAT TROPOININ, ED  CBG MONITORING, ED  CBG MONITORING, ED    EKG  EKG Interpretation  Date/Time:  Friday March 10 2016 11:15:36 EST Ventricular Rate:  69 PR Interval:    QRS Duration: 107 QT Interval:  385 QTC Calculation: 413 R Axis:   55 Text Interpretation:  Normal sinus rhythm Low voltage, precordial leads similar to Aug 2015 Confirmed by GOLDSTON MD, SCOTT [phone]) on 03/10/2016 11:17:56 AM       Radiology Ct Head Code Stroke Wo Contrast`  Result Date: 03/10/2016 CLINICAL DATA:  Code stroke. Unresponsive episode. Left-sided weakness and left facial droop. EXAM: CT HEAD WITHOUT CONTRAST TECHNIQUE: Contiguous axial images were obtained from the base of the skull through the vertex without  intravenous contrast. COMPARISON:  08/27/2013 FINDINGS: Brain: No evidence of acute infarction, hemorrhage, hydrocephalus, extra-axial collection or mass lesion/mass effect. Mild microvascular ischemic change in the cerebral white matter. Normal brain volume for age. Vascular: Atherosclerotic calcification.  No hyperdense vessel. Skull: Negative. Sinuses/Orbits: Negative Other: Text page with results sent 03/10/2016 at 11:43 am to Dr. Roxy Manns. ASPECTS [facility] Stroke Program Early CT Score) - Ganglionic level infarction (caudate, lentiform nuclei, internal capsule, insula, M1-M3 cortex): 7 - Supraganglionic infarction (M4-M6 cortex): 3 Total score (0-10 with 10 being normal): 10 IMPRESSION: No acute finding or change from prior. ASPECTS is 10. Electronically Signed   By: Marnee Spring  M.D.   On: 03/10/2016 11:46    Procedures Procedures (including critical care time)  Medications Ordered in ED Medications   stroke: mapping our early stages of recovery book (not administered)  aspirin suppository 300 mg (not administered)    Or  aspirin tablet 325 mg (not administered)     Initial Impression / Assessment and Plan / ED Course  I have reviewed the triage vital signs and the nursing notes.  Pertinent labs & imaging results that were available during my care of the patient were reviewed by me and considered in my medical decision making (see chart for details).    Pat Patrickrcelle Wysocki is a 83 y.o. female who presents to ED for altered mental status, last known normal 8:30 am. Nursing facility endorses left leg weakness, left facial droop and drooling from left side of the mouth at onset. Upon arrival, patient appeared confused and continually asking where she is and how she got here. Mild left-sided facial droop, but no other neuro deficits appreciated. Code stroke activated. CT head negative. Neurology evaluated patient and recommends medical admission for further evaluation of possible TIA vs. Stroke vs  seizure. Hospitalist consulted who will admit.   Patient seen by and discussed with Dr. Criss AlvineGoldston who agrees with treatment plan.   Final Clinical Impressions(s) / ED Diagnoses   Final diagnoses:  Facial droop  Acute left hemiparesis [facility](HCC)    New Prescriptions New Prescriptions   No medications on file       [facility] Community HospitalJaime Pilcher Robet Crutchfield, PA-C 03/10/16 1317    Pricilla LovelessScott Goldston, MD 03/10/16 1750

## 2016-03-10 NOTE — ED Notes (Signed)
Neurologist at bedside. 

## 2016-03-10 NOTE — H&P (Signed)
4  History and Physical    Lora Chavers ZOX:[id number] DOB: 06-Feb-1933 DOA: 03/10/2016  PCP: Leanor Rubenstein, MD   Patient coming from: Assisted Living Facility  Chief Complaint: unresponsiveness, facial droop, drooling  HPI: Marissa Chung is a 83 y.o. female with medical history significant of Dementia, HTN, dyslipidemia who presented to the ED via EMS with confusion and AMS. Patient was last seen well at 9 am. Shew was sitting at the breakfast table in the dining hall when all of a sudden she became unresponsive developed some drooling from the left side of the mouse and had some twitching movements of th left arm. Soon after patient slumped over the table and became unresponsive. EMS arrived and brought patient to the ED for evaluation.  ED Course: Upon arrival to the Ed patient was noted to have a facial droop, left leg weakness and was initially confused. Code stroke was activated and she uunderwent stat head CT that did not reveal any acute findings VS were stable, blood work revealed mild anemia, otherwise unremarkable EKG revealed SR without acute changes   Review of Systems: As per HPI otherwise 10 point review of systems negative.   Ambulatory Status: Independent  Past Medical History:  Diagnosis Date  . Cataract    BILATERAL  . Frontal lobe dementia   . History of shingles   . Hypertension     Past Surgical History:  Procedure Laterality Date  . FINGER SURGERY Right    pinky finger    Social History   Social History  . Marital status: Widowed    Spouse name: N/A  . Number of children: N/A  . Years of education: N/A   Occupational History  . Not on file.   Social History Main Topics  . Smoking status: Former Games developer  . Smokeless tobacco: Never Used     Comment: quit smoking over 20 years ago   . Alcohol use No  . Drug use: No  . Sexual activity: Not on file   Other Topics Concern  . Not on file   Social History Narrative  . No narrative on file     Allergies  Allergen Reactions  . Shellfish Allergy Hives    No family history on file.  Prior to Admission medications   Medication Sig Start Date End Date Taking? Authorizing Provider  amLODipine (NORVASC) 5 MG tablet Take 1 tablet (5 mg total) by mouth daily. 08/29/13   Osvaldo Shipper, MD  aspirin 81 MG chewable tablet Chew 1 tablet (81 mg total) by mouth daily. 07/28/13   Nishant Dhungel, MD  feeding supplement, ENSURE COMPLETE, (ENSURE COMPLETE) LIQD Take 237 mLs by mouth 2 (two) times daily between meals. 07/28/13   Nishant Dhungel, MD  lisinopril (PRINIVIL,ZESTRIL) 20 MG tablet Take 2 tablets (40 mg total) by mouth daily. 08/29/13   Osvaldo Shipper, MD  megestrol (MEGACE) 40 MG tablet Take 1 tablet (40 mg total) by mouth daily. 07/28/13   Nishant Dhungel, MD  memantine (NAMENDA) 5 MG tablet Take 5 mg by mouth 2 (two) times daily.    Historical Provider, MD  risperiDONE (RISPERDAL) 0.25 MG tablet Take 1-2 tablets (0.25-0.5 mg total) by mouth 2 (two) times daily. .25 mg am and .50 mg at bedtime 08/29/13   Osvaldo Shipper, MD  simvastatin (ZOCOR) 10 MG tablet Take 10 mg by mouth at bedtime.    Historical Provider, MD  traZODone (DESYREL) 150 MG tablet Take 150 mg by mouth at bedtime.    Historical Provider,  MD    Physical Exam: Vitals:   03/10/16 1215 03/10/16 1245 03/10/16 1315 03/10/16 1345  BP: 176/89 159/83 154/76 152/96  Pulse: 76 70 65 80  Resp: 19 17 15 17   Temp:      TempSrc:      SpO2: 100% 100% 100% 100%  Weight:      Height:         General: Appears calm and comfortable Eyes: PERRLA, EOMI, normal lids, iris ENT:  grossly normal hearing, lips & tongue, mucous membranes moist and intact, mild flattening of the left nasolabial fold Neck: no lymphoadenopathy, masses or thyromegaly Cardiovascular: RRR, no m/r/g. No JVD, carotid bruits. No LE edema.  Respiratory: bilateral no wheezes, rales, rhonchi or cracles. Normal respiratory effort. No accessory muscle use  observed Abdomen: soft, non-tender, non-distended, no organomegaly or masses appreciated. BS present in all quadrants Skin: no rash, ulcers or induration seen on limited exam Musculoskeletal: grossly normal tone BUE/BLE, good ROM, no bony abnormality or joint deformities observed Psychiatric: grossly normal mood and affect, speech fluent and appropriate, alert and oriented x3 Neurologic: CN II-XII grossly intact, moves all extremities in coordinated fashion, sensation intact  Labs on Admission: I have personally reviewed following labs and imaging studies  CBC, BMP  GFR: Estimated Creatinine Clearance: 41.3 mL/min (by C-G formula based on SCr of 1 mg/dL).   Creatinine Clearance: Estimated Creatinine Clearance: 41.3 mL/min (by C-G formula based on SCr of 1 mg/dL).    Radiological Exams on Admission: Ct Head Code Stroke Wo Contrast`  Result Date: 03/10/2016 CLINICAL DATA:  Code stroke. Unresponsive episode. Left-sided weakness and left facial droop. EXAM: CT HEAD WITHOUT CONTRAST TECHNIQUE: Contiguous axial images were obtained from the base of the skull through the vertex without intravenous contrast. COMPARISON:  08/27/2013 FINDINGS: Brain: No evidence of acute infarction, hemorrhage, hydrocephalus, extra-axial collection or mass lesion/mass effect. Mild microvascular ischemic change in the cerebral white matter. Normal brain volume for age. Vascular: Atherosclerotic calcification.  No hyperdense vessel. Skull: Negative. Sinuses/Orbits: Negative Other: Text page with results sent 03/10/2016 at 11:43 am to Dr. Roxy Manns. ASPECTS [facility] Stroke Program Early CT Score) - Ganglionic level infarction (caudate, lentiform nuclei, internal capsule, insula, M1-M3 cortex): 7 - Supraganglionic infarction (M4-M6 cortex): 3 Total score (0-10 with 10 being normal): 10 IMPRESSION: No acute finding or change from prior. ASPECTS is 10. Electronically Signed   By: Marnee Spring M.D.   On: 03/10/2016 11:46     EKG: Independently reviewed - sinus rhythm, no acute changes  Assessment/Plan Principal Problem:   Syncope and collapse Active Problems:   Dementia   Anemia   Dyslipidemia   Acute encephalopathy   HTN (hypertension)    Syncope - unclear etiology - possible stroke / TIA vs Seizure Her symptoms improved  And she was not given tPA Patient was seen by neurology recommended full stroke work up and EEG Patient has permissive HTN with SBP >220 and DBP >110 mmhg Continue statin for goal LDL<70 Monitor Glucose to avoid hyperglycemia  Acute encephalopathy - patient had increased sleepiness and some confusion earlier this am in the facility Most likely associated with dementia and underlying age related CVD UA was ordered to r/o underlying infection as a causative factor of metabolic encephalopathy CMP demonstrated mild elevation of AST  Anemia - will obtain Iron panel and B12, folate level Continue to monitor Hgb and Hct  Dementia - suspected frontotemporal Provide supportive care and monitor for safety  Dyslipidemia Continue statin    DVT  prophylaxis: SCD Code Status: full Family Communication: at bedside Disposition Plan: telemetry Consults called: neurology Admission status: observation   Raymon MuttonMarina Kyazimova, [place] JerseyPA-C Pager: [phone] [facility]  If 7PM-7AM, please contact night-coverage [email or web address] Password [email or web address]  03/10/2016, 2:16 PM

## 2016-03-10 NOTE — Progress Notes (Signed)
EEG Completed; Results Pending  

## 2016-03-10 NOTE — Procedures (Signed)
Electroencephalogram (EEG) Report  Date of study: 03/10/16  Requesting clinician: Versie Starksimothy James Dannon Nguyenthi, M.D.  Reason for study: Evaluate for possible seizure  Brief clinical history: This is an 83 year old woman with history of dementia. She is admitted with increased altered mental status. EEG is being performed for further evaluation.  Medications:  Current Facility-Administered Medications:  .   stroke: mapping our early stages of recovery book, , Does not apply, Once, Versie Starksimothy James Salam Chesterfield, MD .  aspirin suppository 300 mg, 300 mg, Rectal, Daily **OR** aspirin tablet 325 mg, 325 mg, Oral, Daily, Versie Starksimothy James Owen Pratte, MD .  Melene Muller[START ON 03/11/2016] feeding supplement (ENSURE ENLIVE) (ENSURE ENLIVE) liquid 237 mL, 237 mL, Oral, BID BM, Marina S Kyazimova, PA-C .  lactulose (CHRONULAC) 10 GM/15ML solution 20 g, 20 g, Oral, BID, Marina S Kyazimova, PA-C .  [START ON 03/11/2016] lisinopril (PRINIVIL,ZESTRIL) tablet 20 mg, 20 mg, Oral, Daily, Marina S Kyazimova, PA-C .  memantine [facility](NAMENDA) tablet 5 mg, 5 mg, Oral, BID, Marina S Kyazimova, PA-C .  OLANZapine (ZYPREXA) tablet 2.5 mg, 2.5 mg, Oral, QHS, Marina S Kyazimova, PA-C .  risperiDONE (RISPERDAL) tablet 1 mg, 1 mg, Oral, QHS, Marina S Kyazimova, PA-C .  [START ON 03/11/2016] rivastigmine (EXELON) 9.5 mg/24hr 9.5 mg, 9.5 mg, Transdermal, Daily, Isaiah BlakesMarina S Kyazimova, PA-C  Description: This is a routine EEG performed using standard international 10-20 electrode placement. A total of 18 channels are recorded, including one for the EKG. The patient was awake throughout the recording.   Activating Maneuvers: None  Findings:  The EKG channel demonstrates a regular rhythm with a rate of 70 beats per minute.   The background consists of moderate diffuse generalized slowing with average frequency 4-6 hertz. At times, higher theta frequencies are noted as high as 7 Hz. The background is reactive with eye opening.  There are no focal asymmetries. No  epileptiform discharges are present. No seizures are recorded.    Impression:  This is an abnormal EEG due to moderate diffuse generalized slowing. No evidence of epileptiform activity or seizure.  Clinical correlation: This pattern is nonspecific and indicates global cerebral dysfunction. This can be seen with neurodegenerative disorders, hypoxic ischemic brain injury, metabolic derangements, and medication effect. In this case, she has a known history of dementia which may be underlying her slowing.   Rhona Leavensimothy Rainee Sweatt, MD [facility]

## 2016-03-10 NOTE — ED Notes (Signed)
EKG given to Dr. Goldston  

## 2016-03-10 NOTE — ED Notes (Signed)
ED Provider at bedside. 

## 2016-03-10 NOTE — ED Notes (Signed)
Pt CBG was 86, notified Chrislyn(RN)

## 2016-03-10 NOTE — Consult Note (Signed)
Neurology Consult Note  Reason for Consultation: CODE STROKE  Requesting provider: Pricilla LovelessScott Goldston, MD  CC: Patient has no complaint  HPI: This is an 83-yo RH woman with h/o dementia who now presents from her nursing facility for evaluation of altered mental status. History is obtained from ED staff, chart, and the patient's niece who is present at the bedside.   She was sitting in the dining room at her nursing facility this morning when she was noted to be more lethargic than usual. Per ED RN note, she was at [facility] when she was "aphasic...drooling from L side of mouth, 'did a couple of jerking movements, like twitching with her arm' [left arm]." EMS was activated and states that SNF staff did not report any focal deficit. However, in the ED, she was noted to have a left facial droop, left leg drift, and confusion. CODE STROKE was activated and she was taken for an emergent CTH which showed no acute abnormality. Her deficits largely resolved in the ED and on my exam she has a subtle L facial droop but little else.   Her niece at the bedside reports that she has a history of dementia. She has poor short-term memory and has frequent visual hallucinations. Her niece reports that she has good days where she is alert and interactive with intermixed bad days where she is lethargic and does very little. She is independent with basic ADLs. She reports that the patient is hard of hearing and has cataracts which affect her vision.   Last known well: 0900 NHISS score: 1 on my exam (mild facial droop) tPA given?: No, symptoms improved   PMH:  Past Medical History:  Diagnosis Date  . Cataract    BILATERAL  . Frontal lobe dementia   . History of shingles   . Hypertension     PSH:  Past Surgical History:  Procedure Laterality Date  . FINGER SURGERY Right    pinky finger    Family history: No family history on file.  Social history:  Social History   Social History  .  Marital status: Widowed    Spouse name: N/A  . Number of children: N/A  . Years of education: N/A   Occupational History  . Not on file.   Social History Main Topics  . Smoking status: Former Games developermoker  . Smokeless tobacco: Never Used     Comment: quit smoking over 20 years ago   . Alcohol use No  . Drug use: No  . Sexual activity: Not on file   Other Topics Concern  . Not on file   Social History Narrative  . No narrative on file    Current outpatient meds: Medications reviewed and reconciled No outpatient prescriptions have been marked as taking for the 03/10/16 encounter [facility](Hospital Encounter).    Current inpatient meds: Medications reviewed and reconciled No current facility-administered medications for this encounter.    Current Outpatient Prescriptions  Medication Sig Dispense Refill  . amLODipine (NORVASC) 5 MG tablet Take 1 tablet (5 mg total) by mouth daily. 30 tablet 0  . aspirin 81 MG chewable tablet Chew 1 tablet (81 mg total) by mouth daily. 30 tablet 0  . feeding supplement, ENSURE COMPLETE, (ENSURE COMPLETE) LIQD Take 237 mLs by mouth 2 (two) times daily between meals. 60 Bottle 0  . lisinopril (PRINIVIL,ZESTRIL) 20 MG tablet Take 2 tablets (40 mg total) by mouth daily. 60 tablet 1  . megestrol (MEGACE) 40 MG tablet Take 1 tablet (  40 mg total) by mouth daily. 30 tablet 0  . memantine (NAMENDA) 5 MG tablet Take 5 mg by mouth 2 (two) times daily.    . risperiDONE (RISPERDAL) 0.25 MG tablet Take 1-2 tablets (0.25-0.5 mg total) by mouth 2 (two) times daily. .25 mg am and .50 mg at bedtime 60 tablet 1  . simvastatin (ZOCOR) 10 MG tablet Take 10 mg by mouth at bedtime.    . traZODone (DESYREL) 150 MG tablet Take 150 mg by mouth at bedtime.      Allergies: Allergies  Allergen Reactions  . Shellfish Allergy Hives    ROS: As per HPI. A full 14-point review of systems was performed and is otherwise unremarkable.   PE:  BP 170/82 (BP Location: Right Arm)   Pulse 70    Temp 98.8 F (37.1 C) (Oral)   Resp 18   Ht 5' 2.99" (1.6 m)   Wt 67.1 kg (147 lb 14.9 oz)   SpO2 100%   BMI 26.21 kg/m   General: WDWN elderly AA woman lying on ED gurney. She is alert, oriented to self. She is in no acute distress but is a bit sleepy. Speech clear, no dysarthria when gives good effort. No aphasia. Follows commands briskly. Affect is flat.  HEENT: Normocephalic. Neck supple without LAD. MMM, OP clear. Sclerae anicteric. No conjunctival injection.  CV: Regular, no murmur. Carotid pulses full and symmetric, no bruits. Distal pulses 2+ and symmetric.  Lungs: CTAB on anterior exam.  Abdomen: Soft, non-distended, non-tender. Bowel sounds present x4.  Extremities: No C/C/E. Neuro:  CN: Pupils are equal and round. They are symmetrically reactive from 3-->2 mm. EOMI without nystagmus. No reported diplopia. She has a tendency to keep her L eye closed but is able to open it normally with encouragement. Facial sensation is intact to light touch. Face is notable for mild flattening of the L nasolabial fold. She gives poor effort with strength testing. Hearing is decreased to conversational voice. Bilateral SCM and trapezii are at least 4/5, effort-dependent. Tongue is midline with normal bulk and mobility.  Motor: Normal bulk, tone. She has at least 4/5 strength throughout with no focal weakness and variable effort. No tremor or other abnormal movements. No drift.  Sensation: Intact to light touch.  DTRs: 2+ on the L, 3+ on the R. Toes downgoing bilaterally. No pathologic reflexes.  Coordination: Finger-to-nose is slow and deliberate bilaterally with no dysmetria--? Limited by vision.    Labs:  Lab Results  Component Value Date   WBC 4.0 08/28/2013   HGB 11.9 (L) 03/10/2016   HCT 35.0 (L) 03/10/2016   PLT 178 08/28/2013   GLUCOSE 94 03/10/2016   CHOL 151 07/27/2013   TRIG 44 07/27/2013   HDL 53 07/27/2013   LDLCALC 89 07/27/2013   ALT 12 08/28/2013   AST 18 08/28/2013    NA 142 03/10/2016   K 4.9 03/10/2016   CL 105 03/10/2016   CREATININE 1.00 03/10/2016   BUN 20 03/10/2016   CO2 20 08/28/2013   INR 1.09 07/26/2013   HGBA1C 5.9 (H) 08/27/2013   UA pending Urine drug screen pending Troponin 0.00 CMP pending PTT 27  Imaging:  I have personally and independently reviewed the [facility] without contrast from today. This shows mild diffuse atrophy and mild chronic small vessel disease in the bihemispheric white matter. No acute abnormality is appreciated.   Assessment and Plan:  1. Possible stroke: She presented with left hemiparesis and altered mental status which have improved  since her arrival in the ED. Main differential considerations in this case would include TIA, stroke, and seizure with Todd's paresis. Known risk factors for cerebrovascular disease in this patient include age and HTN. Additional workup will be ordered to include MRI brain, carotid Dopplers, TTE, fasting lipids, and hemoglobin a1c. I will also order EEG given that seizure is a possible consideration. Further testing will be determined by results from these initial studies. Recommend antiplatelet therapy with aspirin for secondary stroke prevention once cleared to take oral medications. Continue statin with goal LDL less than 70. Ensure adequate glucose control. Allow permissive hypertension in the acute phase, treating only SBP greater than 220 mmHg and/or DBP greater than 110 mmHg. Avoid fever and hyperglycemia as these can extend the infarct. Avoid hypotonic IVF to minimize exacerbation of post-stroke edema. Initiate rehab services. DVT prophylaxis as needed.   2. Acute encephalopathy: She had transient confusion this morning, described more as increased lethargy/unresponsiveness. As above, main considerations are cerebrovascular disease and seizure but need to exclude metabolic abnormalities. Labs thus far unrevealing but still waiting for CMP and UA. Her baseline dementia places her at  increased risk of encephalopathy from all causes. Follow up labs. Check MRI brain, EEG. Avoid CNS active meds, especially benzos, opiates, and anything with strong anticholinergic properties.   3. Left hemiparesis: This was acute but has now resolved. ? TIA/CVA vs Todd's paresis as above. Follow, PT/OT as needed.   4. Dementia: She has a history of dementia, ? Frontotemporal dementia based upon available information. This predisposes her to encephalopathy from all causes and can predict delayed recovery from same. Follow closely. Low-dose risperidone may be helpful for hallucinations or agitation if needed.   The patient would likely benefit from acute rehab services. Recommend consultation of PT/OT with consideration for PM&R consult as appropriate depending upon clinical progress and therapists' recommendations.   Fall risk: Risk factors for falls include age, need for assistance with ADLs at baseline, use of psychotropic/sedative/hypnotic medications, baseline cognitive dysfunction, female gender, and visual impairments. Strict fall precautions.  Limit psychoactive medications and sedating medications.  Delirium risk: Risk factors for delirium include history of dementia/cognitive impairment, age, co-morbid illness, impaired ADLs at baseline, sensory impairment (decreased vision, decreased hearing). Optimize metabolic status as needed. Treat any infection aggressively. Minimize the use of opiates, benzos or any medication with strong anticholinergic properties as much as possible. Optimize sleep-wake cycles as much as you can by keeping the room bright with activity during the day and dark and quiet at night.   Needs outpatient neurology follow-up?: To be determined  This was discussed with the patient's niece. Education was provided on the diagnosis and expected evaluation and treatment. She is in agreement with the plan as noted. She was given the opportunity to ask any questions and these were  addressed to her satisfaction.

## 2016-03-10 NOTE — ED Triage Notes (Signed)
[place] [facility] [place] [place] reporting increased lethargy this morning.  EMS reports no deficits reported by staff. L sided facial droop noted, pt don following all commands, hx dementia.  Resp e/u. Pt oriented to self.

## 2016-03-11 ENCOUNTER — Observation Stay (HOSPITAL_BASED_OUTPATIENT_CLINIC_OR_DEPARTMENT_OTHER): Payer: Medicare Other

## 2016-03-11 ENCOUNTER — Observation Stay (HOSPITAL_COMMUNITY): Payer: Medicare Other

## 2016-03-11 ENCOUNTER — Encounter (HOSPITAL_COMMUNITY): Payer: Self-pay | Admitting: General Practice

## 2016-03-11 DIAGNOSIS — R55 Syncope and collapse: Secondary | ICD-10-CM | POA: Diagnosis not present

## 2016-03-11 DIAGNOSIS — I10 Essential (primary) hypertension: Secondary | ICD-10-CM

## 2016-03-11 DIAGNOSIS — E785 Hyperlipidemia, unspecified: Secondary | ICD-10-CM | POA: Diagnosis not present

## 2016-03-11 DIAGNOSIS — G459 Transient cerebral ischemic attack, unspecified: Secondary | ICD-10-CM | POA: Diagnosis not present

## 2016-03-11 DIAGNOSIS — F0151 Vascular dementia with behavioral disturbance: Secondary | ICD-10-CM | POA: Diagnosis not present

## 2016-03-11 DIAGNOSIS — G9340 Encephalopathy, unspecified: Secondary | ICD-10-CM | POA: Diagnosis not present

## 2016-03-11 LAB — LIPID PANEL
CHOL/HDL RATIO: 3.2 ratio
Cholesterol: 190 mg/dL (ref 0–200)
HDL: 59 mg/dL (ref >40)
LDL CALC: 121 mg/dL — AB (ref 0–99)
Triglycerides: 48 mg/dL (ref <150)
VLDL: 10 mg/dL (ref 0–40)

## 2016-03-11 LAB — ECHOCARDIOGRAM COMPLETE
HEIGHTINCHES: 62.992 in
Weight: 2366.86 oz

## 2016-03-11 MED ORDER — PRAVASTATIN SODIUM 20 MG PO TABS: 20.0000 mg | ORAL_TABLET | Freq: Every day | ORAL | Status: DC

## 2016-03-11 MED ORDER — PRAVASTATIN SODIUM 20 MG PO TABS: 20.0000 mg | ORAL_TABLET | Freq: Every day | ORAL | 3 refills | Status: DC

## 2016-03-11 MED ORDER — IOPAMIDOL (ISOVUE-370) INJECTION 76%
INTRAVENOUS | Status: AC
  Administered 2016-03-11: 50 mL
  Filled 2016-03-11: qty 50

## 2016-03-11 MED ORDER — ASPIRIN 325 MG PO TABS: 325.0000 mg | ORAL_TABLET | Freq: Every day | ORAL | 1 refills | Status: AC

## 2016-03-11 MED ORDER — LISINOPRIL 20 MG PO TABS: 20.0000 mg | ORAL_TABLET | Freq: Every day | ORAL | 1 refills | Status: DC

## 2016-03-11 NOTE — Progress Notes (Signed)
Pt discharge education and instructions completed. Pt to be discharge back [facility] and report called off to nurse Burnice LoganYvette Chung. Will continue to monitor pt closely till picked up to facility. Dionne BucyP. Amo Miriam Liles RN

## 2016-03-11 NOTE — Care Management Note (Signed)
Case Management Note  Patient Details  Name: Marissa Chung MRN: [id number] Date of Birth: 04/26/1933  Subjective/Objective:                 [facility] per CSW. HH services [facility] clinical liaison Angela AdamDrew Wilkie. No further CM needs.    Action/Plan:  DC to ALF with HH.  Expected Discharge Date:  03/11/16               Expected Discharge Plan:  Home w Home Health Services (To [facility])  In-House Referral:  Clinical Social Work  Discharge planning Services  CM Consult  Post Acute Care Choice:  Home Health Choice offered to:     DME Arranged:    DME Agency:     HH Arranged:  RN, PT, OT, Nurse's Aide, Speech Therapy HH Agency:   [facility]  Status of Service:  Completed, signed off  If discussed at Long Length of Stay Meetings, dates discussed:    Additional Comments:  Lawerance SabalDebbie Armonie Staten, RN 03/11/2016, 2:27 PM

## 2016-03-11 NOTE — Evaluation (Deleted)
Physical Therapy Evaluation and Discharge Patient Details Name: Marissa Chung MRN: [id number] DOB: 1933/10/28 Today's Date: 03/11/2016   History of Present Illness  83 year old woman with history of dementia. She is admitted with increased altered mental status.  Clinical Impression  Patient evaluated by Physical Therapy with no further acute PT needs identified. All education has been completed and the patient has no further questions. Denies any falls in past 6 months. Has been managing well at home apparently and drives in the community. She does demonstrate some instability without an assistive device but reports she typically uses a cane or walker, especially outside of the home. Pt will benefit from outpatient PT follow-up for balance and strengthening. This was discussed with her today. See below for any follow-up Physical Therapy or equipment needs. PT is signing off. Thank you for this referral.     Follow Up Recommendations Outpatient PT    Equipment Recommendations  None recommended by PT    Recommendations for Other Services       Precautions / Restrictions Precautions Precautions: Fall Restrictions Weight Bearing Restrictions: No      Mobility  Bed Mobility Overal bed mobility: Modified Independent             General bed mobility comments: extra time  Transfers Overall transfer level: Modified independent Equipment used: None             General transfer comment: Slow to rise, reaching for furniture to stabilize but able to self correct.  Ambulation/Gait Ambulation/Gait assistance: Supervision Ambulation Distance (Feet): 200 Feet Assistive device: None Gait Pattern/deviations: Step-through pattern;Decreased stride length;Decreased stance time - left;Decreased weight shift to left;Antalgic;Staggering right;Drifts right/left;Wide base of support Gait velocity: slow Gait velocity interpretation: <1.8 ft/sec, indicative of risk for recurrent  falls General Gait Details: Mild instability noted but able to self correct. Supervision for safety. Slow turns but stable. Declined use of walker. HR to 102  Stairs Stairs: Yes Stairs assistance: Supervision Stair Management: One rail Right;Step to pattern;Forwards Number of Stairs: 13 General stair comments: Two hands on Rt rail, step to pattern, supervision for safety. No loss of balance.  Wheelchair Mobility    Modified Rankin (Stroke Patients Only) Modified Rankin (Stroke Patients Only) Pre-Morbid Rankin Score: No significant disability Modified Rankin: No significant disability     Balance Overall balance assessment: Needs assistance (Denies any falls past 6 months) Sitting-balance support: Feet supported Sitting balance-Leahy Scale: Normal     Standing balance support: No upper extremity supported Standing balance-Leahy Scale: Good                               Pertinent Vitals/Pain Pain Assessment: No/denies pain    Home Living Family/patient expects to be discharged to:: Private residence Living Arrangements: Alone Available Help at Discharge: Family;Available PRN/intermittently Type of Home: House Home Access: Stairs to enter Entrance Stairs-Rails: Right;Left;Can reach both Entrance Stairs-Number of Steps: 3 Home Layout: Two level;Able to live on main level with bedroom/bathroom;Laundry or work area in [employer] Equipment: Bedside commode;Walker - 2 wheels;Cane - single point (3in1)      Prior Function Level of Independence: Independent with assistive device(s)         Comments: drives, uses walker in community, cane occasionally, furniture walks in home     Hand Dominance   Dominant Hand: Right    Extremity/Trunk Assessment   Upper Extremity Assessment Upper Extremity Assessment: Defer to OT evaluation  Lower Extremity Assessment Lower Extremity Assessment: Generalized weakness       Communication   Communication: No  difficulties  Cognition Arousal/Alertness: Awake/alert Behavior During Therapy: WFL for tasks assessed/performed Overall Cognitive Status: Within Functional Limits for tasks assessed                      General Comments      Exercises     Assessment/Plan    PT Assessment Patent does not need any further PT services  PT Problem List         PT Treatment Interventions      PT Goals (Current goals can be found in the Care Plan section)  Acute Rehab PT Goals Patient Stated Goal: Go home soon PT Goal Formulation: All assessment and education complete, DC therapy    Frequency     Barriers to discharge        Co-evaluation               End of Session Equipment Utilized During Treatment: Gait belt Activity Tolerance: Patient tolerated treatment well Patient left: in bed;with call bell/phone within reach;with bed alarm set Nurse Communication: Mobility status PT Visit Diagnosis: Unsteadiness on feet (R26.81);Muscle weakness (generalized) (M62.81)    Functional Assessment Tool Used: Clinical judgement;AM-PAC 6 Clicks Basic Mobility Functional Limitation: Mobility: Walking and moving around Mobility: Walking and Moving Around Current Status ([id number](G8978): At least 1 percent but less than 20 percent impaired, limited or restricted Mobility: Walking and Moving Around Goal Status [phone](G8979): At least 1 percent but less than 20 percent impaired, limited or restricted Mobility: Walking and Moving Around Discharge Status [phone](G8980): At least 1 percent but less than 20 percent impaired, limited or restricted    Time: 0851-0910 PT Time Calculation (min) (ACUTE ONLY): 19 min   Charges:   PT Evaluation $PT Eval Low Complexity: 1 Procedure     PT G Codes:   PT G-Codes **NOT FOR INPATIENT CLASS** Functional Assessment Tool Used: Clinical judgement;AM-PAC 6 Clicks Basic Mobility Functional Limitation: Mobility: Walking and moving around Mobility: Walking and Moving Around  Current Status ([id number](G8978): At least 1 percent but less than 20 percent impaired, limited or restricted Mobility: Walking and Moving Around Goal Status [phone](G8979): At least 1 percent but less than 20 percent impaired, limited or restricted Mobility: Walking and Moving Around Discharge Status [phone](G8980): At least 1 percent but less than 20 percent impaired, limited or restricted     Berton MountLogan S Sueellen Kayes 03/11/2016, 9:19 AM Charlsie MerlesLogan Secor Anastacio Bua, PT

## 2016-03-11 NOTE — Progress Notes (Signed)
Pt IV and telemetry removed; pt picked up by niece to be transported off to disposition. Discharge packet handed off to pt's niece. Pt transported off unit via wheelchair with belongings and family to the side. Dionne BucyP. Amo Lakeshia Dohner RN

## 2016-03-11 NOTE — Progress Notes (Signed)
Handed pt off to Pricilla, Rn. Report given to her.

## 2016-03-11 NOTE — Discharge Summary (Addendum)
Physician Discharge Summary  Darielle Hancher MRN: [id number] DOB/AGE: 81-Aug-1935 81 y.o.  PCP: Lynne Logan, MD   Admit date: 03/10/2016 Discharge date: 03/11/2016  Discharge Diagnoses:    Principal Problem:   Syncope and collapse Active Problems:   Dementia   Anemia   Dyslipidemia   Acute encephalopathy   HTN (hypertension)    Follow-up recommendations Follow-up with PCP in 3-5 days , including all  additional recommended appointments as below Follow-up CBC, CMP in 3-5 days Please take fall precautions If SBP>160, patient can have an extra dose of 5 mg of lisinopril Check BP once a day PATIENT DID NOT QUALIFY FOR INPATIENT STAY/SNF PER social work, therefore she is being discharged to [facility] with home health  Echo results pending at the time of discharge      Current Discharge Medication List    START taking these medications   Details  aspirin 325 MG tablet Take 1 tablet (325 mg total) by mouth daily. Qty: 30 tablet, Refills: 1      CONTINUE these medications which have CHANGED   Details  lisinopril (PRINIVIL,ZESTRIL) 20 MG tablet Take 1 tablet (20 mg total) by mouth daily. Qty: 30 tablet, Refills: 1      CONTINUE these medications which have NOT CHANGED   Details  feeding supplement, ENSURE COMPLETE, (ENSURE COMPLETE) LIQD Take 237 mLs by mouth 2 (two) times daily between meals. Qty: 60 Bottle, Refills: 0    lactulose (CHRONULAC) 10 GM/15ML solution Take 20 g by mouth 2 (two) times daily.    memantine (NAMENDA) 5 MG tablet Take 5 mg by mouth 2 (two) times daily.    nystatin ointment (MYCOSTATIN) Apply 1 application topically 3 (three) times daily. To lower back    OLANZapine (ZYPREXA) 2.5 MG tablet Take 2.5 mg by mouth at bedtime.    risperiDONE (RISPERDAL) 0.25 MG tablet Take 1-2 tablets (0.25-0.5 mg total) by mouth 2 (two) times daily. .25 mg am and .50 mg at bedtime Qty: 60 tablet, Refills: 1    rivastigmine (EXELON) 9.5 mg/24hr Place 9.5 mg onto  the skin daily.         Discharge Condition: Stable but guarded  Discharge Instructions Get Medicines reviewed and adjusted: Please take all your medications with you for your next visit with your Primary MD  Please request your Primary MD to go over all hospital tests and procedure/radiological results at the follow up, please ask your Primary MD to get all Hospital records sent to his/her office.  If you experience worsening of your admission symptoms, develop shortness of breath, life threatening emergency, suicidal or homicidal thoughts you must seek medical attention immediately by calling 911 or calling your MD immediately if symptoms less severe.  You must read complete instructions/literature along with all the possible adverse reactions/side effects for all the Medicines you take and that have been prescribed to you. Take any new Medicines after you have completely understood and accpet all the possible adverse reactions/side effects.   Do not drive when taking Pain medications.   Do not take more than prescribed Pain, Sleep and Anxiety Medications  Special Instructions: If you have smoked or chewed Tobacco in the last 2 yrs please stop smoking, stop any regular Alcohol and or any Recreational drug use.  Wear Seat belts while driving.  Please note  You were cared for by a hospitalist during your hospital stay. Once you are discharged, your primary care physician will handle any further medical issues. Please note that NO REFILLS  for any discharge medications will be authorized once you are discharged, as it is imperative that you return to your primary care physician (or establish a relationship with a primary care physician if you do not have one) for your aftercare needs so that they can reassess your need for medications and monitor your lab values.     Allergies  Allergen Reactions  . Shellfish Allergy Hives      Disposition: 03-Skilled Nursing  Facility   Consults:  Neurology     Significant Diagnostic Studies:  Ct Angio Head W Or Wo Contrast  Result Date: 03/11/2016 CLINICAL DATA:  Initial evaluation for acute altered mental status, left facial droop. EXAM: CT ANGIOGRAPHY HEAD AND NECK TECHNIQUE: Multidetector CT imaging of the head and neck was performed using the standard protocol during bolus administration of intravenous contrast. Multiplanar CT image reconstructions and MIPs were obtained to evaluate the vascular anatomy. Carotid stenosis measurements (when applicable) are obtained utilizing NASCET criteria, using the distal internal carotid diameter as the denominator. CONTRAST:  50 cc of Isovue 370. COMPARISON:  Prior brain MRI from 03/10/2016. FINDINGS: CTA NECK FINDINGS Aortic arch: Visualized aortic arch of normal caliber with normal 3 vessel morphology. Scattered plaque within the arch itself and about the origin of the great vessels without flow-limiting stenosis. Visualized subclavian artery is widely patent. Right carotid system: Right common carotid artery patent from its origin to the bifurcation. Focal plaque at the proximal right ICA with short-segment stenosis of approximately 50% by NASCET criteria. Right ICA widely patent distally. Left carotid system: Left common carotid artery widely patent from its origin to the bifurcation. Mild atheromatous plaque about the proximal left ICA without flow-limiting stenosis. Left ICA patent distally to the skullbase without stenosis or other acute abnormality. Vertebral arteries: Both of the vertebral arteries arise from the subclavian arteries. Vertebral arteries patent within the neck without stenosis, dissection, or occlusion. Skeleton: No acute osseous abnormality. No worrisome lytic or blastic osseous lesions. Moderate degenerative spondylolysis present at C6-7. Other neck: Soft tissues of the neck demonstrate no acute abnormality. No adenopathy. Salivary glands normal. 13 mm  hypodense left thyroid nodule noted, of doubtful significance. Upper chest: Visualized mediastinum within normal limits. 11 mm right upper lobe nodule (series 6, image 180). Review of the MIP images confirms the above findings CTA HEAD FINDINGS Anterior circulation: Petrous segments patent bilaterally. Scattered atheromatous plaque throughout the carotid siphons with mild multifocal narrowing. ICA termini widely patent. A1 segments patent. Anterior communicating artery not well visualized. Anterior cerebral arteries demonstrate scattered atheromatous irregularity but are patent to their distal aspects. M1 segments patent without stenosis or occlusion. MCA bifurcations within normal limits. No proximal M2 occlusion. Distal MCA branches demonstrate atheromatous multifocal atheromatous regularity a, slightly worse on the right, but are fairly symmetric. Posterior circulation: Vertebral arteries patent to the vertebrobasilar junction. Left vertebral artery slightly dominant. Posterior inferior cerebral arteries patent bilaterally. Basilar artery widely patent. Superior cerebral arteries patent bilaterally. Both of the posterior cerebral are supplied via the basilar and are widely patent to their distal aspects. Mild distal small vessel atheromatous irregularity. Venous sinuses: Patent. Anatomic variants: No significant anatomic variant. No aneurysm or vascular malformation. Delayed phase: No pathologic enhancement. Review of the MIP images confirms the above findings IMPRESSION: 1. Negative CTA for large vessel occlusion. 2. 50% atheromatous stenosis at the proximal right ICA. 3. No significant atheromatous narrowing within the left carotid artery system. Widely patent vertebrobasilar system. 4. Distal small vessel atheromatous irregularity within the intracranial  circulation, most notable within the right MCA distribution. 5. **An incidental finding of potential clinical significance has been found. 11 mm right upper  lobe nodule, indeterminate Consider one of the following in 3 months for both low-risk and high-risk individuals: (a) repeat chest CT, (b) follow-up PET-CT, or (c) tissue sampling. This recommendation follows the consensus statement: Guidelines for Management of Incidental Pulmonary Nodules Detected on CT Images: From the Fleischner Society 2017; Radiology 2017; 284:228-243.** Electronically Signed   By: Jeannine Boga M.D.   On: 03/11/2016 06:19   Ct Angio Neck W Or Wo Contrast  Result Date: 03/11/2016 CLINICAL DATA:  Initial evaluation for acute altered mental status, left facial droop. EXAM: CT ANGIOGRAPHY HEAD AND NECK TECHNIQUE: Multidetector CT imaging of the head and neck was performed using the standard protocol during bolus administration of intravenous contrast. Multiplanar CT image reconstructions and MIPs were obtained to evaluate the vascular anatomy. Carotid stenosis measurements (when applicable) are obtained utilizing NASCET criteria, using the distal internal carotid diameter as the denominator. CONTRAST:  50 cc of Isovue 370. COMPARISON:  Prior brain MRI from 03/10/2016. FINDINGS: CTA NECK FINDINGS Aortic arch: Visualized aortic arch of normal caliber with normal 3 vessel morphology. Scattered plaque within the arch itself and about the origin of the great vessels without flow-limiting stenosis. Visualized subclavian artery is widely patent. Right carotid system: Right common carotid artery patent from its origin to the bifurcation. Focal plaque at the proximal right ICA with short-segment stenosis of approximately 50% by NASCET criteria. Right ICA widely patent distally. Left carotid system: Left common carotid artery widely patent from its origin to the bifurcation. Mild atheromatous plaque about the proximal left ICA without flow-limiting stenosis. Left ICA patent distally to the skullbase without stenosis or other acute abnormality. Vertebral arteries: Both of the vertebral arteries  arise from the subclavian arteries. Vertebral arteries patent within the neck without stenosis, dissection, or occlusion. Skeleton: No acute osseous abnormality. No worrisome lytic or blastic osseous lesions. Moderate degenerative spondylolysis present at C6-7. Other neck: Soft tissues of the neck demonstrate no acute abnormality. No adenopathy. Salivary glands normal. 13 mm hypodense left thyroid nodule noted, of doubtful significance. Upper chest: Visualized mediastinum within normal limits. 11 mm right upper lobe nodule (series 6, image 180). Review of the MIP images confirms the above findings CTA HEAD FINDINGS Anterior circulation: Petrous segments patent bilaterally. Scattered atheromatous plaque throughout the carotid siphons with mild multifocal narrowing. ICA termini widely patent. A1 segments patent. Anterior communicating artery not well visualized. Anterior cerebral arteries demonstrate scattered atheromatous irregularity but are patent to their distal aspects. M1 segments patent without stenosis or occlusion. MCA bifurcations within normal limits. No proximal M2 occlusion. Distal MCA branches demonstrate atheromatous multifocal atheromatous regularity a, slightly worse on the right, but are fairly symmetric. Posterior circulation: Vertebral arteries patent to the vertebrobasilar junction. Left vertebral artery slightly dominant. Posterior inferior cerebral arteries patent bilaterally. Basilar artery widely patent. Superior cerebral arteries patent bilaterally. Both of the posterior cerebral are supplied via the basilar and are widely patent to their distal aspects. Mild distal small vessel atheromatous irregularity. Venous sinuses: Patent. Anatomic variants: No significant anatomic variant. No aneurysm or vascular malformation. Delayed phase: No pathologic enhancement. Review of the MIP images confirms the above findings IMPRESSION: 1. Negative CTA for large vessel occlusion. 2. 50% atheromatous  stenosis at the proximal right ICA. 3. No significant atheromatous narrowing within the left carotid artery system. Widely patent vertebrobasilar system. 4. Distal small vessel atheromatous irregularity within  the intracranial circulation, most notable within the right MCA distribution. 5. **An incidental finding of potential clinical significance has been found. 11 mm right upper lobe nodule, indeterminate Consider one of the following in 3 months for both low-risk and high-risk individuals: (a) repeat chest CT, (b) follow-up PET-CT, or (c) tissue sampling. This recommendation follows the consensus statement: Guidelines for Management of Incidental Pulmonary Nodules Detected on CT Images: From the Fleischner Society 2017; Radiology 2017; 284:228-243.** Electronically Signed   By: Jeannine Boga M.D.   On: 03/11/2016 06:19   Mr Brain Wo Contrast  Result Date: 03/10/2016 CLINICAL DATA:  Confusion and altered mental status, last seen well at 9 a.m. Acute LEFT hemi paresis. History of hypertension, frontal lobe dementia, dyslipidemia. EXAM: MRI HEAD WITHOUT CONTRAST TECHNIQUE: Multiplanar, multiecho pulse sequences of the brain and surrounding structures were obtained without intravenous contrast. COMPARISON:  CT HEAD March 10, 2016 at 1136 hours and MRI head July 27, 2013 FINDINGS: Multiple sequences are mildly or moderately motion degraded. BRAIN: No reduced diffusion to suggest acute ischemia. No susceptibility artifact to suggest hemorrhage though axial MPGR is moderately motion degraded. The ventricles and sulci are normal for patient's age. Patchy to confluent supratentorial white matter FLAIR T2 hyperintensities, increased from prior MRI head. No suspicious parenchymal signal, masses or mass effect. No abnormal extra-axial fluid collections. Cerebellar tonsils descend 2-3 mm below the foramen magnum, are not pointed and do not reach criteria for Chiari 1 malformation. VASCULAR: Normal major intracranial  vascular flow voids present at skull base. SKULL AND UPPER CERVICAL SPINE: No abnormal sellar expansion. No suspicious calvarial bone marrow signal. Craniocervical junction maintained. SINUSES/ORBITS: The mastoid air-cells and included paranasal sinuses are well-aerated. The included ocular globes and orbital contents are non-suspicious. OTHER: None. IMPRESSION: 1. No acute intracranial process on this motion degraded examination. 2. Increased moderate chronic small vessel ischemic disease from 2015. Electronically Signed   By: Elon Alas M.D.   On: 03/10/2016 16:10   Ct Head Code Stroke Wo Contrast`  Result Date: 03/10/2016 CLINICAL DATA:  Code stroke. Unresponsive episode. Left-sided weakness and left facial droop. EXAM: CT HEAD WITHOUT CONTRAST TECHNIQUE: Contiguous axial images were obtained from the base of the skull through the vertex without intravenous contrast. COMPARISON:  08/27/2013 FINDINGS: Brain: No evidence of acute infarction, hemorrhage, hydrocephalus, extra-axial collection or mass lesion/mass effect. Mild microvascular ischemic change in the cerebral white matter. Normal brain volume for age. Vascular: Atherosclerotic calcification.  No hyperdense vessel. Skull: Negative. Sinuses/Orbits: Negative Other: Text page with results sent 03/10/2016 at 11:43 am to Dr. Shon Hale. ASPECTS [facility] Stroke Program Early CT Score) - Ganglionic level infarction (caudate, lentiform nuclei, internal capsule, insula, M1-M3 cortex): 7 - Supraganglionic infarction (M4-M6 cortex): 3 Total score (0-10 with 10 being normal): 10 IMPRESSION: No acute finding or change from prior. ASPECTS is 10. Electronically Signed   By: Monte Fantasia M.D.   On: 03/10/2016 11:46       Filed Weights   03/10/16 1112 03/10/16 1113  Weight: 70.3 kg (155 lb) 67.1 kg (147 lb 14.9 oz)     Microbiology: No results found for this or any previous visit (from the past 240 hour(s)).     Blood Culture    Component Value  Date/Time   SDES URINE, CLEAN CATCH 07/26/2013 1624   SPECREQUEST Normal 07/26/2013 1624   CULT  07/26/2013 1624    Multiple bacterial morphotypes present, none predominant. Suggest appropriate recollection if clinically indicated. Performed at [facility]  REPTSTATUS 07/27/2013 FINAL 07/26/2013 1624      Labs: Results for orders placed or performed during the hospital encounter of 03/10/16 (from the past 48 hour(s))  Urinalysis, Routine w reflex microscopic     Status: Abnormal   Collection Time: 03/10/16 11:30 AM  Result Value Ref Range   Color, Urine YELLOW YELLOW   APPearance CLEAR CLEAR   Specific Gravity, Urine 1.009 1.005 - 1.030   pH 6.0 5.0 - 8.0   Glucose, UA NEGATIVE NEGATIVE mg/dL   Hgb urine dipstick NEGATIVE NEGATIVE   Bilirubin Urine NEGATIVE NEGATIVE   Ketones, ur NEGATIVE NEGATIVE mg/dL   Protein, ur NEGATIVE NEGATIVE mg/dL   Nitrite NEGATIVE NEGATIVE   Leukocytes, UA MODERATE (A) NEGATIVE   RBC / HPF 0-5 0 - 5 RBC/hpf   WBC, UA 0-5 0 - 5 WBC/hpf   Bacteria, UA RARE (A) NONE SEEN   Squamous Epithelial / LPF 0-5 (A) NONE SEEN   Mucous PRESENT   Protime-INR     Status: None   Collection Time: 03/10/16 11:46 AM  Result Value Ref Range   Prothrombin Time 14.0 11.4 - 15.2 seconds   INR 1.07   APTT     Status: None   Collection Time: 03/10/16 11:46 AM  Result Value Ref Range   aPTT 27 24 - 36 seconds  CBC     Status: Abnormal   Collection Time: 03/10/16 11:46 AM  Result Value Ref Range   WBC 4.9 4.0 - 10.5 K/uL   RBC 3.88 3.87 - 5.11 MIL/uL   Hemoglobin 10.7 (L) 12.0 - 15.0 g/dL   HCT 33.6 (L) 36.0 - 46.0 %   MCV 86.6 78.0 - 100.0 fL   MCH 27.6 26.0 - 34.0 pg   MCHC 31.8 30.0 - 36.0 g/dL   RDW 14.8 11.5 - 15.5 %   Platelets 169 150 - 400 K/uL  Differential     Status: None   Collection Time: 03/10/16 11:46 AM  Result Value Ref Range   Neutrophils Relative % 61 %   Neutro Abs 3.0 1.7 - 7.7 K/uL   Lymphocytes Relative 25 %   Lymphs Abs 1.2  0.7 - 4.0 K/uL   Monocytes Relative 13 %   Monocytes Absolute 0.7 0.1 - 1.0 K/uL   Eosinophils Relative 1 %   Eosinophils Absolute 0.1 0.0 - 0.7 K/uL   Basophils Relative 0 %   Basophils Absolute 0.0 0.0 - 0.1 K/uL  Comprehensive metabolic panel     Status: Abnormal   Collection Time: 03/10/16 11:46 AM  Result Value Ref Range   Sodium 139 135 - 145 mmol/L   Potassium 4.7 3.5 - 5.1 mmol/L   Chloride 106 101 - 111 mmol/L   CO2 28 22 - 32 mmol/L   Glucose, Bld 100 (H) 65 - 99 mg/dL   BUN 16 6 - 20 mg/dL   Creatinine, Ser 0.82 0.44 - 1.00 mg/dL   Calcium 9.3 8.9 - 10.3 mg/dL   Total Protein 7.1 6.5 - 8.1 g/dL   Albumin 3.3 (L) 3.5 - 5.0 g/dL   AST 65 (H) 15 - 41 U/L   ALT 52 14 - 54 U/L   Alkaline Phosphatase 64 38 - 126 U/L   Total Bilirubin 0.3 0.3 - 1.2 mg/dL   GFR calc non Af Amer >60 >60 mL/min   GFR calc Af Amer >60 >60 mL/min    Comment: (NOTE) The eGFR has been calculated using the CKD EPI equation. This calculation has not  been validated in all clinical situations. eGFR's persistently <60 mL/min signify possible Chronic Kidney Disease.    Anion gap 5 5 - 15  Ethanol     Status: None   Collection Time: 03/10/16 11:46 AM  Result Value Ref Range   Alcohol, Ethyl (B) <5 <5 mg/dL    Comment:        LOWEST DETECTABLE LIMIT FOR SERUM ALCOHOL IS 5 mg/dL FOR MEDICAL PURPOSES ONLY   CBG monitoring, ED     Status: None   Collection Time: 03/10/16 11:50 AM  Result Value Ref Range   Glucose-Capillary 90 65 - 99 mg/dL   Comment 1 Notify RN    Comment 2 Document in Chart   I-stat troponin, ED     Status: None   Collection Time: 03/10/16 11:55 AM  Result Value Ref Range   Troponin i, poc 0.00 0.00 - 0.08 ng/mL   Comment 3            Comment: Due to the release kinetics of cTnI, a negative result within the first hours of the onset of symptoms does not rule out myocardial infarction with certainty. If myocardial infarction is still suspected, repeat the test at  appropriate intervals.   I-Stat Chem 8, ED     Status: Abnormal   Collection Time: 03/10/16 11:57 AM  Result Value Ref Range   Sodium 142 135 - 145 mmol/L   Potassium 4.9 3.5 - 5.1 mmol/L   Chloride 105 101 - 111 mmol/L   BUN 20 6 - 20 mg/dL   Creatinine, Ser 1.00 0.44 - 1.00 mg/dL   Glucose, Bld 94 65 - 99 mg/dL   Calcium, Ion 1.18 1.15 - 1.40 mmol/L   TCO2 31 0 - 100 mmol/L   Hemoglobin 11.9 (L) 12.0 - 15.0 g/dL   HCT 35.0 (L) 36.0 - 46.0 %  Rapid urine drug screen (hospital performed)     Status: None   Collection Time: 03/10/16 12:12 PM  Result Value Ref Range   Opiates NONE DETECTED NONE DETECTED   Cocaine NONE DETECTED NONE DETECTED   Benzodiazepines NONE DETECTED NONE DETECTED   Amphetamines NONE DETECTED NONE DETECTED   Tetrahydrocannabinol NONE DETECTED NONE DETECTED   Barbiturates NONE DETECTED NONE DETECTED    Comment:        DRUG SCREEN FOR MEDICAL PURPOSES ONLY.  IF CONFIRMATION IS NEEDED FOR ANY PURPOSE, NOTIFY LAB WITHIN 5 DAYS.        LOWEST DETECTABLE LIMITS FOR URINE DRUG SCREEN Drug Class       Cutoff (ng/mL) Amphetamine      1000 Barbiturate      200 Benzodiazepine   702 Tricyclics       637 Opiates          300 Cocaine          300 THC              50   CBG monitoring, ED     Status: None   Collection Time: 03/10/16  1:04 PM  Result Value Ref Range   Glucose-Capillary 86 65 - 99 mg/dL   Comment 1 Notify RN    Comment 2 Document in Chart   Vitamin B12     Status: None   Collection Time: 03/10/16  4:48 PM  Result Value Ref Range   Vitamin B-12 551 180 - 914 pg/mL    Comment: (NOTE) This assay is not validated for testing neonatal or myeloproliferative syndrome specimens for Vitamin  B12 levels.   Folate     Status: None   Collection Time: 03/10/16  4:48 PM  Result Value Ref Range   Folate 13.8 >5.9 ng/mL  Iron and TIBC     Status: None   Collection Time: 03/10/16  4:48 PM  Result Value Ref Range   Iron 84 28 - 170 ug/dL   TIBC 314 250 -  450 ug/dL   Saturation Ratios 27 10.4 - 31.8 %   UIBC 230 ug/dL  Ferritin     Status: None   Collection Time: 03/10/16  4:48 PM  Result Value Ref Range   Ferritin 139 11 - 307 ng/mL  Reticulocytes     Status: None   Collection Time: 03/10/16  4:48 PM  Result Value Ref Range   Retic Ct Pct 1.0 0.4 - 3.1 %   RBC. 3.93 3.87 - 5.11 MIL/uL   Retic Count, Manual 39.3 19.0 - 186.0 K/uL  Lipid panel     Status: Abnormal   Collection Time: 03/11/16  6:12 AM  Result Value Ref Range   Cholesterol 190 0 - 200 mg/dL   Triglycerides 48 <150 mg/dL   HDL 59 >40 mg/dL   Total CHOL/HDL Ratio 3.2 RATIO   VLDL 10 0 - 40 mg/dL   LDL Cholesterol 121 (H) 0 - 99 mg/dL    Comment:        Total Cholesterol/HDL:CHD Risk Coronary Heart Disease Risk Table                     Men   Women  1/2 Average Risk   3.4   3.3  Average Risk       5.0   4.4  2 X Average Risk   9.6   7.1  3 X Average Risk  23.4   11.0        Use the calculated Patient Ratio above and the CHD Risk Table to determine the patient's CHD Risk.        ATP III CLASSIFICATION (LDL):  <100     mg/dL   Optimal  100-129  mg/dL   Near or Above                    Optimal  130-159  mg/dL   Borderline  160-189  mg/dL   High  >190     mg/dL   Very High      Lipid Panel     Component Value Date/Time   CHOL 190 03/11/2016 0612   TRIG 48 03/11/2016 0612   HDL 59 03/11/2016 0612   CHOLHDL 3.2 03/11/2016 0612   VLDL 10 03/11/2016 0612   LDLCALC 121 (H) 03/11/2016 0612     Lab Results  Component Value Date   HGBA1C 5.9 (H) 08/27/2013   HGBA1C 6.1 (H) 07/27/2013      HPI :*   83 y.o.femalewith medical history significant of Dementia, HTN, dyslipidemia who presented to the ED via EMS with confusion and AMS. Patient brought in from [facility], via [facility]  , due to an unresponsive episode which was witnessed by the nursing home staff. Patient was found to have a left facial droop. Unresponsive for several minutes. Patient  does not recall the events of the day. Noted to be in normal sinus rhythm. Speech is intact. No gross focal neurologic deficits other than the facial droop. CT head without acute changes. MRI of the brain pending. Neurology recommends admission for stroke workup.  She was sitting in the dining room at her nursing facility this morning when she was noted to be more lethargic than usual. Per ED RN note, she was at breakfast at [facility] when she was "aphasic...drooling from L side of mouth, 'did a couple of jerking movements, like twitching with her arm' [left arm]." EMS was activated and states that SNF staff did not report any focal deficit. However, in the ED, she was noted to have a left facial droop, left leg drift, and confusion. CODE STROKE was activated and she was taken for an emergent CTH which showed no acute abnormality. Her deficits largely resolved in the ED and on my exam she has a subtle L facial droop but little else.   Her niece at the bedside reports that she has a history of dementia. She has poor short-term memory and has frequent visual hallucinations. Her niece reports that she has good days where she is alert and interactive with intermixed bad days where she is lethargic and does very little. She is independent with basic ADLs. She reports that the patient is hard of hearing and has cataracts which affect her vision.   HOSPITAL COURSE:    Syncope - unclear etiology - possible stroke / TIA vs Seizure MRI of the brain no acute process CTA head and neck 50% proximal right ICA stenosis EEG nonspecific and indicates global cerebral dysfunction. This can be seen with neurodegenerative disorders, hypoxic ischemic brain injury, metabolic derangements, and medication effect. In this case, she has a known history of dementia which may be underlying her slowing 2-D echo pending LDL 121 Patient started on aspirin 325 for possible TIA PT OT evaluation  Acute encephalopathy - patient had  increased sleepiness and some confusion earlier this am in the facility, UA negative, UDS negative Most likely associated with dementia and underlying age related CVD    Anemia - hemoglobin 11.9, anemia panel essentially normal normal vitamin B-12 and folate Continue to monitor Hgb and Hct  Dementia - suspected frontotemporal Provide supportive care and monitor for safety  Dyslipidemia Continue statin   Discharge Exam:  Blood pressure (!) 166/71, pulse 77, temperature 98.4 F (36.9 C), temperature source Oral, resp. rate 16, height 5' 2.99" (1.6 m), weight 67.1 kg (147 lb 14.9 oz), SpO2 100 %.   Cardiovascular: RRR, no m/r/g. No JVD, carotid bruits. No LE edema.   Respiratory: bilateral no wheezes, rales, rhonchi or cracles. Normal respiratory effort. No accessory muscle use observed  Abdomen: soft, non-tender, non-distended, no organomegaly or masses appreciated. BS present in all quadrants  Skin: no rash, ulcers or induration seen on limited exam  Musculoskeletal: grossly normal tone BUE/BLE, good ROM, no bony abnormality or joint deformities observed  Psychiatric: grossly normal mood and affect, speech fluent and appropriate, alert and oriented x3  Neurologic: CN II-XII grossly intact, moves all extremities in coordinated fashion, sensation intact    Follow-up Information    Lynne Logan, MD. Call.   Specialty:  Family Medicine Why:  Hospital follow-up Contact information: [place] [address] [phone]           Signed: Reyne Dumas 03/11/2016, 9:00 AM        Time spent >45 mins

## 2016-03-11 NOTE — Progress Notes (Signed)
OT Cancellation Note  Patient Details Name: Marissa Chung MRN: [id number] DOB: 1933/12/28   Cancelled Treatment:    Reason Eval/Treat Not Completed: Patient at procedure or test/ unavailable. Will follow up for OT eval as time allows.  Gaye AlkenBailey A [name] M.S., OTR/L Pager: [phone]  03/11/2016, 11:07 AM

## 2016-03-11 NOTE — Progress Notes (Signed)
Pt 0000 SBP was below 100 after sitting pt up new reading 115/62 will notify attending.

## 2016-03-11 NOTE — Progress Notes (Addendum)
STROKE TEAM PROGRESS NOTE   HISTORY OF PRESENT ILLNESS (per record) This is an 83-yo RH woman with h/o dementia who now presents from her nursing facility for evaluation of altered mental status. History is obtained from ED staff, chart, and the patient's niece who is present at the bedside.   She was sitting in the dining room at her nursing facility this morning when she was noted to be more lethargic than usual. Per ED RN note, [facility] when she was "aphasic...drooling from L side of mouth, 'did a couple of jerking movements, like twitching with her arm' [left arm]." EMS was activated and states that SNF staff did not report any focal deficit. However, in the ED, she was noted to have a left facial droop, left leg drift, and confusion. CODE STROKE was activated and she was taken for an emergent CTH which showed no acute abnormality. Her deficits largely resolved in the ED and on my exam she has a subtle L facial droop but little else.   Her niece at the bedside reports that she has a history of dementia. She has poor short-term memory and has frequent visual hallucinations. Her niece reports that she has good days where she is alert and interactive with intermixed bad days where she is lethargic and does very little. She is independent with basic ADLs. She reports that the patient is hard of hearing and has cataracts which affect her vision.   Last known well: 0900 NHISS score: 1 on my exam (mild facial droop) tPA given?: No, symptoms improved   SUBJECTIVE (INTERVAL HISTORY) Her niece is at the bedside.  Niece stated that pt is at her baseline. She is not sure what happened in SNF yesterday but when she saw her, she was at her baseline. And she stated that her medication in SNF sometimes makes her lethargic. MRI negative for stroke.    OBJECTIVE Temp:  [97.3 F (36.3 C)-98.8 F (37.1 C)] 98.4 F (36.9 C) (03/10 0605) Pulse Rate:  [57-80] 77 (03/10 0605) Cardiac  Rhythm: Normal sinus rhythm (03/09 1900) Resp:  [12-19] 16 (03/10 0605) BP: (92-199)/(55-96) 166/71 (03/10 0605) SpO2:  [98 %-100 %] 100 % (03/10 0605) Weight:  [67.1 kg (147 lb 14.9 oz)-70.3 kg (155 lb)] 67.1 kg (147 lb 14.9 oz) (03/09 1113)  CBC:  Recent Labs Lab 03/10/16 1146 03/10/16 1157  WBC 4.9  --   NEUTROABS 3.0  --   HGB 10.7* 11.9*  HCT 33.6* 35.0*  MCV 86.6  --   PLT 169  --     Basic Metabolic Panel:  Recent Labs Lab 03/10/16 1146 03/10/16 1157  NA 139 142  K 4.7 4.9  CL 106 105  CO2 28  --   GLUCOSE 100* 94  BUN 16 20  CREATININE 0.82 1.00  CALCIUM 9.3  --     Lipid Panel:    Component Value Date/Time   CHOL 151 07/27/2013 0351   TRIG 44 07/27/2013 0351   HDL 53 07/27/2013 0351   CHOLHDL 2.8 07/27/2013 0351   VLDL 9 07/27/2013 0351   LDLCALC 89 07/27/2013 0351   HgbA1c:  Lab Results  Component Value Date   HGBA1C 5.9 (H) 08/27/2013   Urine Drug Screen:    Component Value Date/Time   LABOPIA NONE DETECTED 03/10/2016 1212   COCAINSCRNUR NONE DETECTED 03/10/2016 1212   LABBENZ NONE DETECTED 03/10/2016 1212   AMPHETMU NONE DETECTED 03/10/2016 1212   THCU NONE DETECTED 03/10/2016 1212  LABBARB NONE DETECTED 03/10/2016 1212      IMAGING I have personally reviewed the radiological images below and agree with the radiology interpretations.  Ct Angio Head and Neck W Or Wo Contrast 03/11/2016 1. Negative CTA for large vessel occlusion.  2. 50% atheromatous stenosis at the proximal right ICA.  3. No significant atheromatous narrowing within the left carotid artery system. Widely patent vertebrobasilar system.  4. Distal small vessel atheromatous irregularity within the intracranial circulation, most notable within the right MCA distribution. 5. An incidental finding of potential clinical significance has been found. 11 mm right upper lobe nodule, indeterminate Consider one of the following in 3 months for both low-risk and high-risk individuals:  (a) repeat chest CT, (b) follow-up PET-CT, or (c) tissue sampling. This recommendation follows the consensus statement: Guidelines for Management of Incidental Pulmonary Nodules Detected on CT Images: From the Fleischner Society 2017; Radiology 2017; 284:228-243.   Mr Brain Wo Contrast 03/10/2016 1. No acute intracranial process on this motion degraded examination.  2. Increased moderate chronic small vessel ischemic disease from 2015.   Ct Head Code Stroke Wo Contrast` 03/10/2016 No acute finding or change from prior. ASPECTS is 10.    TTE - Left ventricle: The cavity size was normal. Wall thickness was   increased in a pattern of mild LVH. Systolic function was normal.   The estimated ejection fraction was in the range of 60% to 65%.   Wall motion was normal; there were no regional wall motion   abnormalities. Doppler parameters are consistent with abnormal   left ventricular relaxation (grade 1 diastolic dysfunction). The   E/e&' ratio is between 8-15, suggesting indeterminate LV filling   pressure. - Aortic valve: Mildly calcified leaflets. There was no stenosis.   There was no significant regurgitation. - Left atrium: The atrium was normal in size. - Inferior vena cava: The vessel was normal in size. The   respirophasic diameter changes were in the normal range (>= 50%),   consistent with normal central venous pressure. Impressions: - Compared to a prior echo in 2015, there has been no significant   change.  EEG - This is an abnormal EEG due to moderate diffuse generalized slowing. No evidence of epileptiform activity or seizure. Clinical correlation: This pattern is nonspecific and indicates global cerebral dysfunction. This can be seen with neurodegenerative disorders, hypoxic ischemic brain injury, metabolic derangements, and medication effect. In this case, she has a known history of dementia which may be underlying her slowing.   PHYSICAL EXAM  Temp:  [97.3 F (36.3 C)-98.5  F (36.9 C)] 98.4 F (36.9 C) (03/10 1346) Pulse Rate:  [61-77] 73 (03/10 1346) Resp:  [13-18] 18 (03/10 1346) BP: (92-184)/(55-71) 144/61 (03/10 1346) SpO2:  [94 %-100 %] 99 % (03/10 1346)  General - Well nourished, well developed, in no apparent distress.  Ophthalmologic - Fundi not visualized due to noncooperation.  Cardiovascular - Regular rate and rhythm.  Mental Status -  Level of arousal and orientation to age, month, place, and person were intact, not orientated to year or name of the hospital. Language including expression, naming, repetition, comprehension was assessed and found intact.  Cranial Nerves II - XII - II - Visual field intact OU. III, IV, VI - Extraocular movements intact. V - Facial sensation intact bilaterally. VII - left nasolabial fold flattening, chronic. VIII - Hearing & vestibular intact bilaterally. X - Palate elevates symmetrically. XI - Chin turning & shoulder shrug intact bilaterally. XII - Tongue protrusion intact.  Motor Strength - The patient's strength was normal in all extremities and pronator drift was absent.  Bulk was normal and fasciculations were absent.   Motor Tone - Muscle tone was assessed at the neck and appendages and was normal.  Reflexes - The patient's reflexes were 1+ in all extremities and she had no pathological reflexes.  Sensory - Light touch, temperature/pinprick were assessed and were symmetrical.    Coordination - The patient had normal movements in the hands with no ataxia or dysmetria.  Tremor was absent.  Gait and Station - deferred.   ASSESSMENT/PLAN Ms. Marissa Chung is a 83 y.o. female with history of hypertension, dementia, bilateral cataracts, and hearing impaired presenting with altered mental status, aphasia, and a subtle left facial droop. She did not receive IV t-PA due to minimal deficits.  Encephalopathy vs. Medication effect in SNF vs. TIA  Resultant  Back to baseline  MRI - No acute  intracranial process.  CTA H&N - Negative CTA for large vessel occlusion. Right ICA 50% stenosis with b/l siphon athero  2D Echo - EF 60-65%  LDL - 121  HgbA1c - pending  VTE prophylaxis - SCDs  Diet Heart Room service appropriate? Yes; Fluid consistency: Thin  aspirin 81 mg daily prior to admission, now on aspirin 325 mg daily. Continue ASA 325mg  on discharge.  Patient counseled to be compliant with her antithrombotic medications  Ongoing aggressive stroke risk factor management  Therapy recommendations: SNF recommended  Disposition: Pending  Dementia  On exelon patch and namenda  risperdal for behavior disturbance.  Continue as outpt  Hypertension  Labile blood pressures  Permissive hypertension (OK if < 220/120) but gradually normalize in 5-7 days  Long-term BP goal normotensive  Hyperlipidemia  Home meds:  No lipid lowering medications prior to admission  LDL 121, goal < 70  Add pravastatin low dose  Continue statin at discharge  Other Stroke Risk Factors  Advanced age  Former smoker  Other Active Problems  Pulmonary nodule - further follow-up recommended  Mild anemia - 11.9/ 35    Hospital day # 0  Neurology will sign off. Please call with questions. No neuro follow up needed at this time. Thanks for the consult.  Marvel Plan, MD PhD Stroke Neurology 03/11/2016 6:27 PM   To contact Stroke Continuity provider, please refer to [email or web address]. After hours, contact General Neurology

## 2016-03-11 NOTE — Progress Notes (Signed)
Clinical Social Worker facilitated patient discharge including contacting patient family and facility to confirm patient discharge plans.  Clinical information faxed to facility and family agreeable with plan. CSW spoke to patients niece Moises Blood(Wanda Kuba Shepherd [phone]) and she stated she will [facility] around 4[facility]. RN Gershon Cullriscilla to call [phone] Lind Guest(Yvette Thomas) for report prior to discharge.  Clinical Social Worker will sign off for now as social work intervention is no longer needed. Please consult us again if new need arises.  Marrianne MoodAshley Elbert Spickler, MSW, [employer] [phone]

## 2016-03-11 NOTE — Evaluation (Signed)
Physical Therapy Evaluation Patient Details Name: Marissa Chung MRN: [id number] DOB: [birth year] Today's Date: 03/11/2016   History of Present Illness  83 y.o. female with medical history significant of Dementia, HTN, dyslipidemia who presented to the ED via EMS with confusion and AMS.. Admitted for syncope and collapse.  Clinical Impression  Pt admitted with above complications. Pt currently with functional limitations due to the deficits listed below (see PT Problem List). She is from assisted living, but due to her loss of balance and confusion, I recommend she receive short term SNF to improve her safety and independence with mobility. She requires verbal and physical cues to sequence with a walker, and min assist to prevent falls to left and right. Pt will benefit from skilled PT to increase their independence and safety with mobility to allow discharge to the venue listed below.       Follow Up Recommendations SNF (Short-term most likely, due to high fall risk, confusion, and need for physical assist with ambulation)    Equipment Recommendations  None recommended by PT    Recommendations for Other Services OT consult     Precautions / Restrictions Precautions Precautions: Fall Restrictions Weight Bearing Restrictions: No      Mobility  Bed Mobility Overal bed mobility: Needs Assistance Bed Mobility: Supine to Sit     Supine to sit: Supervision     General bed mobility comments: Required verbal cues to initiate intermittently. No physical assist.  Transfers Overall transfer level: Needs assistance Equipment used: Rolling walker (2 wheeled) Transfers: Sit to/from Stand Sit to Stand: Min assist         General transfer comment: Min assist first time, progressed to close guard assist for safety. Cues for technique and hand placement.  Ambulation/Gait Ambulation/Gait assistance: Min assist Ambulation Distance (Feet): 90 Feet Assistive device: Rolling walker (2  wheeled) Gait Pattern/deviations: Step-through pattern;Decreased stride length;Scissoring;Shuffle;Staggering right;Staggering left;Drifts right/left;Narrow base of support Gait velocity: slow Gait velocity interpretation: <1.8 ft/sec, indicative of risk for recurrent falls General Gait Details: Intermittently required min assist for loss of balance towards left and right. Assist for walker control and cues for sequencing. Scissoring of gait at time. No buckling noted. Difficulty with turns requiring verbal and tactile cues.  Stairs            Wheelchair Mobility    Modified Rankin (Stroke Patients Only) Modified Rankin (Stroke Patients Only) Pre-Morbid Rankin Score: Moderately severe disability Modified Rankin: Moderately severe disability     Balance Overall balance assessment: Needs assistance Sitting-balance support: No upper extremity supported;Feet supported Sitting balance-Leahy Scale: Good     Standing balance support: No upper extremity supported Standing balance-Leahy Scale: Fair                               Pertinent Vitals/Pain Pain Assessment: No/denies pain    Home Living Family/patient expects to be discharged to:: Unsure Chip Boer) Living Arrangements: Alone Available Help at Discharge: Other (Comment) (Unsure) Type of Home: Assisted living (vs. Ind living)         Home Equipment: Walker - 4 wheels      Prior Function Level of Independence: Needs assistance   Gait / Transfers Assistance Needed: States she uses a rollator for mobility - questionable hx  ADL's / Homemaking Assistance Needed: Pt from ALF vs Ind living. She is a poor historian and familiy not immediately available to confirm history.        Hand  Dominance   Dominant Hand:  (Unsure)    Extremity/Trunk Assessment   Upper Extremity Assessment Upper Extremity Assessment: Defer to OT evaluation    Lower Extremity Assessment Lower Extremity Assessment: Generalized  weakness    Cervical / Trunk Assessment Cervical / Trunk Assessment: Normal  Communication   Communication: HOH  Cognition Arousal/Alertness: Lethargic Behavior During Therapy: Flat affect Overall Cognitive Status: No family/caregiver present to determine baseline cognitive functioning Area of Impairment: Orientation;Following commands;Safety/judgement;Problem solving Orientation Level: Disoriented to;Place;Time;Situation;Person (name only)     Following Commands: Follows one step commands inconsistently;Follows one step commands with increased time Safety/Judgement: Decreased awareness of safety;Decreased awareness of deficits   Problem Solving: Slow processing;Decreased initiation;Difficulty sequencing;Requires verbal cues;Requires tactile cues      General Comments      Exercises     Assessment/Plan    PT Assessment Patient needs continued PT services  PT Problem List Decreased strength;Decreased activity tolerance;Decreased balance;Decreased mobility;Decreased coordination;Decreased knowledge of use of DME;Decreased safety awareness;Decreased knowledge of precautions       PT Treatment Interventions DME instruction;Gait training;Functional mobility training;Therapeutic activities;Therapeutic exercise;Balance training;Neuromuscular re-education;Patient/family education    PT Goals (Current goals can be found in the Care Plan section)  Acute Rehab PT Goals Patient Stated Goal: none stated PT Goal Formulation: Patient unable to participate in goal setting Time For Goal Achievement: 03/25/16 Potential to Achieve Goals: Good    Frequency Min 3X/week   Barriers to discharge        Co-evaluation               End of Session Equipment Utilized During Treatment: Gait belt Activity Tolerance: Patient tolerated treatment well Patient left: in chair;with call bell/phone within reach;with chair alarm set Nurse Communication: Mobility status;Precautions PT Visit  Diagnosis: Unsteadiness on feet (R26.81);Other abnormalities of gait and mobility (R26.89);Difficulty in walking, not elsewhere classified (R26.2);Muscle weakness (generalized) (M62.81)    Functional Assessment Tool Used: AM-PAC 6 Clicks Basic Mobility;Clinical judgement Functional Limitation: Mobility: Walking and moving around Mobility: Walking and Moving Around Current Status ([id number](G8978): At least 40 percent but less than 60 percent impaired, limited or restricted Mobility: Walking and Moving Around Goal Status [phone]([id number]): At least 1 percent but less than 20 percent impaired, limited or restricted    Time: 1101-1125 PT Time Calculation (min) (ACUTE ONLY): 24 min   Charges:   PT Evaluation $PT Eval Moderate Complexity: 1 Procedure PT Treatments $Gait Training: 8-22 mins   PT G Codes:   PT G-Codes **NOT FOR INPATIENT CLASS** Functional Assessment Tool Used: AM-PAC 6 Clicks Basic Mobility;Clinical judgement Functional Limitation: Mobility: Walking and moving around Mobility: Walking and Moving Around Current Status (U9811(G8978): At least 40 percent but less than 60 percent impaired, limited or restricted Mobility: Walking and Moving Around Goal Status [phone]([id number]): At least 1 percent but less than 20 percent impaired, limited or restricted     Berton MountLogan S Alysson Geist 03/11/2016, 11:41 AM [phone]

## 2016-03-12 LAB — HEMOGLOBIN A1C
Hgb A1c MFr Bld: 5.1 % (ref 4.8–5.6)
Mean Plasma Glucose: 100 mg/dL

## 2017-03-14 ENCOUNTER — Ambulatory Visit (INDEPENDENT_AMBULATORY_CARE_PROVIDER_SITE_OTHER): Payer: Medicare Other | Admitting: Podiatry

## 2017-03-14 ENCOUNTER — Ambulatory Visit: Payer: Self-pay | Admitting: Podiatry

## 2017-03-14 ENCOUNTER — Encounter: Payer: Self-pay | Admitting: Podiatry

## 2017-03-14 VITALS — BP 197/88 | HR 68 | Wt 148.0 lb

## 2017-03-14 DIAGNOSIS — B351 Tinea unguium: Secondary | ICD-10-CM

## 2017-03-14 DIAGNOSIS — M79671 Pain in right foot: Secondary | ICD-10-CM | POA: Diagnosis not present

## 2017-03-14 DIAGNOSIS — M79672 Pain in left foot: Secondary | ICD-10-CM | POA: Diagnosis not present

## 2017-03-14 NOTE — Progress Notes (Signed)
SUBJECTIVE: 84 y.o. year old female presents complaining of painful feet. She was accompanied by her care taker.  Patient is alert but not consistent with her complaints.  Positive medial history of  TA, Hypertension, Dementia, Confusion, and Anemia.  Review of Systems  Constitutional: Negative.   HENT: Negative.   Respiratory: Negative.   Gastrointestinal: Negative.   Genitourinary: Negative.   Musculoskeletal: Negative.   Skin: Negative.      OBJECTIVE: DERMATOLOGIC EXAMINATION: Nails: Thick dystrophic nails x 10. No open skin lesions noted.  VASCULAR EXAMINATION OF LOWER LIMBS: All pedal pulses are palpable with normal pulsation.  Capillary Filling times within 3 seconds in all digits.  Mild forefoot edema noted on right foot.  Temperature gradient from tibial crest to dorsum of foot is within normal bilateral.  NEUROLOGIC EXAMINATION OF THE LOWER LIMBS: All epicritic and tactile sensations grossly intact. Sharp and Dull discriminatory sensations at the plantar ball of hallux is intact bilateral.   MUSCULOSKELETAL EXAMINATION: No gross deformities seen.  ASSESSMENT: Painful feet. Onychomycosis x 10.  PLAN: Reviewed findings with care taker. All nails debrided. May return in 3 month.

## 2017-03-14 NOTE — Patient Instructions (Signed)
Seen for painful feet. Noted of  hypertrophic nails. All nails debrided. Return in 3 months or as needed.

## 2017-05-10 ENCOUNTER — Emergency Department (HOSPITAL_COMMUNITY): Payer: Medicare Other

## 2017-05-10 ENCOUNTER — Encounter (HOSPITAL_COMMUNITY): Payer: Self-pay

## 2017-05-10 ENCOUNTER — Other Ambulatory Visit: Payer: Self-pay

## 2017-05-10 ENCOUNTER — Inpatient Hospital Stay (HOSPITAL_COMMUNITY)
Admission: EM | Admit: 2017-05-10 | Discharge: 2017-05-18 | DRG: 056 | Disposition: A | Discharge status: Home Health | Payer: Medicare Other | Attending: Family Medicine | Admitting: Family Medicine

## 2017-05-10 DIAGNOSIS — G3109 Other frontotemporal dementia: Secondary | ICD-10-CM | POA: Diagnosis not present

## 2017-05-10 DIAGNOSIS — I10 Essential (primary) hypertension: Secondary | ICD-10-CM | POA: Diagnosis present

## 2017-05-10 DIAGNOSIS — E876 Hypokalemia: Secondary | ICD-10-CM | POA: Diagnosis not present

## 2017-05-10 DIAGNOSIS — Z87891 Personal history of nicotine dependence: Secondary | ICD-10-CM

## 2017-05-10 DIAGNOSIS — I959 Hypotension, unspecified: Secondary | ICD-10-CM | POA: Diagnosis present

## 2017-05-10 DIAGNOSIS — Z91013 Allergy to seafood: Secondary | ICD-10-CM

## 2017-05-10 DIAGNOSIS — Z79899 Other long term (current) drug therapy: Secondary | ICD-10-CM

## 2017-05-10 DIAGNOSIS — Z7982 Long term (current) use of aspirin: Secondary | ICD-10-CM

## 2017-05-10 DIAGNOSIS — G9341 Metabolic encephalopathy: Secondary | ICD-10-CM | POA: Diagnosis present

## 2017-05-10 DIAGNOSIS — G9340 Encephalopathy, unspecified: Secondary | ICD-10-CM | POA: Diagnosis present

## 2017-05-10 DIAGNOSIS — Z515 Encounter for palliative care: Secondary | ICD-10-CM

## 2017-05-10 DIAGNOSIS — F0280 Dementia in other diseases classified elsewhere without behavioral disturbance: Secondary | ICD-10-CM | POA: Diagnosis present

## 2017-05-10 DIAGNOSIS — B999 Unspecified infectious disease: Secondary | ICD-10-CM | POA: Diagnosis not present

## 2017-05-10 DIAGNOSIS — Z8673 Personal history of transient ischemic attack (TIA), and cerebral infarction without residual deficits: Secondary | ICD-10-CM

## 2017-05-10 DIAGNOSIS — Z7189 Other specified counseling: Secondary | ICD-10-CM

## 2017-05-10 DIAGNOSIS — E785 Hyperlipidemia, unspecified: Secondary | ICD-10-CM | POA: Diagnosis present

## 2017-05-10 LAB — CBC
HEMATOCRIT: 34.8 % — AB (ref 36.0–46.0)
Hemoglobin: 11.2 g/dL — ABNORMAL LOW (ref 12.0–15.0)
MCH: 27.6 pg (ref 26.0–34.0)
MCHC: 32.2 g/dL (ref 30.0–36.0)
MCV: 85.7 fL (ref 78.0–100.0)
Platelets: 207 10*3/uL (ref 150–400)
RBC: 4.06 MIL/uL (ref 3.87–5.11)
RDW: 15.1 % (ref 11.5–15.5)
WBC: 3.9 10*3/uL — ABNORMAL LOW (ref 4.0–10.5)

## 2017-05-10 LAB — I-STAT CHEM 8, ED
BUN: 21 mg/dL — AB (ref 6–20)
CREATININE: 0.9 mg/dL (ref 0.44–1.00)
Calcium, Ion: 1.14 mmol/L — ABNORMAL LOW (ref 1.15–1.40)
Chloride: 105 mmol/L (ref 101–111)
Glucose, Bld: 113 mg/dL — ABNORMAL HIGH (ref 65–99)
HCT: 37 % (ref 36.0–46.0)
HEMOGLOBIN: 12.6 g/dL (ref 12.0–15.0)
Potassium: 3.9 mmol/L (ref 3.5–5.1)
Sodium: 141 mmol/L (ref 135–145)
TCO2: 22 mmol/L (ref 22–32)

## 2017-05-10 LAB — URINALYSIS, ROUTINE W REFLEX MICROSCOPIC
Bilirubin Urine: NEGATIVE
Glucose, UA: NEGATIVE mg/dL
HGB URINE DIPSTICK: NEGATIVE
KETONES UR: NEGATIVE mg/dL
LEUKOCYTES UA: NEGATIVE
Nitrite: NEGATIVE
PH: 5 (ref 5.0–8.0)
PROTEIN: NEGATIVE mg/dL
Specific Gravity, Urine: 1.045 — ABNORMAL HIGH (ref 1.005–1.030)

## 2017-05-10 LAB — COMPREHENSIVE METABOLIC PANEL
ALBUMIN: 3.5 g/dL (ref 3.5–5.0)
ALK PHOS: 81 U/L (ref 38–126)
ALT: 15 U/L (ref 14–54)
AST: 32 U/L (ref 15–41)
Anion gap: 8 (ref 5–15)
BILIRUBIN TOTAL: 0.3 mg/dL (ref 0.3–1.2)
BUN: 19 mg/dL (ref 6–20)
CO2: 25 mmol/L (ref 22–32)
Calcium: 9.4 mg/dL (ref 8.9–10.3)
Chloride: 108 mmol/L (ref 101–111)
Creatinine, Ser: 1.03 mg/dL — ABNORMAL HIGH (ref 0.44–1.00)
GFR calc Af Amer: 57 mL/min — ABNORMAL LOW (ref >60)
GFR, EST NON AFRICAN AMERICAN: 50 mL/min — AB (ref >60)
GLUCOSE: 114 mg/dL — AB (ref 65–99)
POTASSIUM: 4.1 mmol/L (ref 3.5–5.1)
Sodium: 141 mmol/L (ref 135–145)
TOTAL PROTEIN: 7.1 g/dL (ref 6.5–8.1)

## 2017-05-10 LAB — APTT: APTT: 32 s (ref 24–36)

## 2017-05-10 LAB — DIFFERENTIAL
BASOS ABS: 0 10*3/uL (ref 0.0–0.1)
Basophils Relative: 0 %
EOS ABS: 0.1 10*3/uL (ref 0.0–0.7)
Eosinophils Relative: 3 %
LYMPHS ABS: 1.4 10*3/uL (ref 0.7–4.0)
LYMPHS PCT: 35 %
MONOS PCT: 14 %
Monocytes Absolute: 0.5 10*3/uL (ref 0.1–1.0)
NEUTROS PCT: 48 %
Neutro Abs: 1.9 10*3/uL (ref 1.7–7.7)

## 2017-05-10 LAB — I-STAT TROPONIN, ED: TROPONIN I, POC: 0 ng/mL (ref 0.00–0.08)

## 2017-05-10 LAB — PROTIME-INR
INR: 1.06
Prothrombin Time: 13.7 seconds (ref 11.4–15.2)

## 2017-05-10 MED ORDER — IOPAMIDOL (ISOVUE-370) INJECTION 76%
50.0000 mL | Freq: Once | INTRAVENOUS | Status: AC | PRN
  Administered 2017-05-10: 50 mL via INTRAVENOUS

## 2017-05-10 NOTE — Consult Note (Signed)
Requesting Physician: Dr. Freida Busman    Chief Complaint: Stroke  History obtained from:  Patient and Chart   HPI:                                                                                                                                         Marissa Chung is an 84 y.o. female with history of frontal lobe dementia, TIA 3 years ago, and frontal lobe dementia presents to ED from SNF for left sided gaze deviation and AMS. Per EMS patient normally walks around by herself, talks, and can feed herself. In the ED the patient was ordered CTA head and neck, CT head, chem 8, CBC, differential, CMP results pending. She has a history of frontal lobe dementia. Per her daughter, the patient has "good days and bad days", has visual hallucinations, but no shuffling gait and no tremor. The daughter denies ever seeing the patient "acting out her dreams" while sleeping, but does endorse sleep-talking.   Date last known well: Date: 05/10/2017 Time last known well: Time: 15:30 tPA Given: No: Overall clinical picture most consistent with delirium than stroke   Past Medical History:  Diagnosis Date  . Cataract    BILATERAL  . Frontal lobe dementia   . History of shingles   . Hypertension     Past Surgical History:  Procedure Laterality Date  . FINGER SURGERY Right    pinky finger    Social History:  reports that she has quit smoking. She has never used smokeless tobacco. She reports that she does not drink alcohol or use drugs.  Allergies:  Allergies  Allergen Reactions  . Shellfish Allergy Hives    Medications:                                                                                                                          Aspirin  Lactulose 10gm/18ml solution Lisinopril  memantine  Nystatin ointment zyprexa 2.5mg  pravastation  risperdal 0.25mg  exelon 9.5mg / 24hr   ROS:  History obtained from chart review and EMS  General ROS: negative for - chills, fatigue, fever, night sweats, weight gain or weight loss Psychological ROS: negative for - frontal lobe dementia Musculoskeletal ROS: negative for - joint swelling or muscular weakness Neurological ROS: as noted in HPI Dermatological ROS: negative for rash and skin lesion changes   General Examination:                                                                                                      There were no vitals taken for this visit.  HEENT-  Normocephalic, no lesions, without obvious abnormality.  Normal external eye and conjunctiva.   Cardiovascular- S1-S2 audible, pulses palpable throughout   Lungs-no rhonchi or wheezing noted, no excessive working breathing.  Saturations within normal limits Abdomen- All 4 quadrants palpated and nontender Extremities- Warm, dry and intact Musculoskeletal-no joint tenderness, deformity or swelling Skin-warm and dry, no hyperpigmentation, vitiligo, or suspicious lesions  Neurological Examination Mental Status: Awake, disoriented, will not talk initially, just moans. Later during exam, patient will make short grammatical exclamations in response to noxious stimuli. Poor attention. Drowsy to somnolent. No dysarthria. Does not follow commands. Eyes deviated to left for most of the exam, but will move eyes to right both with tracking and with saccades towards daughter when she stands on patient's right.  Cranial Nerves: Ptosis not present, extra-ocular motions as described above. No nystagmus. PERRL. Tendency to gaze to left, but will gaze to right at times. Grimace is symmetric. Reacts to tactile stimulation bilaterally. Responds to painful stimuli. Hearing intact to daughter's voice.  Motor: Right : Upper extremity   4/5    Left:     Upper extremity   4/5  Lower extremity   4/5     Lower extremity   4/5 Tone and bulk:normal  tone throughout; no atrophy noted Sensory: Reacts to tactile stimuli x 4.  Deep Tendon Reflexes: 1+ and symmetric throughout except ankle reflexes, which are absent. Plantars: Right: downgoing  Left: downgoing Cerebellar: Patient uncooperative with exam.  Gait: Deferred due to falls risk concerns   Lab Results: Basic Metabolic Panel: Recent Labs  Lab 05/10/17 1708  NA 141  K 3.9  CL 105  GLUCOSE 113*  BUN 21*  CREATININE 0.90    CBC: Recent Labs  Lab 05/10/17 1701 05/10/17 1708  WBC 3.9*  --   NEUTROABS 1.9  --   HGB 11.2* 12.6  HCT 34.8* 37.0  MCV 85.7  --   PLT 207  --     Lipid Panel: No results for input(s): CHOL, TRIG, HDL, CHOLHDL, VLDL, LDLCALC in the last 168 hours.  CBG: No results for input(s): GLUCAP in the last 168 hours.  Imaging: No results found.  Assessment and plan discussed with with attending physician and they are in agreement.   Valentina Lucks, NP-C [facility] [phone] 05/10/2017, 5:18 PM   Assessment: 84 y.o. female with acute leftward gaze deviation and confusion 1. Overall presentation most consistent with a hypoactive delirium. Will gaze to right to salient stimuli, therefore leftward gaze  is felt to be behavioral. Speech pattern more consistent with confusion than a receptive or expressive aphasia.  2. Stroke Risk Factors - hyperlipidemia and hypertension  Recommendations: --MRI/MRA of the brain without contrast. If positive for stroke, obtain the remainder of the stroke work up.  --EEG --PT consult, OT consult, Speech consult --Continue Exelon. --Discontinue Namenda --Continue ASA --Telemetry monitoring --Frequent neuro checks --NPO until passes stroke swallow screen --please page stroke NP  Or  PA  Or MD from 8am -4 pm  as this patient from this time will be  followed by the stroke.   You can look them up on [email or web address]  Password [email or web address]  I have seen and examined the patient. I have amended the assessment and  recommendations.  Electronically signed: Dr. Caryl Pina

## 2017-05-10 NOTE — ED Triage Notes (Signed)
Patient from SNF where EMS called out for left sided gaze and patient not acting herself.  EMS reports left eye gaze forced deviation from midline without being able to look to the right. Family states patient has dementia and is usually confused, patient recently placed on antibiotics for recurrent UTIs.

## 2017-05-10 NOTE — ED Provider Notes (Signed)
[facility] EMERGENCY DEPARTMENT Provider Note   CSN: [id number] Arrival date & time: 05/10/17  1657     History   Chief Complaint Chief Complaint  Patient presents with  . Code Stroke    LNW 1330    HPI Chevonne Bostrom is a 84 y.o. female.  HPI  Patient is an 84 year old female with a past medical history of frontal lobe dementia, TIA 3 years ago who comes in today brought by EMS from her care facility.  Per EMS patient is able to perform the majority of her ADLs without assistance, however today patient was taking a walk when she had a syncopal episode.  Patient had a period of decreased level of consciousness which resolved after which patient became confused and combative and would not answer questions appropriately.    Past Medical History:  Diagnosis Date  . Cataract    BILATERAL  . Frontal lobe dementia   . History of shingles   . Hypertension     Patient Active Problem List   Diagnosis Date Noted  . Acute encephalopathy 08/27/2013  . Confusion 08/27/2013  . Accelerated hypertension 07/26/2013  . Frontotemporal dementia 07/26/2013  . Anemia 07/26/2013  . Dyslipidemia 07/26/2013    Past Surgical History:  Procedure Laterality Date  . FINGER SURGERY Right    pinky finger     OB History   None      Home Medications    Prior to Admission medications   Medication Sig Start Date End Date Taking? Authorizing Provider  aspirin EC 325 MG tablet Take 325 mg by mouth daily.    Yes [provider]  feeding supplement, ENSURE COMPLETE, (ENSURE COMPLETE) LIQD Take 237 mLs by mouth 2 (two) times daily between meals. 07/28/13  Yes Dhungel, Nishant, MD  lactulose (CHRONULAC) 10 GM/15ML solution Take 20 g by mouth 2 (two) times daily.   Yes [provider]  lisinopril (PRINIVIL,ZESTRIL) 20 MG tablet Take 1 tablet (20 mg total) by mouth daily. 03/11/16 05/10/17 Yes Richarda Overlie, MD  memantine (NAMENDA) 5 MG tablet Take 5 mg by mouth 2  (two) times daily.   Yes [provider]  nystatin ointment (MYCOSTATIN) Apply 1 application topically 3 (three) times daily. To lower back   Yes [provider]  OLANZapine (ZYPREXA) 2.5 MG tablet Take 2.5 mg by mouth at bedtime.   Yes [provider]  pravastatin (PRAVACHOL) 20 MG tablet Take 1 tablet (20 mg total) by mouth daily at 6 PM. 03/12/16  Yes Marvel Plan, MD  risperiDONE (RISPERDAL) 0.25 MG tablet Take 1-2 tablets (0.25-0.5 mg total) by mouth 2 (two) times daily. .25 mg am and .50 mg at bedtime Patient taking differently: Take 1 mg by mouth at bedtime.  08/29/13  Yes Osvaldo Shipper, MD  rivastigmine (EXELON) 9.5 mg/24hr Place 9.5 mg onto the skin daily.   Yes [provider]    Family History History reviewed. No pertinent family history.  Social History Social History   Tobacco Use  . Smoking status: Former Games developer  . Smokeless tobacco: Never Used  . Tobacco comment: quit smoking over 20 years ago   Substance Use Topics  . Alcohol use: No  . Drug use: No     Allergies   Shellfish allergy   Review of Systems Review of Systems  Unable to perform ROS: Mental status change     Physical Exam Updated Vital Signs BP (!) 178/93   Pulse 65   Temp 98.3 F (  36.8 C) (Rectal)   Resp 18   Ht  (1.702 m)   Wt 65.8 kg (145 lb)   SpO2 100%   BMI 22.71 kg/m   Physical Exam  Constitutional: She appears well-developed and well-nourished.  HENT:  Head: Normocephalic and atraumatic.  Eyes: Pupils are equal, round, and reactive to light. Conjunctivae are normal.  Neck: Neck supple.  Cardiovascular: Normal rate and regular rhythm.  Pulmonary/Chest: Effort normal and breath sounds normal. No respiratory distress. She has no wheezes. She has no rales.  Abdominal: Soft.  Musculoskeletal: She exhibits no edema.  Neurological: She is alert. She displays normal reflexes. No cranial nerve deficit. She exhibits normal muscle tone.  Unable  to perform sensory, range of motion or complete cranial nerve exam given patient's confused state.  Skin: Skin is warm and dry.  Psychiatric:  Patient agitated and not following commands or responding to questions  Nursing note and vitals reviewed.    ED Treatments / Results  Labs (all labs ordered are listed, but only abnormal results are displayed) Labs Reviewed  CBC - Abnormal; Notable for the following components:      Result Value   WBC 3.9 (*)    Hemoglobin 11.2 (*)    HCT 34.8 (*)    All other components within normal limits  COMPREHENSIVE METABOLIC PANEL - Abnormal; Notable for the following components:   Glucose, Bld 114 (*)    Creatinine, Ser 1.03 (*)    GFR calc non Af Amer 50 (*)    GFR calc Af Amer 57 (*)    All other components within normal limits  URINALYSIS, ROUTINE W REFLEX MICROSCOPIC - Abnormal; Notable for the following components:   Specific Gravity, Urine 1.045 (*)    All other components within normal limits  I-STAT CHEM 8, ED - Abnormal; Notable for the following components:   BUN 21 (*)    Glucose, Bld 113 (*)    Calcium, Ion 1.14 (*)    All other components within normal limits  PROTIME-INR  APTT  DIFFERENTIAL  I-STAT TROPONIN, ED  CBG MONITORING, ED    EKG EKG Interpretation  Date/Time:  Thursday May 10 2017 17:41:30 EDT Ventricular Rate:  76 PR Interval:    QRS Duration: 91 QT Interval:  377 QTC Calculation: 424 R Axis:   35 Text Interpretation:  Sinus rhythm Confirmed by Lorre Nick ([id number]) on 05/10/2017 8:47:04 PM   Radiology Ct Angio Head W Or Wo Contrast  Addendum Date: 05/10/2017   ADDENDUM REPORT: 05/10/2017 18:18 ADDENDUM: These results were called by telephone at the time of interpretation on 05/10/2017 at 6:18 pm to Dr. Caryl Pina , who verbally acknowledged these results. Electronically Signed   By: Mitzi Hansen M.D.   On: 05/10/2017 18:18   Result Date: 05/10/2017 CLINICAL DATA:  84 y/o  F; stroke for  follow-up. EXAM: CT ANGIOGRAPHY HEAD AND NECK TECHNIQUE: Multidetector CT imaging of the head and neck was performed using the standard protocol during bolus administration of intravenous contrast. Multiplanar CT image reconstructions and MIPs were obtained to evaluate the vascular anatomy. Carotid stenosis measurements (when applicable) are obtained utilizing NASCET criteria, using the distal internal carotid diameter as the denominator. CONTRAST:  97 cc Isovue 370 COMPARISON:  05/10/2017 CT head. 03/11/2016 CT angiogram head and neck. FINDINGS: CTA NECK FINDINGS Aortic arch: Standard branching. Imaged portion shows no evidence of aneurysm or dissection. No significant stenosis of the major arch vessel origins. Moderate calcific atherosclerosis of the aorta. Right  carotid system: No evidence of dissection, stenosis (50% or greater) or occlusion. Calcified plaque of the right carotid bifurcation with mild less than 50% proximal ICA stenosis. Left carotid system: No evidence of dissection, stenosis (50% or greater) or occlusion. Vertebral arteries: Left dominant. No evidence of dissection, stenosis (50% or greater) or occlusion. Skeleton: Moderate spondylosis of the cervical spine greatest at the C6-7 level. No high-grade bony canal stenosis. Other neck: Left lobe of thyroid nodule measuring up to 14 mm. Upper chest: Right upper lobe pulmonary nodule is decreased in size measuring 9 mm, previously 11 mm (series 9, image 175). Review of the MIP images confirms the above findings CTA HEAD FINDINGS Anterior circulation: Left A2/3 short segment of moderate, increased from prior study (series 13, image 77). No large vessel occlusion, aneurysm, or vascular malformation. Posterior circulation: Right P2 mild stenosis. No large vessel occlusion, aneurysm, or vascular malformation. Venous sinuses: As permitted by contrast timing, patent. Anatomic variants: Small bilateral posterior communicating arteries. No anterior  communicating artery identified. Review of the MIP images confirms the above findings IMPRESSION: 1. Patent anterior and posterior intracranial circulation. No large vessel occlusion, aneurysm, or vascular malformation. 2. Patent carotid and vertebral systems. No dissection, aneurysm, or hemodynamically significant stenosis by NASCET criteria. 3. Moderate left A2/3 stenosis, increased from prior study. 4. Right P2 mild stenosis. 5. Right proximal ICA mild less than 50% stenosis with calcified plaque. 6. Right upper lobe pulmonary nodule is decreased in size measuring 9 mm, previously 11 mm. Electronically Signed: By: Mitzi Hansen M.D. On: 05/10/2017 18:03   Ct Angio Neck W And/or Wo Contrast  Addendum Date: 05/10/2017   ADDENDUM REPORT: 05/10/2017 18:18 ADDENDUM: These results were called by telephone at the time of interpretation on 05/10/2017 at 6:18 pm to Dr. Caryl Pina , who verbally acknowledged these results. Electronically Signed   By: Mitzi Hansen M.D.   On: 05/10/2017 18:18   Result Date: 05/10/2017 CLINICAL DATA:  84 y/o  F; stroke for follow-up. EXAM: CT ANGIOGRAPHY HEAD AND NECK TECHNIQUE: Multidetector CT imaging of the head and neck was performed using the standard protocol during bolus administration of intravenous contrast. Multiplanar CT image reconstructions and MIPs were obtained to evaluate the vascular anatomy. Carotid stenosis measurements (when applicable) are obtained utilizing NASCET criteria, using the distal internal carotid diameter as the denominator. CONTRAST:  97 cc Isovue 370 COMPARISON:  05/10/2017 CT head. 03/11/2016 CT angiogram head and neck. FINDINGS: CTA NECK FINDINGS Aortic arch: Standard branching. Imaged portion shows no evidence of aneurysm or dissection. No significant stenosis of the major arch vessel origins. Moderate calcific atherosclerosis of the aorta. Right carotid system: No evidence of dissection, stenosis (50% or greater) or occlusion.  Calcified plaque of the right carotid bifurcation with mild less than 50% proximal ICA stenosis. Left carotid system: No evidence of dissection, stenosis (50% or greater) or occlusion. Vertebral arteries: Left dominant. No evidence of dissection, stenosis (50% or greater) or occlusion. Skeleton: Moderate spondylosis of the cervical spine greatest at the C6-7 level. No high-grade bony canal stenosis. Other neck: Left lobe of thyroid nodule measuring up to 14 mm. Upper chest: Right upper lobe pulmonary nodule is decreased in size measuring 9 mm, previously 11 mm (series 9, image 175). Review of the MIP images confirms the above findings CTA HEAD FINDINGS Anterior circulation: Left A2/3 short segment of moderate, increased from prior study (series 13, image 77). No large vessel occlusion, aneurysm, or vascular malformation. Posterior circulation: Right P2 mild stenosis. No  large vessel occlusion, aneurysm, or vascular malformation. Venous sinuses: As permitted by contrast timing, patent. Anatomic variants: Small bilateral posterior communicating arteries. No anterior communicating artery identified. Review of the MIP images confirms the above findings IMPRESSION: 1. Patent anterior and posterior intracranial circulation. No large vessel occlusion, aneurysm, or vascular malformation. 2. Patent carotid and vertebral systems. No dissection, aneurysm, or hemodynamically significant stenosis by NASCET criteria. 3. Moderate left A2/3 stenosis, increased from prior study. 4. Right P2 mild stenosis. 5. Right proximal ICA mild less than 50% stenosis with calcified plaque. 6. Right upper lobe pulmonary nodule is decreased in size measuring 9 mm, previously 11 mm. Electronically Signed: By: Mitzi Hansen M.D. On: 05/10/2017 18:03   Dg Chest Port 1 View  Result Date: 05/10/2017 CLINICAL DATA:  UTI. EXAM: PORTABLE CHEST 1 VIEW COMPARISON:  08/27/2013 FINDINGS: 1902 hours. Lungs are hyperexpanded. The lungs are clear  without focal pneumonia, edema, pneumothorax or pleural effusion. Calcified granuloma identified right upper lobe. The cardiopericardial silhouette is within normal limits for size. The visualized bony structures of the thorax are intact. Telemetry leads overlie the chest. IMPRESSION: No active disease. Electronically Signed   By: Kennith Center M.D.   On: 05/10/2017 19:51   Ct Head Code Stroke Wo Contrast  Result Date: 05/10/2017 CLINICAL DATA:  Code stroke. Onset of weakness of the BILATERAL legs. Altered mental status. Dementia. Asymmetric RIGHT arm decreased movement. EXAM: CT HEAD WITHOUT CONTRAST TECHNIQUE: Contiguous axial images were obtained from the base of the skull through the vertex without intravenous contrast. COMPARISON:  CT head, MR head, and CTA head neck 03/10/2016, 03/11/2016. FINDINGS: Brain: No evidence for acute infarction, hemorrhage, mass lesion, hydrocephalus, or extra-axial fluid. Generalized atrophy. Hypoattenuation of white matter, likely small vessel disease. Vascular: Calcification of the cavernous internal carotid arteries consistent with cerebrovascular atherosclerotic disease. No signs of intracranial large vessel occlusion. Skull: Normal. Negative for fracture or focal lesion. Sinuses/Orbits: No acute finding. Other: None. ASPECTS [facility] Stroke Program Early CT Score) - Ganglionic level infarction (caudate, lentiform nuclei, internal capsule, insula, M1-M3 cortex): 7 - Supraganglionic infarction (M4-M6 cortex): 3 Total score (0-10 with 10 being normal): 10 IMPRESSION: 1. Atrophy and small vessel disease. No acute intracranial abnormality. 2. ASPECTS is 10. These results were communicated to Dr. Otelia Limes at 5:26 pmon 5/9/2019by text page via the [facility] messaging system. Electronically Signed   By: Elsie Stain M.D.   On: 05/10/2017 17:32    Procedures Procedures (including critical care time)  Medications Ordered in ED Medications  iopamidol (ISOVUE-370) 76 % injection 50  mL (50 mLs Intravenous Contrast Given 05/10/17 1719)     Initial Impression / Assessment and Plan / ED Course  I have reviewed the triage vital signs and the nursing notes.  Pertinent labs & imaging results that were available during my care of the patient were reviewed by me and considered in my medical decision making (see chart for details).     Patient initially was code stroke and evaluated by neurology.  CTA of the head showed no acute abnormality or signs of ischemic injury.  Neurology believe patient's condition may have been secondary to hyperactive delirium and a full infectious work-up was conducted.  Patient was afebrile with negative chest x-ray and urinalysis.  Neurology recommend admission with PT OT and speech eval as well as MRI EEG and further observation.  Will admit patient to hospitalist for further evaluation and care.  Final Clinical Impressions(s) / ED Diagnoses   Final diagnoses:  Infection  ED Discharge Orders    None       Caren Griffins, MD 05/10/17 2356

## 2017-05-10 NOTE — ED Provider Notes (Signed)
I saw and evaluated the patient, reviewed the resident's note and I agree with the findings and plan.   EKG Interpretation None     84 year old female presents with acute onset of altered mental status from nursing home.  Does have a history of dementia.  On exam here patient does not follow commands.  She is moving both upper extremities more so on the left.  Does have left-sided gaze.  Suspicion for intracranial hemorrhage.  Has been seen by neurology as well.  CT is pending.   Lorre Nick, MD 05/10/17 [phone]

## 2017-05-10 NOTE — ED Notes (Signed)
Patient able to move eyes to the right when talked with by her daughter. When examined by neurologist patient is responsive to painful stimuli.  Eyes deviated to left initially on exam but when daughter talks to patient she can move her eyes left to right.   TPA ruled out, code stroke canceled by Otelia Limes MD Neurologist at 1750  Dispo to be set by ED provider

## 2017-05-11 ENCOUNTER — Observation Stay (HOSPITAL_COMMUNITY): Payer: Medicare Other

## 2017-05-11 DIAGNOSIS — F0280 Dementia in other diseases classified elsewhere without behavioral disturbance: Secondary | ICD-10-CM

## 2017-05-11 DIAGNOSIS — G3109 Other frontotemporal dementia: Principal | ICD-10-CM

## 2017-05-11 DIAGNOSIS — I10 Essential (primary) hypertension: Secondary | ICD-10-CM

## 2017-05-11 DIAGNOSIS — G9340 Encephalopathy, unspecified: Secondary | ICD-10-CM | POA: Diagnosis not present

## 2017-05-11 LAB — GLUCOSE, CAPILLARY: GLUCOSE-CAPILLARY: 106 mg/dL — AB (ref 65–99)

## 2017-05-11 MED ORDER — VITAMIN B-1 100 MG PO TABS
100.0000 mg | ORAL_TABLET | Freq: Every day | ORAL | Status: DC
  Administered 2017-05-11 – 2017-05-18 (×9): 100 mg via ORAL
  Filled 2017-05-11 (×8): qty 1

## 2017-05-11 MED ORDER — HALOPERIDOL LACTATE 5 MG/ML IJ SOLN
5.0000 mg | Freq: Once | INTRAMUSCULAR | Status: AC
  Administered 2017-05-11: 5 mg via INTRAVENOUS

## 2017-05-11 MED ORDER — OLANZAPINE 2.5 MG PO TABS
2.5000 mg | ORAL_TABLET | Freq: Every day | ORAL | Status: DC
  Administered 2017-05-11 – 2017-05-17 (×7): 2.5 mg via ORAL
  Filled 2017-05-11 (×9): qty 1

## 2017-05-11 MED ORDER — RISPERIDONE 0.5 MG PO TABS
0.2500 mg | ORAL_TABLET | Freq: Every day | ORAL | Status: DC
  Administered 2017-05-11 – 2017-05-16 (×6): 0.25 mg via ORAL
  Filled 2017-05-11 (×6): qty 1

## 2017-05-11 MED ORDER — HYDRALAZINE HCL 20 MG/ML IJ SOLN
10.0000 mg | Freq: Four times a day (QID) | INTRAMUSCULAR | Status: DC | PRN
  Administered 2017-05-12 – 2017-05-15 (×3): 10 mg via INTRAVENOUS
  Filled 2017-05-11 (×3): qty 1

## 2017-05-11 MED ORDER — ACETAMINOPHEN 650 MG RE SUPP: 650.0000 mg | RECTAL | Status: DC | PRN

## 2017-05-11 MED ORDER — LISINOPRIL 20 MG PO TABS
20.0000 mg | ORAL_TABLET | Freq: Every day | ORAL | Status: DC
  Administered 2017-05-11 – 2017-05-12 (×2): 20 mg via ORAL
  Filled 2017-05-11 (×2): qty 1

## 2017-05-11 MED ORDER — DEXTROSE-NACL 5-0.9 % IV SOLN
INTRAVENOUS | Status: DC
  Administered 2017-05-11 – 2017-05-12 (×2): via INTRAVENOUS

## 2017-05-11 MED ORDER — PRAVASTATIN SODIUM 40 MG PO TABS
20.0000 mg | ORAL_TABLET | Freq: Every day | ORAL | Status: DC
  Administered 2017-05-11 – 2017-05-18 (×7): 20 mg via ORAL
  Filled 2017-05-11 (×7): qty 1

## 2017-05-11 MED ORDER — ACETAMINOPHEN 160 MG/5ML PO SOLN: 650.0000 mg | ORAL | Status: DC | PRN

## 2017-05-11 MED ORDER — ACETAMINOPHEN 325 MG PO TABS: 650.0000 mg | ORAL_TABLET | ORAL | Status: DC | PRN

## 2017-05-11 MED ORDER — HYDRALAZINE HCL 20 MG/ML IJ SOLN
20.0000 mg | Freq: Once | INTRAMUSCULAR | Status: AC
  Administered 2017-05-11: 20 mg via INTRAVENOUS
  Filled 2017-05-11: qty 1

## 2017-05-11 MED ORDER — LORAZEPAM 2 MG/ML IJ SOLN
1.0000 mg | Freq: Four times a day (QID) | INTRAMUSCULAR | Status: DC | PRN
  Administered 2017-05-12: 1 mg via INTRAVENOUS
  Filled 2017-05-11: qty 1

## 2017-05-11 MED ORDER — ENSURE ENLIVE PO LIQD
237.0000 mL | Freq: Two times a day (BID) | ORAL | Status: DC
  Administered 2017-05-11 – 2017-05-16 (×8): 237 mL via ORAL

## 2017-05-11 MED ORDER — RISPERIDONE 0.25 MG PO TABS: 0.2500 mg | ORAL_TABLET | Freq: Two times a day (BID) | ORAL | Status: DC

## 2017-05-11 MED ORDER — SODIUM CHLORIDE 0.9 % IV SOLN
INTRAVENOUS | Status: AC
  Administered 2017-05-11: 02:00:00 via INTRAVENOUS

## 2017-05-11 MED ORDER — STROKE: EARLY STAGES OF RECOVERY BOOK
Freq: Once | Status: DC
  Filled 2017-05-11: qty 1

## 2017-05-11 MED ORDER — NYSTATIN 100000 UNIT/GM EX OINT
1.0000 "application " | TOPICAL_OINTMENT | Freq: Three times a day (TID) | CUTANEOUS | Status: DC
  Administered 2017-05-11 – 2017-05-17 (×18): 1 via TOPICAL
  Filled 2017-05-11 (×2): qty 15

## 2017-05-11 MED ORDER — RISPERIDONE 0.5 MG PO TABS
0.5000 mg | ORAL_TABLET | Freq: Every day | ORAL | Status: DC
  Administered 2017-05-11 – 2017-05-17 (×7): 0.5 mg via ORAL
  Filled 2017-05-11 (×7): qty 1

## 2017-05-11 MED ORDER — ASPIRIN EC 325 MG PO TBEC
325.0000 mg | DELAYED_RELEASE_TABLET | Freq: Every day | ORAL | Status: DC
  Administered 2017-05-11 – 2017-05-13 (×3): 325 mg via ORAL
  Filled 2017-05-11 (×3): qty 1

## 2017-05-11 MED ORDER — LORAZEPAM 2 MG/ML IJ SOLN
1.0000 mg | Freq: Once | INTRAMUSCULAR | Status: AC
  Administered 2017-05-11: 1 mg via INTRAVENOUS
  Filled 2017-05-11: qty 1

## 2017-05-11 MED ORDER — LACTULOSE 10 GM/15ML PO SOLN
20.0000 g | Freq: Two times a day (BID) | ORAL | Status: DC
  Administered 2017-05-11 – 2017-05-18 (×14): 20 g via ORAL
  Filled 2017-05-11 (×15): qty 30

## 2017-05-11 MED ORDER — PROSIGHT PO TABS
1.0000 | ORAL_TABLET | Freq: Every day | ORAL | Status: DC
  Administered 2017-05-11 – 2017-05-18 (×8): 1 via ORAL
  Filled 2017-05-11 (×8): qty 1

## 2017-05-11 MED ORDER — SENNOSIDES-DOCUSATE SODIUM 8.6-50 MG PO TABS: 1.0000 | ORAL_TABLET | Freq: Every evening | ORAL | Status: DC | PRN

## 2017-05-11 MED ORDER — RIVASTIGMINE 9.5 MG/24HR TD PT24
9.5000 mg | MEDICATED_PATCH | Freq: Every day | TRANSDERMAL | Status: DC
  Administered 2017-05-11 – 2017-05-18 (×8): 9.5 mg via TRANSDERMAL
  Filled 2017-05-11 (×9): qty 1

## 2017-05-11 MED ORDER — ENOXAPARIN SODIUM 40 MG/0.4ML ~~LOC~~ SOLN
40.0000 mg | SUBCUTANEOUS | Status: DC
  Administered 2017-05-11 – 2017-05-18 (×8): 40 mg via SUBCUTANEOUS
  Filled 2017-05-11 (×8): qty 0.4

## 2017-05-11 MED ORDER — HALOPERIDOL LACTATE 5 MG/ML IJ SOLN
INTRAMUSCULAR | Status: AC
  Filled 2017-05-11: qty 1

## 2017-05-11 NOTE — Progress Notes (Signed)
BP 199/82. Paged NP Bodenheimer. Orders received for   IV Hydralazine once and  IV hydralazine every 6 hours as needed .

## 2017-05-11 NOTE — Progress Notes (Signed)
Patient retaining urine of 390 ml. Text paged Np Bodeheimer for orders.

## 2017-05-11 NOTE — Progress Notes (Signed)
Occupational Therapy Treatment Patient Details Name: Marissa Chung MRN: [id number] DOB: 09/25/1933 Today's Date: 05/11/2017    History of present illness 84 year old female who presented with altered mentation from the skilled nursing facility.  She does have the significant past medical history of frontotemporal dementia, hypertension and history of TIAs. She was noted to have a sudden change in her mentation, left gaze preference, worsening confusion and combativeness.    OT comments  Entered room with bed alarm sounding and pt's feet on floor. Pt apparently restless. Pt mobilized around room with min A to steady pt. Apparently confused. Recommend sitter/low bed/floor mats. Recommend staff ambulate pt when trying to get OOB to reduce restlessness. If able, feel behavior would improve if pt could wear her own clothes instead of the hospital gown.  Follow Up Recommendations  SNF    Equipment Recommendations  None recommended by OT    Recommendations for Other Services      Precautions / Restrictions Precautions Precautions: Fall Restrictions Weight Bearing Restrictions: No       Mobility Bed Mobility Overal bed mobility: Needs Assistance Bed Mobility: Supine to Sit;Sit to Supine     Supine to sit: Supervision Sit to supine: Mod assist   General bed mobility comments: A needed due to conitive deficits  Transfers Overall transfer level: Needs assistance   Transfers: Sit to/from Stand Sit to Stand: Min assist         General transfer comment: unsteady    Balance Overall balance assessment: Needs assistance Sitting-balance support: Feet supported Sitting balance-Leahy Scale: Fair       Standing balance-Leahy Scale: Poor                             ADL either performed or assessed with clinical judgement   ADL Overall ADL's : Needs assistance/impaired Eating/Feeding: Supervision/ safety Eating/Feeding Details (indicate cue type and reason): cues  for completion; unaware of food being on her and in bed Grooming: Moderate assistance Grooming Details (indicate cue type and reason): max vc; difficulty maintaining attention to task Upper Body Bathing: Maximal assistance   Lower Body Bathing: Maximal assistance;Sit to/from stand   Upper Body Dressing : Maximal assistance;Sitting   Lower Body Dressing: Maximal assistance;Sit to/from stand               Functional mobility during ADLs: Minimal assistance General ADL Comments: Pt sitting with 1 hip on EOB with bed alarm sounding. Completed sit - stand and required md A to lift both legs back onto bed due to apparent cognitive deficits     Vision   Additional Comments: unsure of baseline   Perception Perception Comments: appears to be having visual hallucinations   Praxis Praxis Praxis tested?: Deficits Deficits: Initiation;Organization    Cognition Arousal/Alertness: Awake/alert Behavior During Therapy: Restless;Impulsive Overall Cognitive Status: No family/caregiver present to determine baseline cognitive functioning                                          Exercises     Shoulder Instructions       General Comments      Pertinent Vitals/ Pain       Pain Assessment: Faces Faces Pain Scale: No hurt  Home Living Family/patient expects to be discharged to:: Skilled nursing facility  Prior Functioning/Environment Level of Independence: Needs assistance            Frequency           Progress Toward Goals  OT Goals(current goals can now be found in the care plan section)     Acute Rehab OT Goals Patient Stated Goal: unable to state OT Goal Formulation: Patient unable to participate in goal setting  Plan Discharge plan remains appropriate    Co-evaluation                 AM-PAC PT "6 Clicks" Daily Activity     Outcome Measure   Help from another person eating  meals?: A Little Help from another person taking care of personal grooming?: A Lot Help from another person toileting, which includes using toliet, bedpan, or urinal?: A Lot Help from another person bathing (including washing, rinsing, drying)?: A Lot Help from another person to put on and taking off regular upper body clothing?: A Lot Help from another person to put on and taking off regular lower body clothing?: A Lot 6 Click Score: 13    End of Session    OT Visit Diagnosis: Other symptoms and signs involving cognitive function;Unsteadiness on feet (R26.81)   Activity Tolerance Other (comment)(limited by confusion)   Patient Left in bed;with call bell/phone within reach;with bed alarm set   Nurse Communication Mobility status;Other (comment)(need for safety sitter; low bed; floor mats)        Time: 4098-1191 OT Time Calculation (min): 15 min  Charges: OT General Charges $OT Visit: 1 Visit  OT Treatments $Self Care/Home Management : 8-22 mins  Luisa Dago, OT/L  OT Clinical Specialist [phone]    [facility] 05/11/2017, 4:19 PM

## 2017-05-11 NOTE — Progress Notes (Signed)
PROGRESS NOTE    Marissa Chung  JYN:[id number] DOB: 1933/12/27 DOA: 05/10/2017 PCP: System, Pcp Not In    Brief Narrative:  84 year old female who presented with altered mentation from the skilled nursing facility.  She does have the significant past medical history of frontotemporal dementia, hypertension and history of TIAs. She was noted to have a sudden change in her mentation, left gaze preference, worsening confusion and combativeness.  Apparently at baseline she is able to independently ambulate and feed herself.  On the initial physical examination blood pressure 142/68, heart rate 66, respiratory rate 18, temperature 98.3, oxygen saturation 100%.  Moist mucous membranes, lungs are clear to auscultation bilaterally, heart S1-S2 present and rhythmic, the abdomen was soft nontender, no lower extremity edema, patient was somnolent but nonfocal.  Sodium 141, potassium 4.1, chloride 108, bicarb 25, glucose 114, BUN 19, creatinine 1.0, white count 3.9, hemoglobin 9.2, hematocrit 34.8, platelets 207.  Urinalysis with specific gravity 1.045, negative protein.  Head CT with no acute changes.  Chest x-ray with left rotation, no infiltrates.  EKG sinus rhythm, normal axis, normal intervals, poor R wave progression.  Patient was admitted to the hospital with a working diagnosis of metabolic encephalopathy, to rule out acute cerebrovascular accident.    Assessment & Plan:   Principal Problem:   Acute encephalopathy Active Problems:   Accelerated hypertension   Frontotemporal dementia   1. Metabolic encephalopathy in the setting of frontotemporal dementia. Patient continue to be confused and disorientated. Will continue with conservative medical therapy, neuro checks and aspiration precautions. Patient likely has developed delirium. Follow on EEG, brain MRI and neurology recommendations. Will add thiamine and dextrose. Olanzapine at night, continue risperiodone and rivastigmine   2. HTN. Will  continue blood pressure control with lisinopril and as needed hydralazine.      DVT prophylaxis: enoxaparin   Code Status: full Family Communication: no family at the bedside  Disposition Plan: snf when neurologically better   Consultants:   Neurology   Procedures:     Antimicrobials:       Subjective: Patient continue to be poorly responsive, denies any pain or dyspnea. Limited history due to confusion.   Objective: Vitals:   05/11/17 0300 05/11/17 0350 05/11/17 0500 05/11/17 0600  BP: (!) 148/71 (!) 141/76 (!) 155/71 (!) 152/134  Pulse: 66 (!) 44    Resp: 17 (!) Temp:      TempSrc:      SpO2: 100% 95%    Weight:      Height:        Intake/Output Summary (Last 24 hours) at 05/11/2017 1148 Last data filed at 05/11/2017 0538 Gross per 24 hour  Intake -  Output 400 ml  Net -400 ml   Filed Weights   05/10/17 1841 05/11/17 0200  Weight: 65.8 kg (145 lb) 60.7 kg (133 lb 13.1 oz)    Examination:   General: deconditioned  Neurology: somnolent, disorientated, follows simple commands, not agitated.   E ENT: mild pallor, no icterus, oral mucosa moist Cardiovascular: No JVD. S1-S2 present, rhythmic, no gallops, rubs, or murmurs. No lower extremity edema. Pulmonary: decreased breath sounds bilaterally, adequate air movement, no wheezing, rhonchi or rales. Gastrointestinal. Abdomen with no organomegaly, non tender, no rebound or guarding Skin. No rashes Musculoskeletal: no joint deformities     Data Reviewed: I have personally reviewed following labs and imaging studies  CBC: Recent Labs  Lab 05/10/17 1701 05/10/17 1708  WBC 3.9*  --   NEUTROABS 1.9  --  HGB 11.2* 12.6  HCT 34.8* 37.0  MCV 85.7  --   PLT 207  --    Basic Metabolic Panel: Recent Labs  Lab 05/10/17 1701 05/10/17 1708  NA 141 141  K 4.1 3.9  CL 108 105  CO2 25  --   GLUCOSE 114* 113*  BUN 19 21*  CREATININE 1.03* 0.90  CALCIUM 9.4  --    GFR: Estimated Creatinine  Clearance: 47 mL/min (by C-G formula based on SCr of 0.9 mg/dL). Liver Function Tests: Recent Labs  Lab 05/10/17 1701  AST 32  ALT 15  ALKPHOS 81  BILITOT 0.3  PROT 7.1  ALBUMIN 3.5   No results for input(s): LIPASE, AMYLASE in the last 168 hours. No results for input(s): AMMONIA in the last 168 hours. Coagulation Profile: Recent Labs  Lab 05/10/17 1701  INR 1.06   Cardiac Enzymes: No results for input(s): CKTOTAL, CKMB, CKMBINDEX, TROPONINI in the last 168 hours. BNP (last 3 results) No results for input(s): PROBNP in the last 8760 hours. HbA1C: No results for input(s): HGBA1C in the last 72 hours. CBG: Recent Labs  Lab 05/10/17 1700  GLUCAP 106*   Lipid Profile: No results for input(s): CHOL, HDL, LDLCALC, TRIG, CHOLHDL, LDLDIRECT in the last 72 hours. Thyroid Function Tests: No results for input(s): TSH, T4TOTAL, FREET4, T3FREE, THYROIDAB in the last 72 hours. Anemia Panel: No results for input(s): VITAMINB12, FOLATE, FERRITIN, TIBC, IRON, RETICCTPCT in the last 72 hours.    Radiology Studies: I have reviewed all of the imaging during this hospital visit personally     Scheduled Meds: .  stroke: mapping our early stages of recovery book   Does not apply Once  . aspirin EC  325 mg Oral Daily  . enoxaparin (LOVENOX) injection  40 mg Subcutaneous Q24H  . feeding supplement (ENSURE ENLIVE)  237 mL Oral BID BM  . lactulose  20 g Oral BID  . lisinopril  20 mg Oral Daily  . nystatin ointment  1 application Topical TID  . OLANZapine  2.5 mg Oral QHS  . pravastatin  20 mg Oral q1800  . risperiDONE  0.25 mg Oral Daily  . risperiDONE  0.5 mg Oral QHS  . rivastigmine  9.5 mg Transdermal Daily   Continuous Infusions:   LOS: 0 days        Lynnsey Barbara Annett Gula, MD [facility] Pager [phone]

## 2017-05-11 NOTE — H&P (Signed)
History and Physical    Marissa Chung ZOX:[id number] DOB: 09/03/1933 DOA: 05/10/2017  PCP: System, Pcp Not In   Patient coming from: SNF   Chief Complaint: AMS   HPI: Marissa Chung is a 84 y.o. female with medical history significant for frontotemporal dementia, hypertension, and history of TIA, now presenting from her nursing facility for evaluation of altered mental status.  Patient reportedly talks, feeds herself, and ambulates on her own, but was noted today to have an apparent gaze deviation to the left and was not talking as usual, seemingly with increased confusion and a period of combativeness.  She may have fallen prior to this but history is limited.  ED Course: Upon arrival to the ED, patient is found to be afebrile, saturating well on room air, mildly hypertensive, and vitals otherwise normal.  EKG features a sinus rhythm, chest x-ray is negative for acute cardiopulmonary disease, and noncontrast head CT is negative for acute intracranial abnormality.  CTA head and neck is negative for large vessel occlusion, dissection, or hemodynamically significant stenosis.  Chemistry panel is notable for a slight renal insufficiency and CBC features a slight leukopenia and stable mild anemia.  INR and troponin are normal and urinalysis is notable for an elevated specific gravity.  Neurology canceled the code stroke, feeling this is more likely delirium, and recommends medical admission for further evaluation.  Review of Systems:  Unable to complete ROS secondary to patient's clinical condition.  Past Medical History:  Diagnosis Date  . Cataract    BILATERAL  . Frontal lobe dementia   . History of shingles   . Hypertension     Past Surgical History:  Procedure Laterality Date  . FINGER SURGERY Right    pinky finger     reports that she has quit smoking. She has never used smokeless tobacco. She reports that she does not drink alcohol or use drugs.  Allergies  Allergen Reactions    . Shellfish Allergy Hives    History reviewed. No pertinent family history.   Prior to Admission medications   Medication Sig Start Date End Date Taking? Authorizing Provider  aspirin EC 325 MG tablet Take 325 mg by mouth daily.    Yes [provider]  feeding supplement, ENSURE COMPLETE, (ENSURE COMPLETE) LIQD Take 237 mLs by mouth 2 (two) times daily between meals. 07/28/13  Yes Dhungel, Nishant, MD  lactulose (CHRONULAC) 10 GM/15ML solution Take 20 g by mouth 2 (two) times daily.   Yes [provider]  lisinopril (PRINIVIL,ZESTRIL) 20 MG tablet Take 1 tablet (20 mg total) by mouth daily. 03/11/16 05/10/17 Yes Richarda Overlie, MD  nystatin ointment (MYCOSTATIN) Apply 1 application topically 3 (three) times daily. To lower back   Yes [provider]  OLANZapine (ZYPREXA) 2.5 MG tablet Take 2.5 mg by mouth at bedtime.   Yes [provider]  pravastatin (PRAVACHOL) 20 MG tablet Take 1 tablet (20 mg total) by mouth daily at 6 PM. 03/12/16  Yes Marvel Plan, MD  risperiDONE (RISPERDAL) 0.25 MG tablet Take 1-2 tablets (0.25-0.5 mg total) by mouth 2 (two) times daily. .25 mg am and .50 mg at bedtime Patient taking differently: Take 1 mg by mouth at bedtime.  08/29/13  Yes Osvaldo Shipper, MD  rivastigmine (EXELON) 9.5 mg/24hr Place 9.5 mg onto the skin daily.   Yes [provider]    Physical Exam: Vitals:   05/10/17 1845 05/10/17 1915 05/10/17 1930 05/10/17 1945  BP: (!) 142/68  (!) 147/82 (!) 178/93  Pulse: 66  65 65  Resp: Temp:  98.3 F (36.8 C)    TempSrc:      SpO2: 100%  100% 100%  Weight:      Height:          Constitutional: NAD, calm, appears comfortable Eyes: PERTLA, lids and conjunctivae normal ENMT: Mucous membranes are moist. Posterior pharynx clear of any exudate or lesions.   Neck: normal, supple, no masses, no thyromegaly Respiratory: clear to auscultation bilaterally, no wheezing, no crackles. Normal respiratory  effort.   Cardiovascular: S1 & S2 heard, regular rate and rhythm. No extremity edema. No significant JVD. Abdomen: No distension, no tenderness, soft. Bowel sounds normal.  Musculoskeletal: no clubbing / cyanosis. No joint deformity upper and lower extremities.   Skin: no significant rashes, lesions, ulcers. Poor turgor. Neurologic: No facial asymmetry. PERRL. Patellar DTR normal. Moving all extremities.  Psychiatric: Somnolent. Easily roused.      Labs on Admission: I have personally reviewed following labs and imaging studies  CBC: Recent Labs  Lab 05/10/17 1701 05/10/17 1708  WBC 3.9*  --   NEUTROABS 1.9  --   HGB 11.2* 12.6  HCT 34.8* 37.0  MCV 85.7  --   PLT 207  --    Basic Metabolic Panel: Recent Labs  Lab 05/10/17 1701 05/10/17 1708  NA 141 141  K 4.1 3.9  CL 108 105  CO2 25  --   GLUCOSE 114* 113*  BUN 19 21*  CREATININE 1.03* 0.90  CALCIUM 9.4  --    GFR: Estimated Creatinine Clearance: 47.7 mL/min (by C-G formula based on SCr of 0.9 mg/dL). Liver Function Tests: Recent Labs  Lab 05/10/17 1701  AST 32  ALT 15  ALKPHOS 81  BILITOT 0.3  PROT 7.1  ALBUMIN 3.5   No results for input(s): LIPASE, AMYLASE in the last 168 hours. No results for input(s): AMMONIA in the last 168 hours. Coagulation Profile: Recent Labs  Lab 05/10/17 1701  INR 1.06   Cardiac Enzymes: No results for input(s): CKTOTAL, CKMB, CKMBINDEX, TROPONINI in the last 168 hours. BNP (last 3 results) No results for input(s): PROBNP in the last 8760 hours. HbA1C: No results for input(s): HGBA1C in the last 72 hours. CBG: No results for input(s): GLUCAP in the last 168 hours. Lipid Profile: No results for input(s): CHOL, HDL, LDLCALC, TRIG, CHOLHDL, LDLDIRECT in the last 72 hours. Thyroid Function Tests: No results for input(s): TSH, T4TOTAL, FREET4, T3FREE, THYROIDAB in the last 72 hours. Anemia Panel: No results for input(s): VITAMINB12, FOLATE, FERRITIN, TIBC, IRON, RETICCTPCT  in the last 72 hours. Urine analysis:    Component Value Date/Time   COLORURINE YELLOW 05/10/2017 2205   APPEARANCEUR CLEAR 05/10/2017 2205   LABSPEC 1.045 (H) 05/10/2017 2205   PHURINE 5.0 05/10/2017 2205   GLUCOSEU NEGATIVE 05/10/2017 2205   HGBUR NEGATIVE 05/10/2017 2205   BILIRUBINUR NEGATIVE 05/10/2017 2205   KETONESUR NEGATIVE 05/10/2017 2205   PROTEINUR NEGATIVE 05/10/2017 2205   UROBILINOGEN 1.0 08/27/2013 1112   NITRITE NEGATIVE 05/10/2017 2205   LEUKOCYTESUR NEGATIVE 05/10/2017 2205   Sepsis Labs: (procalcitonin:4,lacticidven:4) )No results found for this or any previous visit (from the past 240 hour(s)).   Radiological Exams on Admission: Ct Angio Head W Or Wo Contrast  Addendum Date: 05/10/2017   ADDENDUM REPORT: 05/10/2017 18:18 ADDENDUM: These results were called by telephone at the time of interpretation on 05/10/2017 at 6:18 pm to Dr. Caryl Pina , who verbally acknowledged these  results. Electronically Signed   By: Mitzi Hansen M.D.   On: 05/10/2017 18:18   Result Date: 05/10/2017 CLINICAL DATA:  84 y/o  F; stroke for follow-up. EXAM: CT ANGIOGRAPHY HEAD AND NECK TECHNIQUE: Multidetector CT imaging of the head and neck was performed using the standard protocol during bolus administration of intravenous contrast. Multiplanar CT image reconstructions and MIPs were obtained to evaluate the vascular anatomy. Carotid stenosis measurements (when applicable) are obtained utilizing NASCET criteria, using the distal internal carotid diameter as the denominator. CONTRAST:  97 cc Isovue 370 COMPARISON:  05/10/2017 CT head. 03/11/2016 CT angiogram head and neck. FINDINGS: CTA NECK FINDINGS Aortic arch: Standard branching. Imaged portion shows no evidence of aneurysm or dissection. No significant stenosis of the major arch vessel origins. Moderate calcific atherosclerosis of the aorta. Right carotid system: No evidence of dissection, stenosis (50% or greater) or  occlusion. Calcified plaque of the right carotid bifurcation with mild less than 50% proximal ICA stenosis. Left carotid system: No evidence of dissection, stenosis (50% or greater) or occlusion. Vertebral arteries: Left dominant. No evidence of dissection, stenosis (50% or greater) or occlusion. Skeleton: Moderate spondylosis of the cervical spine greatest at the C6-7 level. No high-grade bony canal stenosis. Other neck: Left lobe of thyroid nodule measuring up to 14 mm. Upper chest: Right upper lobe pulmonary nodule is decreased in size measuring 9 mm, previously 11 mm (series 9, image 175). Review of the MIP images confirms the above findings CTA HEAD FINDINGS Anterior circulation: Left A2/3 short segment of moderate, increased from prior study (series 13, image 77). No large vessel occlusion, aneurysm, or vascular malformation. Posterior circulation: Right P2 mild stenosis. No large vessel occlusion, aneurysm, or vascular malformation. Venous sinuses: As permitted by contrast timing, patent. Anatomic variants: Small bilateral posterior communicating arteries. No anterior communicating artery identified. Review of the MIP images confirms the above findings IMPRESSION: 1. Patent anterior and posterior intracranial circulation. No large vessel occlusion, aneurysm, or vascular malformation. 2. Patent carotid and vertebral systems. No dissection, aneurysm, or hemodynamically significant stenosis by NASCET criteria. 3. Moderate left A2/3 stenosis, increased from prior study. 4. Right P2 mild stenosis. 5. Right proximal ICA mild less than 50% stenosis with calcified plaque. 6. Right upper lobe pulmonary nodule is decreased in size measuring 9 mm, previously 11 mm. Electronically Signed: By: Mitzi Hansen M.D. On: 05/10/2017 18:03   Ct Angio Neck W And/or Wo Contrast  Addendum Date: 05/10/2017   ADDENDUM REPORT: 05/10/2017 18:18 ADDENDUM: These results were called by telephone at the time of interpretation  on 05/10/2017 at 6:18 pm to Dr. Caryl Pina , who verbally acknowledged these results. Electronically Signed   By: Mitzi Hansen M.D.   On: 05/10/2017 18:18   Result Date: 05/10/2017 CLINICAL DATA:  84 y/o  F; stroke for follow-up. EXAM: CT ANGIOGRAPHY HEAD AND NECK TECHNIQUE: Multidetector CT imaging of the head and neck was performed using the standard protocol during bolus administration of intravenous contrast. Multiplanar CT image reconstructions and MIPs were obtained to evaluate the vascular anatomy. Carotid stenosis measurements (when applicable) are obtained utilizing NASCET criteria, using the distal internal carotid diameter as the denominator. CONTRAST:  97 cc Isovue 370 COMPARISON:  05/10/2017 CT head. 03/11/2016 CT angiogram head and neck. FINDINGS: CTA NECK FINDINGS Aortic arch: Standard branching. Imaged portion shows no evidence of aneurysm or dissection. No significant stenosis of the major arch vessel origins. Moderate calcific atherosclerosis of the aorta. Right carotid system: No evidence of dissection, stenosis (50%  or greater) or occlusion. Calcified plaque of the right carotid bifurcation with mild less than 50% proximal ICA stenosis. Left carotid system: No evidence of dissection, stenosis (50% or greater) or occlusion. Vertebral arteries: Left dominant. No evidence of dissection, stenosis (50% or greater) or occlusion. Skeleton: Moderate spondylosis of the cervical spine greatest at the C6-7 level. No high-grade bony canal stenosis. Other neck: Left lobe of thyroid nodule measuring up to 14 mm. Upper chest: Right upper lobe pulmonary nodule is decreased in size measuring 9 mm, previously 11 mm (series 9, image 175). Review of the MIP images confirms the above findings CTA HEAD FINDINGS Anterior circulation: Left A2/3 short segment of moderate, increased from prior study (series 13, image 77). No large vessel occlusion, aneurysm, or vascular malformation. Posterior circulation:  Right P2 mild stenosis. No large vessel occlusion, aneurysm, or vascular malformation. Venous sinuses: As permitted by contrast timing, patent. Anatomic variants: Small bilateral posterior communicating arteries. No anterior communicating artery identified. Review of the MIP images confirms the above findings IMPRESSION: 1. Patent anterior and posterior intracranial circulation. No large vessel occlusion, aneurysm, or vascular malformation. 2. Patent carotid and vertebral systems. No dissection, aneurysm, or hemodynamically significant stenosis by NASCET criteria. 3. Moderate left A2/3 stenosis, increased from prior study. 4. Right P2 mild stenosis. 5. Right proximal ICA mild less than 50% stenosis with calcified plaque. 6. Right upper lobe pulmonary nodule is decreased in size measuring 9 mm, previously 11 mm. Electronically Signed: By: Mitzi Hansen M.D. On: 05/10/2017 18:03   Dg Chest Port 1 View  Result Date: 05/10/2017 CLINICAL DATA:  UTI. EXAM: PORTABLE CHEST 1 VIEW COMPARISON:  08/27/2013 FINDINGS: 1902 hours. Lungs are hyperexpanded. The lungs are clear without focal pneumonia, edema, pneumothorax or pleural effusion. Calcified granuloma identified right upper lobe. The cardiopericardial silhouette is within normal limits for size. The visualized bony structures of the thorax are intact. Telemetry leads overlie the chest. IMPRESSION: No active disease. Electronically Signed   By: Kennith Center M.D.   On: 05/10/2017 19:51   Ct Head Code Stroke Wo Contrast  Result Date: 05/10/2017 CLINICAL DATA:  Code stroke. Onset of weakness of the BILATERAL legs. Altered mental status. Dementia. Asymmetric RIGHT arm decreased movement. EXAM: CT HEAD WITHOUT CONTRAST TECHNIQUE: Contiguous axial images were obtained from the base of the skull through the vertex without intravenous contrast. COMPARISON:  CT head, MR head, and CTA head neck 03/10/2016, 03/11/2016. FINDINGS: Brain: No evidence for acute  infarction, hemorrhage, mass lesion, hydrocephalus, or extra-axial fluid. Generalized atrophy. Hypoattenuation of white matter, likely small vessel disease. Vascular: Calcification of the cavernous internal carotid arteries consistent with cerebrovascular atherosclerotic disease. No signs of intracranial large vessel occlusion. Skull: Normal. Negative for fracture or focal lesion. Sinuses/Orbits: No acute finding. Other: None. ASPECTS [facility] Stroke Program Early CT Score) - Ganglionic level infarction (caudate, lentiform nuclei, internal capsule, insula, M1-M3 cortex): 7 - Supraganglionic infarction (M4-M6 cortex): 3 Total score (0-10 with 10 being normal): 10 IMPRESSION: 1. Atrophy and small vessel disease. No acute intracranial abnormality. 2. ASPECTS is 10. These results were communicated to Dr. Otelia Limes at 5:26 pmon 5/9/2019by text page via the [facility] messaging system. Electronically Signed   By: Elsie Stain M.D.   On: 05/10/2017 17:32    EKG: Independently reviewed. Sinus rhythm.   Assessment/Plan   1. Acute encephalopathy  - Presents from SNF with increased confusion and combativeness with possible left gaze deviation  - Presented as code stroke, but neurology feels delirium more likely  -  Head CT is negative for acute findings and CTA H&N negative for LVO or significant stenosis  - Continue cardiac monitoring with frequent neuro checks, PT/OT/SLP evals, check EEG, MRI brain, MRA head, continue ASA and statin    2. Frontotemporal dementia  - Continue Exelon, d/c Namenda per neuro recs - Continue antipsychotics for associated behavioral disturbance    3. Hypertension  - BP modestly elevated in ED  - Continue lisinopril and use hydralazine IVP's prn   4. Hyperlipidemia  - Continue statin     DVT prophylaxis: Lovenox  Code Status: Full  Family Communication: Discussed with patient Consults called: Neurology Admission status: Observation    Briscoe Deutscher, MD [facility] Pager [phone]  If 7PM-7AM, please contact night-coverage [email or web address] Password [email or web address]  05/11/2017, 1:03 AM

## 2017-05-11 NOTE — Progress Notes (Signed)
Patient arrived to unit, unable to do admission assessment as patient is demented , drowsy and not following commands.

## 2017-05-11 NOTE — Evaluation (Signed)
Speech Language Pathology Evaluation Patient Details Name: Marissa Chung MRN: [id number] DOB: [birth year] Today's Date: 05/11/2017 Time: 4098-1191 SLP Time Calculation (min) (ACUTE ONLY): 12 min  Problem List:  Patient Active Problem List   Diagnosis Date Noted  . Acute encephalopathy 08/27/2013  . Confusion 08/27/2013  . Accelerated hypertension 07/26/2013  . Frontotemporal dementia 07/26/2013  . Anemia 07/26/2013  . Dyslipidemia 07/26/2013   Past Medical History:  Past Medical History:  Diagnosis Date  . Cataract    BILATERAL  . Frontal lobe dementia   . History of shingles   . Hypertension    Past Surgical History:  Past Surgical History:  Procedure Laterality Date  . FINGER SURGERY Right    pinky finger   HPI:  84 year old female who presented with altered mentation from the skilled nursing facility.  She does have a significant past medical history of frontotemporal dementia, hypertension and history of TIAs. Current working diagnosis is metabolic encephalopathy, to rule out acute cerebrovascular accident.    Assessment / Plan / Recommendation Clinical Impression  Entered room after OT departed- pt again attempting to climb out of bed, with legs over siderails.  Pt extremely confused and difficult to redirect. Talkative; fluent output with paraphasias and perseverations consistent with frontotemporal dementia.  No ability to focus attention despite max cues.  Followed simple commands and stated her name and DOB; otherwise disoriented. Poor naming.  NT arrived with floor mats and RN arranging for sitter; activity belt ordered by Diplomatic Services operational officer.  No acute SLP f/u recommended at this time - recommend evaluation at SNF level once pt returns, to determine needs in familiar environment.    SLP Assessment  SLP Recommendation/Assessment: All further Speech Lanaguage Pathology  needs can be addressed in the next venue of care SLP Visit Diagnosis: Cognitive communication deficit  (R41.841)    Follow Up Recommendations  Skilled Nursing facility    Frequency and Duration           SLP Evaluation Cognition  Overall Cognitive Status: No family/caregiver present to determine baseline cognitive functioning Arousal/Alertness: Awake/alert Orientation Level: Disoriented X4 Attention: Focused Focused Attention: Impaired Focused Attention Impairment: Verbal basic;Functional basic Memory: Impaired Memory Impairment: Storage deficit Awareness: Impaired Awareness Impairment: Intellectual impairment Problem Solving: Impaired Problem Solving Impairment: Verbal basic Behaviors: Restless;Impulsive;Perseveration;Poor frustration tolerance Safety/Judgment: Impaired       Comprehension  Auditory Comprehension Overall Auditory Comprehension: Impaired Reading Comprehension Reading Status: Impaired    Expression Expression Primary Mode of Expression: Verbal Verbal Expression Overall Verbal Expression: Impaired Initiation: No impairment Repetition: Impaired Naming: Impairment Written Expression Dominant Hand: Right Written Expression: Not tested   Oral / Motor  Oral Motor/Sensory Function Overall Oral Motor/Sensory Function: Within functional limits Motor Speech Overall Motor Speech: Appears within functional limits for tasks assessed   GO                    Marissa Chung Marissa Chung 05/11/2017, 4:45 PM

## 2017-05-11 NOTE — Care Management Obs Status (Signed)
MEDICARE OBSERVATION STATUS NOTIFICATION   Patient Details  Name: Marissa Chung MRN: [id number] Date of Birth: 1933-03-12   Medicare Observation Status Notification Given:  Yes    Yancey Flemings, RN 05/11/2017, 11:04 AM

## 2017-05-11 NOTE — Progress Notes (Signed)
Occupational Therapy Evaluation Patient Details Name: Marissa Chung MRN: [id number] DOB: 10/14/1933 Today's Date: 05/11/2017    History of Present Illness 84 year old female who presented with altered mentation from the skilled nursing facility.  She does have the significant past medical history of frontotemporal dementia, hypertension and history of TIAs. She was noted to have a sudden change in her mentation, left gaze preference, worsening confusion and combativeness.    Clinical Impression   Entered room with bed alarm sounding and pt attempting to get OOB. Pt oriented to self only and apparently having visual hallucinations, talking to the tele-monitor who she was referring to as Gelene Mink. Attempted unsuccessfully  to contact her niece regarding her PLOF.  [facility] listed on Facesheet but they state pt no longer resides at [facility] facilities.  Per admitting MD note, pt was able to feed herself and walk around at her facility. Eval limited due to pt participation secondary to apparent confusion. Recommend pt return to familiar SNF level of care. All further OT to be deferred to SNF. Please contact OT @ [phone] if additional information is needed. Thanks    Follow Up Recommendations  SNF    Equipment Recommendations  None recommended by OT    Recommendations for Other Services       Precautions / Restrictions Precautions Precautions: Fall Restrictions Weight Bearing Restrictions: No      Mobility Bed Mobility Overal bed mobility: Needs Assistance Bed Mobility: Supine to Sit;Sit to Supine     Supine to sit: Supervision Sit to supine: Mod assist   General bed mobility comments: Assist needed due to conitive deficits  Transfers Overall transfer level: Needs assistance   Transfers: Sit to/from Stand Sit to Stand: Min assist         General transfer comment: unsteady    Balance Overall balance assessment: Needs assistance Sitting-balance support: Feet  supported Sitting balance-Leahy Scale: Fair       Standing balance-Leahy Scale: Poor                             ADL either performed or assessed with clinical judgement   ADL Overall ADL's : Needs assistance/impaired Eating/Feeding: Supervision/ safety Eating/Feeding Details (indicate cue type and reason): cues for completion; unaware of food being on her and in bed Grooming: Moderate assistance Grooming Details (indicate cue type and reason): max vc; difficulty maintaining attention to task Upper Body Bathing: Maximal assistance   Lower Body Bathing: Maximal assistance;Sit to/from stand   Upper Body Dressing : Maximal assistance;Sitting   Lower Body Dressing: Maximal assistance;Sit to/from stand               Functional mobility during ADLs: Minimal assistance General ADL Comments: Pt sitting with 1 hip on EOB with bed alarm sounding. Completed sit - stand and required mod A to lift both legs back onto bed due to apparent cognitive deficits. Pt pulling her gown off. Pt returned to supine and covered. Restlessness improved. Left room and returned less than 5 min later with bed alarm sounding due to pt trying to get OOB again.      Vision   Additional Comments: unsure of baseline     Perception Perception Comments: appears to be having visual hallucinations   Praxis Praxis Praxis tested?: Deficits Deficits: Initiation;Organization    Pertinent Vitals/Pain Pain Assessment: Faces Faces Pain Scale: No hurt     Hand Dominance Right   Extremity/Trunk Assessment Upper Extremity Assessment Upper Extremity  Assessment: Overall WFL for tasks assessed(using BUE for functional tasks and bed mobility)   Lower Extremity Assessment Lower Extremity Assessment: Defer to PT evaluation   Cervical / Trunk Assessment Cervical / Trunk Assessment: Normal   Communication Communication Communication: HOH   Cognition Arousal/Alertness: Awake/alert Behavior During  Therapy: Restless                                       General Comments       Exercises     Shoulder Instructions      Home Living Family/patient expects to be discharged to:: Skilled nursing facility                                        Prior Functioning/Environment Level of Independence: Needs assistance                 OT Problem List: Decreased cognition;Decreased safety awareness      OT Treatment/Interventions:      OT Goals(Current goals can be found in the care plan section) Acute Rehab OT Goals Patient Stated Goal: unable to state OT Goal Formulation: Patient unable to participate in goal setting  OT Frequency:     Barriers to D/C:            Co-evaluation              AM-PAC PT "6 Clicks" Daily Activity     Outcome Measure Help from another person eating meals?: A Little Help from another person taking care of personal grooming?: A Lot Help from another person toileting, which includes using toliet, bedpan, or urinal?: A Lot Help from another person bathing (including washing, rinsing, drying)?: A Lot Help from another person to put on and taking off regular upper body clothing?: A Lot Help from another person to put on and taking off regular lower body clothing?: A Lot 6 Click Score: 13   End of Session Nurse Communication: Mobility status;Other (comment)(need for safety sitter; low bed; floor mats)  Activity Tolerance: Other (comment)(limited by confusion) Patient left: in bed;with call bell/phone within reach;with bed alarm set  OT Visit Diagnosis: Other symptoms and signs involving cognitive function;Unsteadiness on feet (R26.81)                Time: 2130-8657 OT Time Calculation (min): 19 min Charges:  OT General Charges $OT Visit: 1 Visit OT Evaluation $OT Eval Low Complexity: 1 Low G-Codes:     .  [facility] Rigo Letts, OT/L  [phone] 05/11/2017 05/11/2017, 3:38 PM

## 2017-05-11 NOTE — Progress Notes (Addendum)
Subjective: Patient sleeping comfortably when I walked in the room. Initially patient was falling asleep but if continually stimulated she would stay awake.  She does not know where she is, she does not know the date.  All of her answers are "I do not know".  She is able to show me her thumb but when asked to look left and right she does not.  Exam: Vitals:   05/11/17 0500 05/11/17 0600  BP: (!) 155/71 (!) 152/134  Pulse:    Resp: 15 19  Temp:    SpO2:      Physical Exam   HEENT-  Normocephalic, no lesions, without obvious abnormality.  Normal external eye and conjunctiva.   Extremities- Warm, dry and intact Musculoskeletal-no joint tenderness, deformity or swelling Skin-warm and dry, no hyperpigmentation, vitiligo, or suspicious lesions  Neuro:  Mental Status: Patient is awake she is not oriented.  She answers quickly with the answer "I do not know".  She is quick to state "ouch" when I gave her noxious stimuli.  She continues to have poor attention.  Did not show any significant eye deviation during the exam today in fact she was midline and would not look left or right. Cranial Nerves: II: Blinks to threat bilaterally III,IV, VI:'s are intact however when I asked her to track a pen or even moved in the room patient did not track me.  She would gaze straight forward.  Patient was able to close her eyes tightly. V,VII: Face appears symmetric VIII: hearing intact to voice Motor: Moving all extremities antigravity with approximate 4/5 strength. Sensory: Reacts to noxious stimuli bilaterally Deep Tendon Reflexes: No ankle jerk but does have depressed reflexes in the upper extremities and knee jerks Plantars: Right: downgoing   Left: downgoing Cerebellar: Not cooperative with finger-to-nose or heel-to-shin Gait: Not tested    Medications:  Scheduled: .  stroke: mapping our early stages of recovery book   Does not apply Once  . aspirin EC  325 mg Oral Daily  . enoxaparin  (LOVENOX) injection  40 mg Subcutaneous Q24H  . feeding supplement (ENSURE ENLIVE)  237 mL Oral BID BM  . lactulose  20 g Oral BID  . lisinopril  20 mg Oral Daily  . nystatin ointment  1 application Topical TID  . OLANZapine  2.5 mg Oral QHS  . pravastatin  20 mg Oral q1800  . risperiDONE  0.25 mg Oral Daily  . risperiDONE  0.5 mg Oral QHS  . rivastigmine  9.5 mg Transdermal Daily   Continuous:   Pertinent Labs/Diagnostics: MRI and EEG pending  Ct Angio Head W Or Wo Contrast Addendum Date: 05/10/2017   ADDENDUM REPORT: 05/10/2017 18:18 ADDENDUM: These results were called by telephone at the time of interpretation on 05/10/2017 at 6:18 pm to Dr. Caryl Pina , who verbally acknowledged these results. Electronically Signed   By: Mitzi Hansen M.D.   On: 05/10/2017 18:18  Result Date: 05/10/2017 IMPRESSION: 1. Patent anterior and posterior intracranial circulation. No large vessel occlusion, aneurysm, or vascular malformation. 2. Patent carotid and vertebral systems. No dissection, aneurysm, or hemodynamically significant stenosis by NASCET criteria. 3. Moderate left A2/3 stenosis, increased from prior study. 4. Right P2 mild stenosis. 5. Right proximal ICA mild less than 50% stenosis with calcified plaque. 6. Right upper lobe pulmonary nodule is decreased in size measuring 9 mm, previously 11 mm. Electronically Signed: By: Mitzi Hansen M.D. On: 05/10/2017 18:03   Dg Chest Port 1 View Result Date: 05/10/2017  IMPRESSION: No active disease. Electronically Signed   By: Kennith Center M.D.   On: 05/10/2017 19:51   Ct Head Code Stroke Wo Contrast Result Date: 05/10/2017 IMPRESSION: 1. Atrophy and small vessel disease. No acute intracranial abnormality. 2. ASPECTS is 10. These results were communicated to Dr. Otelia Limes at 5:26 pmon 5/9/2019by text page via the [facility] messaging system. Electronically Signed   By: Elsie Stain M.D.   On: 05/10/2017 17:32    Felicie Morn PA-C [facility] [phone]  Impression: 84 year old female with acute confusion.  Again as stated previously, it looks like her overall presentation is most consistent with a hypoactive delirium.  Today she does not show any gaze deviation but stares straight forward.  Patient's speech pattern is most consistent with confusion and at times repetitive/perseverating.  Recommendations: -EEG completed with report pending  --MRI pending -Continue aspirin -Continue telemetry -Continue frequent neuro checks -Continue Exelon-continue with PT/OT  Electronically signed: Dr. Caryl Pina 05/11/2017, 11:19 AM

## 2017-05-11 NOTE — Progress Notes (Signed)
PT Cancellation Note  Patient Details Name: Marissa Chung MRN: [id number] DOB: 1933/08/22   Cancelled Treatment:    Reason Eval/Treat Not Completed: Patient at procedure or test/unavailable, will follow.    [name] 05/11/2017, 9:24 AM [phone]

## 2017-05-11 NOTE — Progress Notes (Signed)
EEG Completed; Results Pending  

## 2017-05-11 NOTE — Progress Notes (Signed)
In and out cath done. 400 ml of clear urine output noted.

## 2017-05-11 NOTE — Progress Notes (Signed)
PT Cancellation Note  Patient Details Name: Marissa Chung MRN: [id number] DOB: [birth year]   Cancelled Treatment:    Reason Eval/Treat Not Completed: Other (comment)(pt sleeping upon my arrival, aroused with verbal stimulii but refused PT. Stated she wanted to sleep despite encouragement to participate in PT. Pt oriented to self only. Will follow. )   Tamala Ser 05/11/2017, 11:49 AM [phone]

## 2017-05-12 ENCOUNTER — Inpatient Hospital Stay (HOSPITAL_COMMUNITY): Payer: Medicare Other

## 2017-05-12 DIAGNOSIS — G9341 Metabolic encephalopathy: Secondary | ICD-10-CM | POA: Diagnosis present

## 2017-05-12 DIAGNOSIS — B999 Unspecified infectious disease: Secondary | ICD-10-CM | POA: Diagnosis present

## 2017-05-12 DIAGNOSIS — Z7982 Long term (current) use of aspirin: Secondary | ICD-10-CM | POA: Diagnosis not present

## 2017-05-12 DIAGNOSIS — G9340 Encephalopathy, unspecified: Secondary | ICD-10-CM | POA: Diagnosis present

## 2017-05-12 DIAGNOSIS — R638 Other symptoms and signs concerning food and fluid intake: Secondary | ICD-10-CM | POA: Diagnosis not present

## 2017-05-12 DIAGNOSIS — E785 Hyperlipidemia, unspecified: Secondary | ICD-10-CM

## 2017-05-12 DIAGNOSIS — I10 Essential (primary) hypertension: Secondary | ICD-10-CM | POA: Diagnosis not present

## 2017-05-12 DIAGNOSIS — Z79899 Other long term (current) drug therapy: Secondary | ICD-10-CM | POA: Diagnosis not present

## 2017-05-12 DIAGNOSIS — E876 Hypokalemia: Secondary | ICD-10-CM | POA: Diagnosis not present

## 2017-05-12 DIAGNOSIS — E860 Dehydration: Secondary | ICD-10-CM | POA: Diagnosis not present

## 2017-05-12 DIAGNOSIS — R338 Other retention of urine: Secondary | ICD-10-CM | POA: Diagnosis not present

## 2017-05-12 DIAGNOSIS — Z91013 Allergy to seafood: Secondary | ICD-10-CM | POA: Diagnosis not present

## 2017-05-12 DIAGNOSIS — Z7189 Other specified counseling: Secondary | ICD-10-CM | POA: Diagnosis not present

## 2017-05-12 DIAGNOSIS — Z8673 Personal history of transient ischemic attack (TIA), and cerebral infarction without residual deficits: Secondary | ICD-10-CM | POA: Diagnosis not present

## 2017-05-12 DIAGNOSIS — Z515 Encounter for palliative care: Secondary | ICD-10-CM | POA: Diagnosis present

## 2017-05-12 DIAGNOSIS — E878 Other disorders of electrolyte and fluid balance, not elsewhere classified: Secondary | ICD-10-CM | POA: Diagnosis not present

## 2017-05-12 DIAGNOSIS — I959 Hypotension, unspecified: Secondary | ICD-10-CM | POA: Diagnosis present

## 2017-05-12 DIAGNOSIS — Z87891 Personal history of nicotine dependence: Secondary | ICD-10-CM | POA: Diagnosis not present

## 2017-05-12 DIAGNOSIS — F0280 Dementia in other diseases classified elsewhere without behavioral disturbance: Secondary | ICD-10-CM | POA: Diagnosis not present

## 2017-05-12 DIAGNOSIS — G3109 Other frontotemporal dementia: Secondary | ICD-10-CM | POA: Diagnosis present

## 2017-05-12 LAB — CBC WITH DIFFERENTIAL/PLATELET
BASOS ABS: 0 10*3/uL (ref 0.0–0.1)
BASOS PCT: 0 %
EOS ABS: 0.1 10*3/uL (ref 0.0–0.7)
Eosinophils Relative: 5 %
HCT: 31.8 % — ABNORMAL LOW (ref 36.0–46.0)
HEMOGLOBIN: 10.3 g/dL — AB (ref 12.0–15.0)
Lymphocytes Relative: 30 %
Lymphs Abs: 0.9 10*3/uL (ref 0.7–4.0)
MCH: 27.6 pg (ref 26.0–34.0)
MCHC: 32.4 g/dL (ref 30.0–36.0)
MCV: 85.3 fL (ref 78.0–100.0)
MONOS PCT: 10 %
Monocytes Absolute: 0.3 10*3/uL (ref 0.1–1.0)
NEUTROS PCT: 55 %
Neutro Abs: 1.6 10*3/uL (ref 1.7–7.7)
Platelets: 164 10*3/uL (ref 150–400)
RBC: 3.73 MIL/uL — ABNORMAL LOW (ref 3.87–5.11)
RDW: 15.1 % (ref 11.5–15.5)
WBC: 2.9 10*3/uL — ABNORMAL LOW (ref 4.0–10.5)

## 2017-05-12 LAB — BASIC METABOLIC PANEL
ANION GAP: 6 (ref 5–15)
BUN: 10 mg/dL (ref 6–20)
CALCIUM: 8.9 mg/dL (ref 8.9–10.3)
CHLORIDE: 111 mmol/L (ref 101–111)
CO2: 26 mmol/L (ref 22–32)
CREATININE: 0.79 mg/dL (ref 0.44–1.00)
GFR calc Af Amer: >60 mL/min (ref >60)
GFR calc non Af Amer: >60 mL/min (ref >60)
Glucose, Bld: 123 mg/dL — ABNORMAL HIGH (ref 65–99)
Potassium: 3.1 mmol/L — ABNORMAL LOW (ref 3.5–5.1)
SODIUM: 143 mmol/L (ref 135–145)

## 2017-05-12 LAB — MRSA PCR SCREENING: MRSA BY PCR: NEGATIVE

## 2017-05-12 MED ORDER — POTASSIUM CHLORIDE 20 MEQ PO PACK
40.0000 meq | PACK | Freq: Once | ORAL | Status: AC
  Administered 2017-05-12: 40 meq via ORAL
  Filled 2017-05-12: qty 2

## 2017-05-12 MED ORDER — POTASSIUM CHLORIDE 10 MEQ/100ML IV SOLN
10.0000 meq | INTRAVENOUS | Status: AC
  Administered 2017-05-12 (×4): 10 meq via INTRAVENOUS
  Filled 2017-05-12 (×4): qty 100

## 2017-05-12 NOTE — Progress Notes (Signed)
Noted pt did not void  Bladder scan done and noted  730 ml on scan. Order for I in done and 750 mls of clear yellow urine noted.

## 2017-05-12 NOTE — Progress Notes (Signed)
Pt  BP  dropped  after Hydralazine 10 mg given for S BP 174/141  And continued to drop until 343 BP87/48.  At 0343. Pt on iv fluid D5wns at 50 ml/hr. MD on call notified no order given.  RN continue to monitor. VS with neuro Check.  Now BP 10567 MAP 80. Pt resting in bed no acute change in neuro status. Pt less agitated and restless. Will continue to monitor.

## 2017-05-12 NOTE — Progress Notes (Signed)
PROGRESS NOTE    Marissa Chung  ZOX:[id number] DOB: 17-Dec-1933 DOA: 05/10/2017 PCP: System, Pcp Not In    Brief Narrative:  84 year old female who presented with altered mentation from the skilled nursing facility.  She does have the significant past medical history of frontotemporal dementia, hypertension and history of TIAs. She was noted to have a sudden change in her mentation, left gaze preference, worsening confusion and combativeness.  Apparently at baseline she is able to independently ambulate and feed herself.  On the initial physical examination blood pressure 142/68, heart rate 66, respiratory rate 18, temperature 98.3, oxygen saturation 100%.  Moist mucous membranes, lungs are clear to auscultation bilaterally, heart S1-S2 present and rhythmic, the abdomen was soft nontender, no lower extremity edema, patient was somnolent but nonfocal.  Sodium 141, potassium 4.1, chloride 108, bicarb 25, glucose 114, BUN 19, creatinine 1.0, white count 3.9, hemoglobin 9.2, hematocrit 34.8, platelets 207.  Urinalysis with specific gravity 1.045, negative protein.  Head CT with no acute changes.  Chest x-ray with left rotation, no infiltrates.  EKG sinus rhythm, normal axis, normal intervals, poor R wave progression.  Patient was admitted to the hospital with a working diagnosis of metabolic encephalopathy, to rule out acute cerebrovascular accident.    Assessment & Plan:   Principal Problem:   Acute encephalopathy Active Problems:   Accelerated hypertension   Frontotemporal dementia   1. Metabolic encephalopathy in the setting of frontotemporal dementia. Continue with thiamine and IV dextrose. Patient continue with episodic agitation and fluctuation in mentation, features of delirium, will continue with olanzapine at night, risperiodone and rivastigmine. As needed lorazepam and haldol. EEG with no active seizure, will follow on brain MRI. Continue nutritional supplements.   2. HTN. On  lisinopril and as needed IV hydralazine. Patient had episodes of hypotension this am with systolic blood pressure down to 99, will hold on Ace inh for now.   3. New Hypokalemia. Serum K down to 3.0, will continue repletion with IV kcl and po kcl, total of 80 meq, will follow renal panel in am, continue gentle hydration with d5NS.   4. Dyslipidemia. Continue pravastatin.    DVT prophylaxis: enoxaparin   Code Status: full Family Communication: no family at the bedside  Disposition Plan: change to inpatient.    Consultants:   Neurology   Procedures:     Antimicrobials:       Subjective: Patient continue to have agitation, required lorazepam. Not following commands, able to tolerate po. Fluctuating mentation.   Objective: Vitals:   05/12/17 0629 05/12/17 0706 05/12/17 0743 05/12/17 0754  BP: 105/67 (!) 97/51 121/61   Pulse:  76 74   Resp:  15 17   Temp:    97.8 F (36.6 C)  TempSrc:    Oral  SpO2:  100% 100%   Weight:      Height:        Intake/Output Summary (Last 24 hours) at 05/12/2017 1027 Last data filed at 05/12/2017 0900 Gross per 24 hour  Intake 982.5 ml  Output 750 ml  Net 232.5 ml   Filed Weights   05/10/17 1841 05/11/17 0200  Weight: 65.8 kg (145 lb) 60.7 kg (133 lb 13.1 oz)    Examination:   General: deconditioned  Neurology: somnolent, only localized pain.  E ENT: no pallor, no icterus, oral mucosa moist Cardiovascular: No JVD. S1-S2 present, rhythmic, no gallops, rubs, or murmurs. No lower extremity edema. Pulmonary: vesicular breath sounds bilaterally, adequate air movement, no wheezing, rhonchi or rales.  Gastrointestinal. Abdomen with no organomegaly, non tender, no rebound or guarding Skin. No rashes Musculoskeletal: no joint deformities     Data Reviewed: I have personally reviewed following labs and imaging studies  CBC: Recent Labs  Lab 05/10/17 1701 05/10/17 1708 05/12/17 0347  WBC 3.9*  --  2.9*  NEUTROABS 1.9  --   1.6  HGB 11.2* 12.6 10.3*  HCT 34.8* 37.0 31.8*  MCV 85.7  --  85.3  PLT 207  --  164   Basic Metabolic Panel: Recent Labs  Lab 05/10/17 1701 05/10/17 1708 05/12/17 0347  NA 141 141 143  K 4.1 3.9 3.1*  CL 108 105 111  CO2 25  --  26  GLUCOSE 114* 113* 123*  BUN 19 21* 10  CREATININE 1.03* 0.90 0.79  CALCIUM 9.4  --  8.9   GFR: Estimated Creatinine Clearance: 52.8 mL/min (by C-G formula based on SCr of 0.79 mg/dL). Liver Function Tests: Recent Labs  Lab 05/10/17 1701  AST 32  ALT 15  ALKPHOS 81  BILITOT 0.3  PROT 7.1  ALBUMIN 3.5   No results for input(s): LIPASE, AMYLASE in the last 168 hours. No results for input(s): AMMONIA in the last 168 hours. Coagulation Profile: Recent Labs  Lab 05/10/17 1701  INR 1.06   Cardiac Enzymes: No results for input(s): CKTOTAL, CKMB, CKMBINDEX, TROPONINI in the last 168 hours. BNP (last 3 results) No results for input(s): PROBNP in the last 8760 hours. HbA1C: No results for input(s): HGBA1C in the last 72 hours. CBG: Recent Labs  Lab 05/10/17 1700  GLUCAP 106*   Lipid Profile: No results for input(s): CHOL, HDL, LDLCALC, TRIG, CHOLHDL, LDLDIRECT in the last 72 hours. Thyroid Function Tests: No results for input(s): TSH, T4TOTAL, FREET4, T3FREE, THYROIDAB in the last 72 hours. Anemia Panel: No results for input(s): VITAMINB12, FOLATE, FERRITIN, TIBC, IRON, RETICCTPCT in the last 72 hours.    Radiology Studies: I have reviewed all of the imaging during this hospital visit personally     Scheduled Meds: .  stroke: mapping our early stages of recovery book   Does not apply Once  . aspirin EC  325 mg Oral Daily  . enoxaparin (LOVENOX) injection  40 mg Subcutaneous Q24H  . feeding supplement (ENSURE ENLIVE)  237 mL Oral BID BM  . lactulose  20 g Oral BID  . lisinopril  20 mg Oral Daily  . multivitamin  1 tablet Oral Daily  . nystatin ointment  1 application Topical TID  . OLANZapine  2.5 mg Oral QHS  .  pravastatin  20 mg Oral q1800  . risperiDONE  0.25 mg Oral Daily  . risperiDONE  0.5 mg Oral QHS  . rivastigmine  9.5 mg Transdermal Daily  . thiamine  100 mg Oral Daily   Continuous Infusions: . dextrose 5 % and 0.9% NaCl 50 mL/hr at 05/12/17 0816     LOS: 0 days        Mauricio Annett Gula, MD [facility] Pager [phone]

## 2017-05-12 NOTE — Progress Notes (Signed)
Bladder scan done=162ml in bladder.

## 2017-05-12 NOTE — Progress Notes (Signed)
PT Cancellation Note  Patient Details Name: Marissa Chung MRN: [id number] DOB: 09-16-1933   Cancelled Treatment:    Reason Eval/Treat Not Completed: Fatigue/lethargy limiting ability to participate. PT eval attempted in AM and PM. Pt sleeping heavily and unable to arouse. PT to re-attempt tomorrow.   Ilda Foil 05/12/2017, 1:38 PM

## 2017-05-12 NOTE — Plan of Care (Signed)
Pt able to assist with feeding self.

## 2017-05-12 NOTE — Progress Notes (Signed)
EEG completed yesterday:  Description of the EEG recording: When awake posterior dominant rhythm is 6-8 Hz symmetrical reactive  Occasional intermittent generalized delta activity. Photic stimulation did not produce any abnormal response. Non-REM stage II sleep was not obtained. No epileptiform features were seen during this recording. Impression: The EEG is abnormal and findings are suggestive mild generalized cerebral dysfunction. No epileptiform features were seen during this recording.  (Discussed EEG with Dr. Desmond Lope. The official report states "Epileptiform discharges...", which is a typo. The report should state "No epileptiform discharges..." Dr. Desmond Lope will correct the report.)  MRI is pending.   No change to yesterday's assessment and plan.   Electronically signed: Dr. Caryl Pina

## 2017-05-12 NOTE — Procedures (Addendum)
Date of recording 05/10/2017  Referring physician Dr. Antionette Char  Reason for the study Altered mental status  Technical Digital EEG recording using 10-20 electrode international system.  Description of the recording When awake posterior dominant rhythm is 6-8 Hz symmetrical reactive  Occasional intermittent generalized delta activity Photic stimulation did not produce any abnormal response. Non-REM stage II sleep was not obtained  Epileptiform features were not seen during this recording.  Impression The EEG is abnormal and findings are suggestive mild generalized cerebral dysfunction. Epileptiform features were not seen during this recording.

## 2017-05-13 DIAGNOSIS — G9341 Metabolic encephalopathy: Secondary | ICD-10-CM

## 2017-05-13 LAB — BASIC METABOLIC PANEL
Anion gap: 6 (ref 5–15)
BUN: 13 mg/dL (ref 6–20)
CALCIUM: 8.8 mg/dL — AB (ref 8.9–10.3)
CO2: 24 mmol/L (ref 22–32)
CREATININE: 0.84 mg/dL (ref 0.44–1.00)
Chloride: 113 mmol/L — ABNORMAL HIGH (ref 101–111)
Glucose, Bld: 94 mg/dL (ref 65–99)
Potassium: 4.1 mmol/L (ref 3.5–5.1)
SODIUM: 143 mmol/L (ref 135–145)

## 2017-05-13 MED ORDER — HALOPERIDOL LACTATE 5 MG/ML IJ SOLN
1.0000 mg | INTRAMUSCULAR | Status: DC | PRN
  Administered 2017-05-15: 1 mg via INTRAMUSCULAR
  Filled 2017-05-13: qty 1

## 2017-05-13 MED ORDER — LORAZEPAM 2 MG/ML IJ SOLN: 0.5000 mg | Freq: Four times a day (QID) | INTRAMUSCULAR | Status: DC | PRN

## 2017-05-13 MED ORDER — DEXTROSE-NACL 5-0.45 % IV SOLN
INTRAVENOUS | Status: DC
  Administered 2017-05-13 – 2017-05-16 (×4): via INTRAVENOUS

## 2017-05-13 MED ORDER — ASPIRIN EC 81 MG PO TBEC
81.0000 mg | DELAYED_RELEASE_TABLET | Freq: Every day | ORAL | Status: DC
  Administered 2017-05-14 – 2017-05-18 (×5): 81 mg via ORAL
  Filled 2017-05-13 (×5): qty 1

## 2017-05-13 NOTE — Progress Notes (Addendum)
PT Cancellation Note  Patient Details Name: Marissa Chung MRN: [id number] DOB: 1933-01-06   Cancelled Treatment:    Reason Eval/Treat Not Completed: Fatigue/lethargy limiting ability to participate; remains lethargic, not opening eyes to stimuli or opening mouth for food with RN. Will follow-up for PT evaluation as pt able to participate.  Ina Homes, PT, DPT Acute Rehab Services  Pager: [phone]  Malachy Chamber 05/13/2017, 10:54 AM

## 2017-05-13 NOTE — Progress Notes (Signed)
MRI completed early this AM:   MRI HEAD: 1. No acute intracranial process. 2. Stable examination including moderate chronic small vessel ischemic disease.  MRA HEAD: 1. No emergent large vessel occlusion on this moderately motion degraded examination. 2. Stable moderate to severe stenosis LEFT ACA. Moderate stenosis bilateral PCA. 3. Stable 2 mm infundibulum versus aneurysm LEFT cavernous ICA.  EEG showed no epileptiform discharges.   BP (!) 148/65 (BP Location: Right Leg)   Pulse 70   Temp 98 F (36.7 C) (Axillary)   Resp 19   Ht  (1.702 m)   Wt 60.7 kg (133 lb 13.1 oz)   SpO2 95%   BMI 20.96 kg/m    Impression: 84 year old female with acute confusion. MRI shows no acute stroke and EEG was negative for electrographic seizures or epileptiform discharges. Again as stated previously, it looks like her overall presentation is most consistent with a hypoactive delirium. Of note, she has a history of frontotemporal dementia, which would tend to predispose to delirium in the setting of relatively mild metabolic, toxic or infectious precipitating factors.   Recommendations: -Continue aspirin -Continue telemetry -Continue frequent neuro checks -Continue Exelon -continue with PT/OT --Continue thiamine.  --Maintain diurnal cycle with lights on during day, lights off with TV off in quiet room at night. OOB to chair QD.  --Neurology will sign off. Please call if there are additional questions.   Electronically signed: Dr. Caryl Pina

## 2017-05-13 NOTE — Progress Notes (Signed)
PT was not able to void on her own for the past 24 hrs.  Bladder scan at 2300 05/12/17 showed 420 ml, pt was in/out cath and 600 ml urine emptied.   No voiding this am   .   Bladder scan done. 190 in bladder at 0600. Also pt slept through out the night. Able to arouse but does not follow command. MRI done.Marland Kitchen

## 2017-05-13 NOTE — Progress Notes (Addendum)
PROGRESS NOTE    Marissa Chung  WUJ:[id number] DOB: 15-Nov-1933 DOA: 05/10/2017 PCP: System, Pcp Not In    Brief Narrative:  84 year old female who presented with altered mentation from the skilled nursing facility. She does have the significant past medical history of frontotemporal dementia, hypertension and history of TIAs. She was noted to have a sudden change in her mentation, left gaze preference, worsening confusionandcombativeness.Apparently at baseline she is able to independently ambulate and feed herself. On the initial physical examination blood pressure 142/68, heart rate 66, respiratory rate 18, temperature 98.3, oxygen saturation 100%. Moist mucous membranes, lungs are clear to auscultation bilaterally, heart S1-S2 present and rhythmic, the abdomen was soft nontender, no lower extremity edema, patient was somnolent but nonfocal.Sodium 141, potassium 4.1, chloride 108, bicarb 25, glucose 114, BUN 19, creatinine 1.0,white count 3.9, hemoglobin 9.2, hematocrit 34.8, platelets 207.Urinalysis with specific gravity 1.045, negative protein.Head CT with no acute changes. Chest x-ray with left rotation, no infiltrates.EKG sinus rhythm, normal axis, normal intervals, poor R wave progression.  Patient was admitted to the hospital with a working diagnosis of metabolic encephalopathy, to rule out acute cerebrovascular accident.  Assessment & Plan:   Principal Problem:   Acute encephalopathy Active Problems:   Accelerated hypertension   Frontotemporal dementia   Encephalopathy   1. Metabolic encephalopathy with delirium in the setting of frontotemporal dementia. Severe episodes of agitation requiring anxiolytics. Early this am patient had lorazepam IV 1 mg. This am very somnolent and poorly reactive. Her po intake has been very poor since admission. Will continue olanzapine at night, risperiodone and rivastigmine. Decrease dose of lorazepam to 0.5 mg, will prefer haldol as a  first line agents before using benzodiazepines. Negative MRI for acute changes. I spoke with patient's care giver at the bedside, reported rapidly declining patient's cognition over last 6 months, currently in a memory unit. Poor prognosis, will consult palliative care team in am. Change aspirin to 81 mg dose to reduce risk of bleeding.   2. HTN. Systolic blood pressure 130 to 140 mmHg will continue  lisinopril and as needed IV hydralazine.  3. New Hypokalemia with hypercholremia. K appropriately corrected to 4,1, renal function continue to be preserved with serum cr at 0.84, will follow on renal panel in am, continue hydration with D5 solutions, will change ns to half normal saline to prevent worsening hyperchloremia. Serum bicarbonate stable at 24.   4. Dyslipidemia. On pravastatin.    DVT prophylaxis:enoxaparin Code Status:full Family Communication:I spoke with patient's care giver at the bedside and all questions were addressed.  Disposition Plan:change to inpatient.    Consultants:  Neurology  Procedures:    Antimicrobials:     Subjective: Patient not responsive, recognizes pain stimuli but not open eyes, or withdrawals. She did open her eyes to her care giver's voice and touch.   Objective: Vitals:   05/12/17 1931 05/13/17 0024 05/13/17 0356 05/13/17 0821  BP: (!) 155/96 (!) 95/53 (!) 148/65 (!) 143/77  Pulse: 92 66 70 72  Resp: Temp:   98 F (36.7 C)   TempSrc:   Axillary   SpO2: 98% 97% 95% 100%  Weight:      Height:        Intake/Output Summary (Last 24 hours) at 05/13/2017 1156 Last data filed at 05/13/2017 0600 Gross per 24 hour  Intake 1720 ml  Output 600 ml  Net 1120 ml   Filed Weights   05/10/17 1841 05/11/17 0200  Weight: 65.8 kg (145  lb) 60.7 kg (133 lb 13.1 oz)    Examination:   General: deconditioned and ill looking appearing.  Neurology: Awake and alert, non focal  E ENT: positive pallor, no icterus, oral  mucosa dry Cardiovascular: No JVD. S1-S2 present, rhythmic, no gallops, rubs, or murmurs. No lower extremity edema. Pulmonary: vesicular breath sounds bilaterally, adequate air movement, no wheezing, rhonchi or rales. Gastrointestinal. Abdomen with no organomegaly, non tender, no rebound or guarding Skin. No rashes Musculoskeletal: no joint deformities     Data Reviewed: I have personally reviewed following labs and imaging studies  CBC: Recent Labs  Lab 05/10/17 1701 05/10/17 1708 05/12/17 0347  WBC 3.9*  --  2.9*  NEUTROABS 1.9  --  1.6  HGB 11.2* 12.6 10.3*  HCT 34.8* 37.0 31.8*  MCV 85.7  --  85.3  PLT 207  --  164   Basic Metabolic Panel: Recent Labs  Lab 05/10/17 1701 05/10/17 1708 05/12/17 0347 05/13/17 0522  NA 141 141 143 143  K 4.1 3.9 3.1* 4.1  CL 108 105 111 113*  CO2 25  --  26 24  GLUCOSE 114* 113* 123* 94  BUN 19 21* 10 13  CREATININE 1.03* 0.90 0.79 0.84  CALCIUM 9.4  --  8.9 8.8*   GFR: Estimated Creatinine Clearance: 50.3 mL/min (by C-G formula based on SCr of 0.84 mg/dL). Liver Function Tests: Recent Labs  Lab 05/10/17 1701  AST 32  ALT 15  ALKPHOS 81  BILITOT 0.3  PROT 7.1  ALBUMIN 3.5   No results for input(s): LIPASE, AMYLASE in the last 168 hours. No results for input(s): AMMONIA in the last 168 hours. Coagulation Profile: Recent Labs  Lab 05/10/17 1701  INR 1.06   Cardiac Enzymes: No results for input(s): CKTOTAL, CKMB, CKMBINDEX, TROPONINI in the last 168 hours. BNP (last 3 results) No results for input(s): PROBNP in the last 8760 hours. HbA1C: No results for input(s): HGBA1C in the last 72 hours. CBG: Recent Labs  Lab 05/10/17 1700  GLUCAP 106*   Lipid Profile: No results for input(s): CHOL, HDL, LDLCALC, TRIG, CHOLHDL, LDLDIRECT in the last 72 hours. Thyroid Function Tests: No results for input(s): TSH, T4TOTAL, FREET4, T3FREE, THYROIDAB in the last 72 hours. Anemia Panel: No results for input(s): VITAMINB12,  FOLATE, FERRITIN, TIBC, IRON, RETICCTPCT in the last 72 hours.    Radiology Studies: I have reviewed all of the imaging during this hospital visit personally     Scheduled Meds: .  stroke: mapping our early stages of recovery book   Does not apply Once  . aspirin EC  325 mg Oral Daily  . enoxaparin (LOVENOX) injection  40 mg Subcutaneous Q24H  . feeding supplement (ENSURE ENLIVE)  237 mL Oral BID BM  . lactulose  20 g Oral BID  . multivitamin  1 tablet Oral Daily  . nystatin ointment  1 application Topical TID  . OLANZapine  2.5 mg Oral QHS  . pravastatin  20 mg Oral q1800  . risperiDONE  0.25 mg Oral Daily  . risperiDONE  0.5 mg Oral QHS  . rivastigmine  9.5 mg Transdermal Daily  . thiamine  100 mg Oral Daily   Continuous Infusions: . dextrose 5 % and 0.9% NaCl 50 mL/hr at 05/12/17 0816     LOS: 1 day        Mauricio Annett Gula, MD [facility] Pager [phone]

## 2017-05-14 LAB — BASIC METABOLIC PANEL
ANION GAP: 7 (ref 5–15)
BUN: 8 mg/dL (ref 6–20)
CO2: 21 mmol/L — ABNORMAL LOW (ref 22–32)
Calcium: 9 mg/dL (ref 8.9–10.3)
Chloride: 111 mmol/L (ref 101–111)
Creatinine, Ser: 0.71 mg/dL (ref 0.44–1.00)
Glucose, Bld: 94 mg/dL (ref 65–99)
Potassium: 3.9 mmol/L (ref 3.5–5.1)
SODIUM: 139 mmol/L (ref 135–145)

## 2017-05-14 NOTE — Progress Notes (Signed)
PROGRESS NOTE    Marissa Chung  ZOX:[id number] DOB: August 11, 1933 DOA: 05/10/2017 PCP: System, Pcp Not In    Brief Narrative:  84 year old female who presented with altered mentation from the skilled nursing facility. She does have the significant past medical history of frontotemporal dementia, hypertension and history of TIAs. She was noted to have a sudden change in her mentation, left gaze preference, worsening confusionandcombativeness.Apparently at baseline she is able to independently ambulate and feed herself. On the initial physical examination blood pressure 142/68, heart rate 66, respiratory rate 18, temperature 98.3, oxygen saturation 100%. Moist mucous membranes, lungs are clear to auscultation bilaterally, heart S1-S2 present and rhythmic, the abdomen was soft nontender, no lower extremity edema, patient was somnolent but nonfocal.Sodium 141, potassium 4.1, chloride 108, bicarb 25, glucose 114, BUN 19, creatinine 1.0,white count 3.9, hemoglobin 9.2, hematocrit 34.8, platelets 207.Urinalysis with specific gravity 1.045, negative protein.Head CT with no acute changes. Chest x-ray with left rotation, no infiltrates.EKG sinus rhythm, normal axis, normal intervals, poor R wave progression.  Patient was admitted to the hospital with a working diagnosis of metabolic encephalopathy, to rule out acute cerebrovascular accident.   Assessment & Plan:   Principal Problem:   Acute encephalopathy Active Problems:   Accelerated hypertension   Frontotemporal dementia   Encephalopathy   1. Metabolic encephalopathy with delirium in the setting of frontotemporal dementia. Patient still very somnolent this am, mild response to voice and touch. Will continue supportive medical therapy with IV fluids, multivitamins including thiamine. Po intake as tolerated. Has not received lorazepam or haldol over last 24 hours. Will dc lorazepan for now and will continue olanzapine at night 2,5 mg.  Continue risperidone and rivastigmine. Palliative care services consulted.   2. HTN.Holding scheduled antihypertensive agents, systolic blood pressure 114 to 160 mmHg.   3. New Hypokalemia with hypercholremia. Electrolytes have been corrected, will continue to follow on renal panel in am. Renal function with preserved cr at 0.71. Improved Na and cl, down to 139 and 111, will continue hydration with dextrose and half normal saline.   4. Dyslipidemia. Continue pravastatin for now.   DVT prophylaxis:enoxaparin Code Status:full Family Communication:no family at the bedside.   Disposition Plan:change to inpatient.   Consultants:  Neurology  Procedures:    Antimicrobials:     Subjective: Patient with no further agitation over last 24 hours, but persistent sedation. This am able to attempt open eyes to command. Unable to get further history due to encephalopathy.   Objective: Vitals:   05/13/17 2022 05/14/17 0022 05/14/17 0422 05/14/17 0753  BP:  (!) 156/77  (!) 114/91  Pulse:    85  Resp:    17  Temp: 98.1 F (36.7 C) 98.5 F (36.9 C) 98.6 F (37 C) 98.5 F (36.9 C)  TempSrc: Axillary Oral Oral Oral  SpO2:    100%  Weight:      Height:        Intake/Output Summary (Last 24 hours) at 05/14/2017 0937 Last data filed at 05/14/2017 0600 Gross per 24 hour  Intake 1427.5 ml  Output 700 ml  Net 727.5 ml   Filed Weights   05/10/17 1841 05/11/17 0200  Weight: 65.8 kg (145 lb) 60.7 kg (133 lb 13.1 oz)    Examination:   General: Not in pain or dyspnea, deconditioned Neurology: somnolent, attempts to open eyes to command.  E ENT: positive pallor, no icterus, oral mucosa moist Cardiovascular: No JVD. S1-S2 present, rhythmic, no gallops, rubs, or murmurs. No lower extremity edema.  Pulmonary: decreased breath sounds bilaterally at bases, adequate air movement, no wheezing, rhonchi or rales. Gastrointestinal. Abdomen flat, no organomegaly, non  tender, no rebound or guarding Skin. No rashes Musculoskeletal: no joint deformities     Data Reviewed: I have personally reviewed following labs and imaging studies  CBC: Recent Labs  Lab 05/10/17 1701 05/10/17 1708 05/12/17 0347  WBC 3.9*  --  2.9*  NEUTROABS 1.9  --  1.6  HGB 11.2* 12.6 10.3*  HCT 34.8* 37.0 31.8*  MCV 85.7  --  85.3  PLT 207  --  164   Basic Metabolic Panel: Recent Labs  Lab 05/10/17 1701 05/10/17 1708 05/12/17 0347 05/13/17 0522 05/14/17 0806  NA 141 141 143 143 139  K 4.1 3.9 3.1* 4.1 3.9  CL 108 105 111 113* 111  CO2 25  --  26 24 21*  GLUCOSE 114* 113* 123* 94 94  BUN 19 21* CREATININE 1.03* 0.90 0.79 0.84 0.71  CALCIUM 9.4  --  8.9 8.8* 9.0   GFR: Estimated Creatinine Clearance: 52.8 mL/min (by C-G formula based on SCr of 0.71 mg/dL). Liver Function Tests: Recent Labs  Lab 05/10/17 1701  AST 32  ALT 15  ALKPHOS 81  BILITOT 0.3  PROT 7.1  ALBUMIN 3.5   No results for input(s): LIPASE, AMYLASE in the last 168 hours. No results for input(s): AMMONIA in the last 168 hours. Coagulation Profile: Recent Labs  Lab 05/10/17 1701  INR 1.06   Cardiac Enzymes: No results for input(s): CKTOTAL, CKMB, CKMBINDEX, TROPONINI in the last 168 hours. BNP (last 3 results) No results for input(s): PROBNP in the last 8760 hours. HbA1C: No results for input(s): HGBA1C in the last 72 hours. CBG: Recent Labs  Lab 05/10/17 1700  GLUCAP 106*   Lipid Profile: No results for input(s): CHOL, HDL, LDLCALC, TRIG, CHOLHDL, LDLDIRECT in the last 72 hours. Thyroid Function Tests: No results for input(s): TSH, T4TOTAL, FREET4, T3FREE, THYROIDAB in the last 72 hours. Anemia Panel: No results for input(s): VITAMINB12, FOLATE, FERRITIN, TIBC, IRON, RETICCTPCT in the last 72 hours.    Radiology Studies: I have reviewed all of the imaging during this hospital visit personally     Scheduled Meds: .  stroke: mapping our early stages of  recovery book   Does not apply Once  . aspirin EC  81 mg Oral Daily  . enoxaparin (LOVENOX) injection  40 mg Subcutaneous Q24H  . feeding supplement (ENSURE ENLIVE)  237 mL Oral BID BM  . lactulose  20 g Oral BID  . multivitamin  1 tablet Oral Daily  . nystatin ointment  1 application Topical TID  . OLANZapine  2.5 mg Oral QHS  . pravastatin  20 mg Oral q1800  . risperiDONE  0.25 mg Oral Daily  . risperiDONE  0.5 mg Oral QHS  . rivastigmine  9.5 mg Transdermal Daily  . thiamine  100 mg Oral Daily   Continuous Infusions: . dextrose 5 % and 0.45% NaCl 50 mL/hr at 05/13/17 1727     LOS: 2 days        Coralie Keens, MD [facility] Pager [phone]

## 2017-05-14 NOTE — Evaluation (Signed)
Physical Therapy Evaluation Patient Details Name: Marissa Chung MRN: [id number] DOB: 1933-03-09 Today's Date: 05/14/2017   History of Present Illness  Patient is an 84 yo female who presents with metabolic encephalopathy with delirium in the setting of frontotemporal dementia.  Clinical Impression  Orders received for PT evaluation. Patient demonstrates deficits in functional mobility as indicated below. Will benefit from continued skilled PT to address deficits and maximize function. Will see as indicated and progress as tolerated.      Follow Up Recommendations SNF    Equipment Recommendations  None recommended by PT    Recommendations for Other Services       Precautions / Restrictions Precautions Precautions: Fall Restrictions Weight Bearing Restrictions: Yes      Mobility  Bed Mobility Overal bed mobility: Needs Assistance Bed Mobility: Supine to Sit;Sit to Supine     Supine to sit: Max assist;+2 for physical assistance Sit to supine: Max assist;+2 for physical assistance   General bed mobility comments: Max assist for all aspects of mobility at this time, question behavorial vs ability  Transfers Overall transfer level: Needs assistance Equipment used: 2 person hand held assist Transfers: Sit to/from Stand Sit to Stand: Max assist;Mod assist         General transfer comment: +2 max assist to come to upright at EOB x2. Patient limiting ability initially then required moderate assist on 3rd attempt  Ambulation/Gait                Stairs            Wheelchair Mobility    Modified Rankin (Stroke Patients Only)       Balance Overall balance assessment: Needs assistance Sitting-balance support: Feet supported Sitting balance-Leahy Scale: Poor   Postural control: Posterior lean   Standing balance-Leahy Scale: Poor                               Pertinent Vitals/Pain Faces Pain Scale: No hurt    Home Living  Family/patient expects to be discharged to:: Skilled nursing facility                      Prior Function Level of Independence: Needs assistance         Comments: no family present to determine baseline level of function     Hand Dominance   Dominant Hand: Right    Extremity/Trunk Assessment        Lower Extremity Assessment Lower Extremity Assessment: Difficult to assess due to impaired cognition    Cervical / Trunk Assessment Cervical / Trunk Assessment: Normal  Communication   Communication: HOH  Cognition Arousal/Alertness: Lethargic Behavior During Therapy: Flat affect Overall Cognitive Status: History of cognitive impairments - at baseline                                        General Comments      Exercises     Assessment/Plan    PT Assessment Patient needs continued PT services  PT Problem List Decreased strength;Decreased activity tolerance;Decreased balance;Decreased mobility;Decreased coordination;Decreased cognition;Decreased safety awareness       PT Treatment Interventions DME instruction;Gait training;Functional mobility training;Therapeutic activities;Therapeutic exercise;Balance training;Neuromuscular re-education;Cognitive remediation;Patient/family education    PT Goals (Current goals can be found in the Care Plan section)  Acute Rehab PT Goals Patient Stated Goal:  unable to state PT Goal Formulation: With patient Time For Goal Achievement: 05/28/17 Potential to Achieve Goals: Poor    Frequency Min 2X/week   Barriers to discharge        Co-evaluation               AM-PAC PT "6 Clicks" Daily Activity  Outcome Measure Difficulty turning over in bed (including adjusting bedclothes, sheets and blankets)?: Unable Difficulty moving from lying on back to sitting on the side of the bed? : Unable Difficulty sitting down on and standing up from a chair with arms (e.g., wheelchair, bedside commode, etc,.)?:  Unable Help needed moving to and from a bed to chair (including a wheelchair)?: Total Help needed walking in hospital room?: Total Help needed climbing 3-5 steps with a railing? : Total 6 Click Score: 6    End of Session Equipment Utilized During Treatment: Gait belt Activity Tolerance: Patient limited by lethargy;Treatment limited secondary to agitation Patient left: in bed;with call bell/phone within reach;with bed alarm set Nurse Communication: Mobility status PT Visit Diagnosis: Other symptoms and signs involving the nervous system (R29.898);Difficulty in walking, not elsewhere classified (R26.2)    Time: 9147-8295 PT Time Calculation (min) (ACUTE ONLY): 19 min   Charges:   PT Evaluation $PT Eval Moderate Complexity: 1 Mod     PT G Codes:        Charlotte Crumb, PT DPT  Board Certified Neurologic Specialist [phone]   Fabio Asa 05/14/2017, 2:19 PM

## 2017-05-14 NOTE — Progress Notes (Signed)
CSW received alert that patient arrived to hospital from memory care unit. CSW attempted to contact patient's family, left voicemail for patient's niece. CSW to complete assessment after contact with family.  Blenda Nicely, [place] Clinical Social Worker [phone]

## 2017-05-14 NOTE — Progress Notes (Signed)
Pt slept most of the shift woke up around 2300 and  Juices and apple sauces with medication given refuse dinner  Vs remained stable no agitation noted but pt remained confused need to be monitored closely.

## 2017-05-15 DIAGNOSIS — Z7189 Other specified counseling: Secondary | ICD-10-CM

## 2017-05-15 DIAGNOSIS — Z515 Encounter for palliative care: Secondary | ICD-10-CM

## 2017-05-15 DIAGNOSIS — E878 Other disorders of electrolyte and fluid balance, not elsewhere classified: Secondary | ICD-10-CM

## 2017-05-15 LAB — BASIC METABOLIC PANEL
Anion gap: 8 (ref 5–15)
BUN: 7 mg/dL (ref 6–20)
CALCIUM: 9.1 mg/dL (ref 8.9–10.3)
CO2: 25 mmol/L (ref 22–32)
Chloride: 109 mmol/L (ref 101–111)
Creatinine, Ser: 0.75 mg/dL (ref 0.44–1.00)
GFR calc Af Amer: >60 mL/min (ref >60)
GLUCOSE: 104 mg/dL — AB (ref 65–99)
POTASSIUM: 3.8 mmol/L (ref 3.5–5.1)
Sodium: 142 mmol/L (ref 135–145)

## 2017-05-15 NOTE — Progress Notes (Signed)
Spoke with niece/caregiver Burna Mortimer) via telephone for initial palliative goals of care conversation. Hard Choices book, MOST form, and PMT contact information left at bedside. Continue FULL code/FULL scope treatment. Full note to follow.   NO CHARGE  Vennie Homans, FNP-C Palliative Medicine Team  Phone: [phone] Fax: [phone]

## 2017-05-15 NOTE — Clinical Social Work Note (Signed)
Clinical Social Work Assessment  Patient Details  Name: Marissa Chung MRN: [id number] Date of Birth: 04/10/1933  Date of referral:  05/15/17               ReTyanne Derochersult:  Facility Placement, Discharge Planning, Other (Comment Required)(family needs information on other memory care facilities )                Permission sought to share information with:  Family Supports Permission granted to share information::  Yes, Verbal Permission Granted  Name::     Marissa Chung  Agency::  [facility]  Relationship::  [phone]  Contact Information:  [phone]  Housing/Transportation Living arrangements for the past 2 months:  Assisted Living Facility Source of Information:  Other (Comment Required)(pt niece) Patient Interpreter Needed:  None Criminal Activity/Legal Involvement Pertinent to Current Situation/Hospitalization:  No - Comment as needed Significant Relationships:  Other Family Members Lives with:  Facility Resident Do you feel safe going back to the place where you live?  Yes Need for family participation in patient care:  Yes (Comment)  Care giving concerns:  CSW unable to assess patient due to her dementia. CSW was able to speak to patients niece Marissa Chung) via phone   Social Worker assessment / plan:  CSW spoke to patients niece Marissa Chung via phone. Marissa Chung stated that patient is from [facility] [place], [place] [address]) an ALF Memory Care. Marissa Chung stated that patient has only been at facility for 3-4 weeks. Marissa Chung stated she would like her aunt to go back to facility because it is a closed unit and it is specifically made for people with dementia. Marissa Chung stated prior to living at the ALF her aunt was walking independently. Marissa Chung stated she is agreeable for patient to receive pt in the ALF to assist with patient returning back to her baseline. CSW will contact ALF to see if they will be able to offer PT/OT to patient  Employment status:  Retired Chief Financial Officer:  Medicare PT Recommendations:  Skilled Nursing Facility Information / Referral to community resources:  Skilled Nursing Facility  Patient/Family's Response to care:  Patients niece is supportive of patients needs. Niece Marissa Chung) is wanting more information on other memory care ALF in the [place] area to be able to give patient more options on facility Patient/Family's Understanding of and Emotional Response to Diagnosis, Current Treatment, and Prognosis:  Family wants patient to go back to ALF   Emotional Assessment Appearance:  Appears stated age Attitude/Demeanor/Rapport:  Unable to Assess(disoriented x 4) Affect (typically observed):  Unable to Assess Orientation:    Alcohol / Substance use:  Not Applicable Psych involvement (Current and /or in the community):  No (Comment)  Discharge Needs  Concerns to be addressed:  Other (Comment Required(family needing infomration on memory care in the area) Readmission within the last 30 days:  No Current discharge risk:  None Barriers to Discharge:  Continued Medical Work up   [employer], LCSW 05/15/2017, 1:05 PM

## 2017-05-15 NOTE — Progress Notes (Signed)
PMT consult received. Chart reviewed and patient assessment completed. Patient wakes to voice but drowsy with confusion. Voicemails left for both nieces, Burna Mortimer and Jasmine December, to initiate palliative care discussion. Awaiting return call. Will follow.   NO CHARGE  Vennie Homans, FNP-C Palliative Medicine Team  Phone: [phone] Fax: [phone]

## 2017-05-15 NOTE — Progress Notes (Signed)
PROGRESS NOTE    Marissa Chung  ZOX:[id number] DOB: 05/25/1933 DOA: 05/10/2017 PCP: System, Pcp Not In    Brief Narrative:  84 year old female who presented with altered mentation from the skilled nursing facility. She does have the significant past medical history of frontotemporal dementia, hypertension and history of TIAs. She was noted to have a sudden change in her mentation, left gaze preference, worsening confusionandcombativeness.Apparently at baseline she is able to independently ambulate and feed herself. On the initial physical examination blood pressure 142/68, heart rate 66, respiratory rate 18, temperature 98.3, oxygen saturation 100%. Moist mucous membranes, lungs are clear to auscultation bilaterally, heart S1-S2 present and rhythmic, the abdomen was soft nontender, no lower extremity edema, patient was somnolent but nonfocal.Sodium 141, potassium 4.1, chloride 108, bicarb 25, glucose 114, BUN 19, creatinine 1.0,white count 3.9, hemoglobin 9.2, hematocrit 34.8, platelets 207.Urinalysis with specific gravity 1.045, negative protein.Head CT with no acute changes. Chest x-ray with left rotation, no infiltrates.EKG sinus rhythm, normal axis, normal intervals, poor R wave progression.  Patient was admitted to the hospital with a working diagnosis of metabolic encephalopathy, to rule out acute cerebrovascular accident.    Assessment & Plan:   Principal Problem:   Acute encephalopathy Active Problems:   Accelerated hypertension   Frontotemporal dementia   Encephalopathy  1. Metabolic encephalopathywith deliriumin the setting of frontotemporal dementia.This am patient is more responsive and interactive, able to follow commands, improved attention, but continue to have disorganized thinking, has not required any dose of haldol. Will continue olanzapine at night. Continue daily risperidone and rivastigmine. Nutrition consult for assessment. Patient with severe  dementia with rapidly declining cognitive fusion, follow with palliative care recommendations. Continue thiamine and multivitamins.   2. HTN.Blood pressure has remained stable off antihypertensive agents, systolic 130 to 150 mmHg .   3. New Hypokalemiawith hypercholremia.Na at 142, K at 3,8 with Cl at 109 and serum bicarbonate at 25. Will continue hydration with dextrose and half normal saline. Renal function remained preserved with serum cr at 0.75.    4. Dyslipidemia.Continuepravastatin, with good toleration   DVT prophylaxis:enoxaparin Code Status:full Family Communication:no family at the bedside.   Disposition Plan:change to inpatient.   Consultants:  Neurology  Procedures:    Antimicrobials:     Subjective: Patient is more awake and reactive, no complains of dyspnea, chest pain, nausea or vomiting. Unable to get further information due to confusion.   Objective: Vitals:   05/15/17 0520 05/15/17 0613 05/15/17 0617 05/15/17 0808  BP: (!) 192/83 112/82  133/71  Pulse:  (!) 108 (!) 109 97  Resp:  Temp: 97.8 F (36.6 C)   98.3 F (36.8 C)  TempSrc: Axillary   Axillary  SpO2:  100% 100% 100%  Weight:      Height:        Intake/Output Summary (Last 24 hours) at 05/15/2017 1026 Last data filed at 05/15/2017 0524 Gross per 24 hour  Intake 190 ml  Output 1450 ml  Net -1260 ml   Filed Weights   05/10/17 1841 05/11/17 0200  Weight: 65.8 kg (145 lb) 60.7 kg (133 lb 13.1 oz)    Examination:   General: deconditioned Neurology: somnolent but easy to arouse, follows commands, and respond to simple questions, her attention has improved, continue to have disorganized thinking. On bilateral mittens.  E ENT: mild pallor, no icterus, oral mucosa moist Cardiovascular: No JVD. S1-S2 present, rhythmic, no gallops, rubs, or murmurs. No lower extremity edema. Pulmonary: vesicular breath sounds bilaterally,  adequate air movement, no  wheezing, rhonchi or rales. Gastrointestinal. Abdomen with no organomegaly, non tender, no rebound or guarding Skin. No rashes Musculoskeletal: no joint deformities     Data Reviewed: I have personally reviewed following labs and imaging studies  CBC: Recent Labs  Lab 05/10/17 1701 05/10/17 1708 05/12/17 0347  WBC 3.9*  --  2.9*  NEUTROABS 1.9  --  1.6  HGB 11.2* 12.6 10.3*  HCT 34.8* 37.0 31.8*  MCV 85.7  --  85.3  PLT 207  --  164   Basic Metabolic Panel: Recent Labs  Lab 05/10/17 1701 05/10/17 1708 05/12/17 0347 05/13/17 0522 05/14/17 0806 05/15/17 0634  NA 141 141 143 143 139 142  K 4.1 3.9 3.1* 4.1 3.9 3.8  CL 108 105 111 113* 111 109  CO2 25  --  26 24 21* 25  GLUCOSE 114* 113* 123* 94 94 104*  BUN 19 21* CREATININE 1.03* 0.90 0.79 0.84 0.71 0.75  CALCIUM 9.4  --  8.9 8.8* 9.0 9.1   GFR: Estimated Creatinine Clearance: 52.8 mL/min (by C-G formula based on SCr of 0.75 mg/dL). Liver Function Tests: Recent Labs  Lab 05/10/17 1701  AST 32  ALT 15  ALKPHOS 81  BILITOT 0.3  PROT 7.1  ALBUMIN 3.5   No results for input(s): LIPASE, AMYLASE in the last 168 hours. No results for input(s): AMMONIA in the last 168 hours. Coagulation Profile: Recent Labs  Lab 05/10/17 1701  INR 1.06   Cardiac Enzymes: No results for input(s): CKTOTAL, CKMB, CKMBINDEX, TROPONINI in the last 168 hours. BNP (last 3 results) No results for input(s): PROBNP in the last 8760 hours. HbA1C: No results for input(s): HGBA1C in the last 72 hours. CBG: Recent Labs  Lab 05/10/17 1700  GLUCAP 106*   Lipid Profile: No results for input(s): CHOL, HDL, LDLCALC, TRIG, CHOLHDL, LDLDIRECT in the last 72 hours. Thyroid Function Tests: No results for input(s): TSH, T4TOTAL, FREET4, T3FREE, THYROIDAB in the last 72 hours. Anemia Panel: No results for input(s): VITAMINB12, FOLATE, FERRITIN, TIBC, IRON, RETICCTPCT in the last 72 hours.    Radiology Studies: I have  reviewed all of the imaging during this hospital visit personally     Scheduled Meds: . aspirin EC  81 mg Oral Daily  . enoxaparin (LOVENOX) injection  40 mg Subcutaneous Q24H  . feeding supplement (ENSURE ENLIVE)  237 mL Oral BID BM  . lactulose  20 g Oral BID  . multivitamin  1 tablet Oral Daily  . nystatin ointment  1 application Topical TID  . OLANZapine  2.5 mg Oral QHS  . pravastatin  20 mg Oral q1800  . risperiDONE  0.25 mg Oral Daily  . risperiDONE  0.5 mg Oral QHS  . rivastigmine  9.5 mg Transdermal Daily  . thiamine  100 mg Oral Daily   Continuous Infusions: . dextrose 5 % and 0.45% NaCl 50 mL/hr at 05/14/17 1751     LOS: 3 days        Mauricio Annett Gula, MD [facility] Pager [phone]

## 2017-05-15 NOTE — Consult Note (Signed)
Consultation Note Date: 05/15/17  Patient Name: Marissa Chung  DOB: 02/12/1933  MRN: [id number]  Age / Sex: 84 y.o., female  PCP: System, Pcp Not In Referring Physician: Narda Bonds, MD  Reason for Consultation: Establishing goals of care  HPI/Patient Profile: 84 y.o. female  with past medical history of frontal lobe dementia, hypertension, shingles, and cataract admitted on 05/10/2017 with altered mental status from memory care. Code stroke initiated in ED. Chest xray negative. Neurology consulted. CT head/MRI negative for acute findings. EEG negative for seizures. Neurology feels overall presentation is most consistent with a hypoactive delirium. Hypokalemia corrected. Receiving IVF and multivitamins including thiamine. Palliative medicine consult for goals of care.   Clinical Assessment and Goals of Care: I have reviewed medical records, discussed with care team, and assessed patient. Patient wakes to voice but drowsy. Will take pills for RN. No s/s of pain.   Spoke with niece, Burna Mortimer, via telephone. Burna Mortimer works two jobs and only able to visit the hospital around 8pm.   I introduced Palliative Medicine as specialized medical care for people living with serious illness. It focuses on providing relief from the symptoms and stress of a serious illness. The goal is to improve quality of life for both the patient and the family.  We discussed a brief life review of the patient. Burna Mortimer tells me [name] was never married and without children. Her nieces and nephews have been her support system. 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 Burna Mortimer moved her down to [place] in 2015 when she was diagnosed with dementia and in order to be her primary caregiver. Burna Mortimer speaks of her four other siblings but that they are not involved in her care. Only visit with her a few times a year.   Patient was  recently moved from assisted living to a memory care unit in March. Burna Mortimer speaks of her dementia progressing since this January. She had a fall with a fractured arm in March. At memory care, she requires assist with ADL's and encouragement with eating/drinking. She does ambulate with minimal assist.   Discussed hospital diagnoses and interventions. Educated on disease trajectory of dementia.   I attempted to elicit values and goals of care important to the patient and family. Burna Mortimer becomes tearful during the conversation. She speaks of having a good understanding that dementia is a chronic, progressive disease but "hopeful we won't be at that stage" in regards to progression/end-stage. She feels the medications may be contributing to her drowsiness. We also discussed that this may be disease progression since she has been receiving these medications at the facility.   Advanced directives, concepts specific to code status, artifical feeding and hydration, and rehospitalization were considered and discussed. [name] does not have a living will or spoken of her wishes in regards to heroic measures. I educated on medical recommendations against heroic measures with progressive dementia, including resuscitation, life support, feeding tubes. I explained to Burna Mortimer that I would leave a Hard Choices copy along with  MOST form at bedside for her to review. Encouraged Burna Mortimer to start considering goals of care/EOL wishes for her aunt with underlying progressive dementia.   Burna Mortimer does share the challenges of being her caregiver and feeling like she's not doing enough. "Hard for her and hard for me." I reassured her that she has been a great advocate for her aunt and that she is in a safe environment (memory care). I encouraged her to continue to be a strong advocate regardless of where she is at. Burna Mortimer is always hoping "for a good day." She understands recommendation for physical therapy when discharged from hospital.     Palliative Care services outpatient were explained and offered. Burna Mortimer interested in having palliative [facility] at [facility].   Questions and concerns were addressed. PMT contact information left at bedside.     SUMMARY OF RECOMMENDATIONS    Spoke to niece Burna Mortimer) who is primary caregiver. Introduced role of palliative medicine.   Educated on disease trajectory and expectations of progressive dementia.   Continue FULL code/FULL scope treatment. Hard Choices copy and MOST form left at bedside for [facility]. I encouraged her to consider GOC and EOL wishes for her aunt with progressive dementia. Burna Mortimer understands she has big decisions to make in the future.  May benefit from continued support from outpatient palliative services for further GOC discussions and supportive care.   Code Status/Advance Care Planning:  Full code-Educated on medical recommendation against heroic measures at EOL with progressive dementia  Symptom Management:   Recommend discontinuing AM dose of Risperdal. May help with daytime somnolence?  Palliative Prophylaxis:   Aspiration, Delirium Protocol, Oral Care and Turn Reposition  Additional Recommendations (Limitations, Scope, Preferences):  Full Scope Treatment  Psycho-social/Spiritual:   Desire for further Chaplaincy support: yes  Additional Recommendations: Caregiving  Support/Resources and Education on Hospice  Prognosis:   Unable to determine  Discharge Planning: To Be Determined Likely back to Memory Care unit with PT      Primary Diagnoses: Present on Admission: . Accelerated hypertension . Frontotemporal dementia . Acute encephalopathy . Encephalopathy   I have reviewed the medical record, interviewed the patient and family, and examined the patient. The following aspects are pertinent.  Past Medical History:  Diagnosis Date  . Cataract    BILATERAL  . Frontal lobe dementia   . History of shingles   . Hypertension    Social History    Socioeconomic History  . Marital status: Widowed    Spouse name: Not on file  . Number of children: Not on file  . Years of education: Not on file  . Highest education level: Not on file  Occupational History  . Not on file  Social Needs  . Financial resource strain: Not on file  . Food insecurity:    Worry: Not on file    Inability: Not on file  . Transportation needs:    Medical: Not on file    Non-medical: Not on file  Tobacco Use  . Smoking status: Former Games developer  . Smokeless tobacco: Never Used  . Tobacco comment: quit smoking over 20 years ago   Substance and Sexual Activity  . Alcohol use: No  . Drug use: No  . Sexual activity: Not on file  Lifestyle  . Physical activity:    Days per week: Not on file    Minutes per session: Not on file  . Stress: Not on file  Relationships  . Social connections:    Talks on phone: Not on  file    Gets together: Not on file    Attends religious service: Not on file    Active member of club or organization: Not on file    Attends meetings of clubs or organizations: Not on file    Relationship status: Not on file  Other Topics Concern  . Not on file  Social History Narrative  . Not on file   History reviewed. No pertinent family history. Scheduled Meds: . aspirin EC  81 mg Oral Daily  . enoxaparin (LOVENOX) injection  40 mg Subcutaneous Q24H  . feeding supplement (ENSURE ENLIVE)  237 mL Oral BID BM  . lactulose  20 g Oral BID  . multivitamin  1 tablet Oral Daily  . nystatin ointment  1 application Topical TID  . OLANZapine  2.5 mg Oral QHS  . pravastatin  20 mg Oral q1800  . risperiDONE  0.25 mg Oral Daily  . risperiDONE  0.5 mg Oral QHS  . rivastigmine  9.5 mg Transdermal Daily  . thiamine  100 mg Oral Daily   Continuous Infusions: . dextrose 5 % and 0.45% NaCl 50 mL/hr at 05/16/17 1135   PRN Meds:.acetaminophen **OR** acetaminophen (TYLENOL) oral liquid 160 mg/5 mL **OR** acetaminophen, hydrALAZINE,  senna-docusate Medications Prior to Admission:  Prior to Admission medications   Medication Sig Start Date End Date Taking? Authorizing Provider  aspirin EC 325 MG tablet Take 325 mg by mouth daily.    Yes [provider]  feeding supplement, ENSURE COMPLETE, (ENSURE COMPLETE) LIQD Take 237 mLs by mouth 2 (two) times daily between meals. 07/28/13  Yes Dhungel, Nishant, MD  lactulose (CHRONULAC) 10 GM/15ML solution Take 20 g by mouth 2 (two) times daily.   Yes [provider]  lisinopril (PRINIVIL,ZESTRIL) 20 MG tablet Take 1 tablet (20 mg total) by mouth daily. 03/11/16 05/10/17 Yes Richarda Overlie, MD  nystatin ointment (MYCOSTATIN) Apply 1 application topically 3 (three) times daily. To lower back   Yes [provider]  OLANZapine (ZYPREXA) 2.5 MG tablet Take 2.5 mg by mouth at bedtime.   Yes [provider]  pravastatin (PRAVACHOL) 20 MG tablet Take 1 tablet (20 mg total) by mouth daily at 6 PM. 03/12/16  Yes Marvel Plan, MD  risperiDONE (RISPERDAL) 0.25 MG tablet Take 1-2 tablets (0.25-0.5 mg total) by mouth 2 (two) times daily. .25 mg am and .50 mg at bedtime Patient taking differently: Take 1 mg by mouth at bedtime.  08/29/13  Yes Osvaldo Shipper, MD  rivastigmine (EXELON) 9.5 mg/24hr Place 9.5 mg onto the skin daily.   Yes [provider]   Allergies  Allergen Reactions  . Shellfish Allergy Hives   Review of Systems  Unable to perform ROS  Physical Exam  Constitutional: She is easily aroused. She appears ill.  HENT:  Head: Normocephalic and atraumatic.  Pulmonary/Chest: No accessory muscle usage. No tachypnea. No respiratory distress.  Neurological: She is easily aroused.  Wakes to voice. Pleasantly confused.   Skin: Skin is warm and dry.  Psychiatric: Her speech is delayed. Cognition and memory are impaired. She is inattentive.  Nursing note and vitals reviewed.   Vital Signs: BP (!) 161/82 (BP Location: Right Leg)   Pulse (!) 106    Temp 98.3 F (36.8 C) (Oral)   Resp 14   Ht  (1.702 m)   Wt 60.7 kg (133 lb 13.1 oz)   SpO2 100%   BMI 20.96 kg/m  Pain Scale: 0-10   Pain Score: 0-No pain  SpO2: SpO2: 100 % O2 Device:SpO2: 100 % O2 Flow Rate: .   IO: Intake/output summary:   Intake/Output Summary (Last 24 hours) at 05/16/2017 1154 Last data filed at 05/15/2017 1447 Gross per 24 hour  Intake -  Output 550 ml  Net -550 ml    LBM: Last BM Date: 05/15/17 Baseline Weight: Weight: 65.8 kg (145 lb) Most recent weight: Weight: 60.7 kg (133 lb 13.1 oz)     Palliative Assessment/Data: PPS 50%   Flowsheet Rows     Most Recent Value  Intake Tab  Referral Department  Hospitalist  Unit at Time of Referral  Med/Surg Unit  Palliative Care Primary Diagnosis  Neurology  Palliative Care Type  New Palliative care  Reason for referral  Clarify Goals of Care  Date first seen by Palliative Care  05/15/17  Clinical Assessment  Palliative Performance Scale Score  50%  Psychosocial & Spiritual Assessment  Palliative Care Outcomes  Patient/Family meeting held?  Yes  Who was at the meeting?  niece  Palliative Care Outcomes  Clarified goals of care, ACP counseling assistance, Provided psychosocial or spiritual support, Linked to palliative care logitudinal support      Time In: 1540 Time Out: 1630 Time Total: Greater than 50%  of this time was spent counseling and coordinating care related to the above assessment and plan.  Signed by:  Vennie Homans, FNP-C Palliative Medicine Team  Phone: [phone] Fax: [phone]   Please contact Palliative Medicine Team phone at [phone] for questions and concerns.  For individual provider: See Loretha Stapler

## 2017-05-16 DIAGNOSIS — Z515 Encounter for palliative care: Secondary | ICD-10-CM

## 2017-05-16 DIAGNOSIS — E860 Dehydration: Secondary | ICD-10-CM

## 2017-05-16 DIAGNOSIS — Z7189 Other specified counseling: Secondary | ICD-10-CM

## 2017-05-16 DIAGNOSIS — R638 Other symptoms and signs concerning food and fluid intake: Secondary | ICD-10-CM

## 2017-05-16 LAB — BASIC METABOLIC PANEL
Anion gap: 8 (ref 5–15)
BUN: 6 mg/dL (ref 6–20)
CHLORIDE: 106 mmol/L (ref 101–111)
CO2: 27 mmol/L (ref 22–32)
Calcium: 9.1 mg/dL (ref 8.9–10.3)
Creatinine, Ser: 0.73 mg/dL (ref 0.44–1.00)
GFR calc non Af Amer: >60 mL/min (ref >60)
GLUCOSE: 115 mg/dL — AB (ref 65–99)
Potassium: 3.6 mmol/L (ref 3.5–5.1)
Sodium: 141 mmol/L (ref 135–145)

## 2017-05-16 MED ORDER — MEGESTROL ACETATE 400 MG/10ML PO SUSP
400.0000 mg | Freq: Every day | ORAL | Status: DC
  Administered 2017-05-16 – 2017-05-18 (×3): 400 mg via ORAL
  Filled 2017-05-16 (×3): qty 10

## 2017-05-16 NOTE — Discharge Summary (Addendum)
Physician Discharge Summary  Marissa Chung EXB:[id number] DOB: 03-Sep-1933 DOA: 05/10/2017  PCP: System, Pcp Not In  Admit date: 05/10/2017 Discharge date: 05/18/2017  Admitted From: ALF Disposition: ALF  Recommendations for Outpatient Follow-up:  1. Follow up with PCP in 1 week 2. Encourage good oral intake for nutrition; started on Megace. Please assess effectiveness and discontinue if not effective 3. Please obtain BMP/CBC in one week 4. Recommend outpatient palliative care referral 5. Please follow up on the following pending results: None   Discharge Condition: Stable CODE STATUS: Full code Diet recommendation: Heart healthy   Brief/Interim Summary:  Admission HPI written by Briscoe Deutscher, MD   Chief Complaint: AMS   HPI: Marissa Chung is a 84 y.o. female with medical history significant for frontotemporal dementia, hypertension, and history of TIA, now presenting from her nursing facility for evaluation of altered mental status.  Patient reportedly talks, feeds herself, and ambulates on her own, but was noted today to have an apparent gaze deviation to the left and was not talking as usual, seemingly with increased confusion and a period of combativeness.  She may have fallen prior to this but history is limited.  ED Course: Upon arrival to the ED, patient is found to be afebrile, saturating well on room air, mildly hypertensive, and vitals otherwise normal.  EKG features a sinus rhythm, chest x-ray is negative for acute cardiopulmonary disease, and noncontrast head CT is negative for acute intracranial abnormality.  CTA head and neck is negative for large vessel occlusion, dissection, or hemodynamically significant stenosis.  Chemistry panel is notable for a slight renal insufficiency and CBC features a slight leukopenia and stable mild anemia.  INR and troponin are normal and urinalysis is notable for an elevated specific gravity.  Neurology canceled the code stroke,  feeling this is more likely delirium, and recommends medical admission for further evaluation.    Hospital course:  Metabolic encephalopathy Secondary to delirium in setting of frontotemporal dementia. Improved. Urinalysis not suggestive of UTI. Afebrile. MRI obtained for concern of stroke, which was negative for acute findings. Per report, patient at baseline prior to discharge.  Delirium Frontotemporal dementia Patient initially required haldol for treatment, but this requirement has resolved. No issues with delirium prior to discharge stable. Risperidone changed to 0.5 mg QHS.  Acute urinary retention Foley placed on admission. Voiding trial on 5/15 and patient has good urine output. Resolved.  Hypokalemia Supplementation  Dyslipidemia Continued pravastatin  Poor oral intake Patient seen by dietician. Started on Megace. Oral intake improved prior to discharge. Assess for continued need of Megace  Hypertension Patient restarted on home dose, but suffered from hypotension with 20 mg and 10 mg dose. Hypertensive while supine. Will discontinue lisinopril. Could consider adding back at 2.5 mg daily and titrate up slowly vs choosing a different antihypertensive.   Discharge Diagnoses:  Principal Problem:   Acute encephalopathy Active Problems:   Accelerated hypertension   Frontotemporal dementia   Encephalopathy   Palliative care by specialist   Goals of care, counseling/discussion    Discharge Instructions  Discharge Instructions    Diet - low sodium heart healthy   Complete by:  As directed    Increase activity slowly   Complete by:  As directed      Allergies as of 05/18/2017      Reactions   Shellfish Allergy Hives      Medication List    STOP taking these medications   lisinopril 20 MG tablet Commonly known  as:  PRINIVIL,ZESTRIL     TAKE these medications   aspirin 81 MG EC tablet Take 1 tablet (81 mg total) by mouth daily. Start taking on:   05/19/2017 What changed:    medication strength  how much to take   feeding supplement (ENSURE COMPLETE) Liqd Take 237 mLs by mouth 2 (two) times daily between meals.   lactulose 10 GM/15ML solution Commonly known as:  CHRONULAC Take 20 g by mouth 2 (two) times daily.   megestrol 400 MG/10ML suspension Commonly known as:  MEGACE Take 10 mLs (400 mg total) by mouth daily. Start taking on:  05/19/2017   nystatin ointment Commonly known as:  MYCOSTATIN Apply 1 application topically 3 (three) times daily. To lower back   OLANZapine 2.5 MG tablet Commonly known as:  ZYPREXA Take 2.5 mg by mouth at bedtime.   pravastatin 20 MG tablet Commonly known as:  PRAVACHOL Take 1 tablet (20 mg total) by mouth daily at 6 PM.   risperiDONE 0.5 MG tablet Commonly known as:  RISPERDAL Take 1 tablet (0.5 mg total) by mouth at bedtime. What changed:    medication strength  how much to take  when to take this  additional instructions   rivastigmine 9.5 mg/24hr Commonly known as:  EXELON Place 9.5 mg onto the skin daily.       Allergies  Allergen Reactions  . Shellfish Allergy Hives    Consultations:  Palliative care medicine   Procedures/Studies: Ct Angio Head W Or Wo Contrast  Addendum Date: 05/10/2017   ADDENDUM REPORT: 05/10/2017 18:18 ADDENDUM: These results were called by telephone at the time of interpretation on 05/10/2017 at 6:18 pm to Dr. Caryl Pina , who verbally acknowledged these results. Electronically Signed   By: Mitzi Hansen M.D.   On: 05/10/2017 18:18   Result Date: 05/10/2017 CLINICAL DATA:  84 y/o  F; stroke for follow-up. EXAM: CT ANGIOGRAPHY HEAD AND NECK TECHNIQUE: Multidetector CT imaging of the head and neck was performed using the standard protocol during bolus administration of intravenous contrast. Multiplanar CT image reconstructions and MIPs were obtained to evaluate the vascular anatomy. Carotid stenosis measurements (when applicable)  are obtained utilizing NASCET criteria, using the distal internal carotid diameter as the denominator. CONTRAST:  97 cc Isovue 370 COMPARISON:  05/10/2017 CT head. 03/11/2016 CT angiogram head and neck. FINDINGS: CTA NECK FINDINGS Aortic arch: Standard branching. Imaged portion shows no evidence of aneurysm or dissection. No significant stenosis of the major arch vessel origins. Moderate calcific atherosclerosis of the aorta. Right carotid system: No evidence of dissection, stenosis (50% or greater) or occlusion. Calcified plaque of the right carotid bifurcation with mild less than 50% proximal ICA stenosis. Left carotid system: No evidence of dissection, stenosis (50% or greater) or occlusion. Vertebral arteries: Left dominant. No evidence of dissection, stenosis (50% or greater) or occlusion. Skeleton: Moderate spondylosis of the cervical spine greatest at the C6-7 level. No high-grade bony canal stenosis. Other neck: Left lobe of thyroid nodule measuring up to 14 mm. Upper chest: Right upper lobe pulmonary nodule is decreased in size measuring 9 mm, previously 11 mm (series 9, image 175). Review of the MIP images confirms the above findings CTA HEAD FINDINGS Anterior circulation: Left A2/3 short segment of moderate, increased from prior study (series 13, image 77). No large vessel occlusion, aneurysm, or vascular malformation. Posterior circulation: Right P2 mild stenosis. No large vessel occlusion, aneurysm, or vascular malformation. Venous sinuses: As permitted by contrast timing, patent. Anatomic variants: Small  bilateral posterior communicating arteries. No anterior communicating artery identified. Review of the MIP images confirms the above findings IMPRESSION: 1. Patent anterior and posterior intracranial circulation. No large vessel occlusion, aneurysm, or vascular malformation. 2. Patent carotid and vertebral systems. No dissection, aneurysm, or hemodynamically significant stenosis by NASCET criteria. 3.  Moderate left A2/3 stenosis, increased from prior study. 4. Right P2 mild stenosis. 5. Right proximal ICA mild less than 50% stenosis with calcified plaque. 6. Right upper lobe pulmonary nodule is decreased in size measuring 9 mm, previously 11 mm. Electronically Signed: By: Mitzi Hansen M.D. On: 05/10/2017 18:03   Ct Angio Neck W And/or Wo Contrast  Addendum Date: 05/10/2017   ADDENDUM REPORT: 05/10/2017 18:18 ADDENDUM: These results were called by telephone at the time of interpretation on 05/10/2017 at 6:18 pm to Dr. Caryl Pina , who verbally acknowledged these results. Electronically Signed   By: Mitzi Hansen M.D.   On: 05/10/2017 18:18   Result Date: 05/10/2017 CLINICAL DATA:  84 y/o  F; stroke for follow-up. EXAM: CT ANGIOGRAPHY HEAD AND NECK TECHNIQUE: Multidetector CT imaging of the head and neck was performed using the standard protocol during bolus administration of intravenous contrast. Multiplanar CT image reconstructions and MIPs were obtained to evaluate the vascular anatomy. Carotid stenosis measurements (when applicable) are obtained utilizing NASCET criteria, using the distal internal carotid diameter as the denominator. CONTRAST:  97 cc Isovue 370 COMPARISON:  05/10/2017 CT head. 03/11/2016 CT angiogram head and neck. FINDINGS: CTA NECK FINDINGS Aortic arch: Standard branching. Imaged portion shows no evidence of aneurysm or dissection. No significant stenosis of the major arch vessel origins. Moderate calcific atherosclerosis of the aorta. Right carotid system: No evidence of dissection, stenosis (50% or greater) or occlusion. Calcified plaque of the right carotid bifurcation with mild less than 50% proximal ICA stenosis. Left carotid system: No evidence of dissection, stenosis (50% or greater) or occlusion. Vertebral arteries: Left dominant. No evidence of dissection, stenosis (50% or greater) or occlusion. Skeleton: Moderate spondylosis of the cervical spine greatest  at the C6-7 level. No high-grade bony canal stenosis. Other neck: Left lobe of thyroid nodule measuring up to 14 mm. Upper chest: Right upper lobe pulmonary nodule is decreased in size measuring 9 mm, previously 11 mm (series 9, image 175). Review of the MIP images confirms the above findings CTA HEAD FINDINGS Anterior circulation: Left A2/3 short segment of moderate, increased from prior study (series 13, image 77). No large vessel occlusion, aneurysm, or vascular malformation. Posterior circulation: Right P2 mild stenosis. No large vessel occlusion, aneurysm, or vascular malformation. Venous sinuses: As permitted by contrast timing, patent. Anatomic variants: Small bilateral posterior communicating arteries. No anterior communicating artery identified. Review of the MIP images confirms the above findings IMPRESSION: 1. Patent anterior and posterior intracranial circulation. No large vessel occlusion, aneurysm, or vascular malformation. 2. Patent carotid and vertebral systems. No dissection, aneurysm, or hemodynamically significant stenosis by NASCET criteria. 3. Moderate left A2/3 stenosis, increased from prior study. 4. Right P2 mild stenosis. 5. Right proximal ICA mild less than 50% stenosis with calcified plaque. 6. Right upper lobe pulmonary nodule is decreased in size measuring 9 mm, previously 11 mm. Electronically Signed: By: Mitzi Hansen M.D. On: 05/10/2017 18:03   Mr Brain Wo Contrast  Result Date: 05/13/2017 CLINICAL DATA:  Altered mental status. Increasing confusion and episodic combativeness. LEFT gaze deviation and altered speech. History of frontal lobe dementia, encephalopathy, hypertension. EXAM: MRI HEAD WITHOUT CONTRAST MRA HEAD WITHOUT CONTRAST TECHNIQUE: Multiplanar,  multiecho pulse sequences of the brain and surrounding structures were obtained without intravenous contrast. Angiographic images of the head were obtained using MRA technique without contrast. COMPARISON:  CT/CTA  HEAD May 10, 2017 and MRI head March 10, 2016 FINDINGS: MRI HEAD FINDINGS INTRACRANIAL CONTENTS: No reduced diffusion to suggest acute ischemia, hypercellular tumor or typical infection. No susceptibility artifact to suggest hemorrhage. Similar patchy to confluent supratentorial white matter FLAIR T2 hyperintensities. The ventricles and sulci are normal for patient's age. No suspicious parenchymal signal, masses, mass effect. No abnormal extra-axial fluid collections. No extra-axial masses. Stable mild cerebellar tonsillar ectopia which do not reach criteria for Chiari 1 malformation. VASCULAR: Normal major intracranial vascular flow voids present at skull base. SKULL AND UPPER CERVICAL SPINE: No abnormal sellar expansion. No suspicious calvarial bone marrow signal. Craniocervical junction maintained. SINUSES/ORBITS: Trace RIGHT mastoid effusion. Trace paranasal sinus mucosal thickening. Included ocular globes and orbital contents are non-suspicious. OTHER: None. MRA HEAD FINDINGS-moderately motion degraded examination. ANTERIOR CIRCULATION: Patent internal carotid arteries with mild luminal irregularity. 2 mm stable superiorly directed outpouching LEFT cavernous ICA. Moderate to severe LEFT A2-3 junction stenosis. No large vessel occlusion. POSTERIOR CIRCULATION: LEFT vertebral artery is dominant. Basilar artery is patent, with normal flow related enhancement of the main branch vessels. Patent posterior cerebral arteries. Moderate stenosis RIGHT mid P2 segment and distal LEFT P2 segment. ANATOMIC VARIANTS: None. Source images and MIP images were reviewed. IMPRESSION: MRI HEAD: 1. No acute intracranial process. 2. Stable examination including moderate chronic small vessel ischemic disease. MRA HEAD: 1. No emergent large vessel occlusion on this moderately motion degraded examination. 2. Stable moderate to severe stenosis LEFT ACA. Moderate stenosis bilateral PCA. 3. Stable 2 mm infundibulum versus aneurysm LEFT  cavernous ICA. Electronically Signed   By: Awilda Metro M.D.   On: 05/13/2017 00:35   Dg Chest Port 1 View  Result Date: 05/10/2017 CLINICAL DATA:  UTI. EXAM: PORTABLE CHEST 1 VIEW COMPARISON:  08/27/2013 FINDINGS: 1902 hours. Lungs are hyperexpanded. The lungs are clear without focal pneumonia, edema, pneumothorax or pleural effusion. Calcified granuloma identified right upper lobe. The cardiopericardial silhouette is within normal limits for size. The visualized bony structures of the thorax are intact. Telemetry leads overlie the chest. IMPRESSION: No active disease. Electronically Signed   By: Kennith Center M.D.   On: 05/10/2017 19:51   Mr Maxine Glenn Head Wo Contrast  Result Date: 05/13/2017 CLINICAL DATA:  Altered mental status. Increasing confusion and episodic combativeness. LEFT gaze deviation and altered speech. History of frontal lobe dementia, encephalopathy, hypertension. EXAM: MRI HEAD WITHOUT CONTRAST MRA HEAD WITHOUT CONTRAST TECHNIQUE: Multiplanar, multiecho pulse sequences of the brain and surrounding structures were obtained without intravenous contrast. Angiographic images of the head were obtained using MRA technique without contrast. COMPARISON:  CT/CTA HEAD May 10, 2017 and MRI head March 10, 2016 FINDINGS: MRI HEAD FINDINGS INTRACRANIAL CONTENTS: No reduced diffusion to suggest acute ischemia, hypercellular tumor or typical infection. No susceptibility artifact to suggest hemorrhage. Similar patchy to confluent supratentorial white matter FLAIR T2 hyperintensities. The ventricles and sulci are normal for patient's age. No suspicious parenchymal signal, masses, mass effect. No abnormal extra-axial fluid collections. No extra-axial masses. Stable mild cerebellar tonsillar ectopia which do not reach criteria for Chiari 1 malformation. VASCULAR: Normal major intracranial vascular flow voids present at skull base. SKULL AND UPPER CERVICAL SPINE: No abnormal sellar expansion. No suspicious  calvarial bone marrow signal. Craniocervical junction maintained. SINUSES/ORBITS: Trace RIGHT mastoid effusion. Trace paranasal sinus mucosal thickening. Included ocular globes  and orbital contents are non-suspicious. OTHER: None. MRA HEAD FINDINGS-moderately motion degraded examination. ANTERIOR CIRCULATION: Patent internal carotid arteries with mild luminal irregularity. 2 mm stable superiorly directed outpouching LEFT cavernous ICA. Moderate to severe LEFT A2-3 junction stenosis. No large vessel occlusion. POSTERIOR CIRCULATION: LEFT vertebral artery is dominant. Basilar artery is patent, with normal flow related enhancement of the main branch vessels. Patent posterior cerebral arteries. Moderate stenosis RIGHT mid P2 segment and distal LEFT P2 segment. ANATOMIC VARIANTS: None. Source images and MIP images were reviewed. IMPRESSION: MRI HEAD: 1. No acute intracranial process. 2. Stable examination including moderate chronic small vessel ischemic disease. MRA HEAD: 1. No emergent large vessel occlusion on this moderately motion degraded examination. 2. Stable moderate to severe stenosis LEFT ACA. Moderate stenosis bilateral PCA. 3. Stable 2 mm infundibulum versus aneurysm LEFT cavernous ICA. Electronically Signed   By: Awilda Metro M.D.   On: 05/13/2017 00:35   Ct Head Code Stroke Wo Contrast  Result Date: 05/10/2017 CLINICAL DATA:  Code stroke. Onset of weakness of the BILATERAL legs. Altered mental status. Dementia. Asymmetric RIGHT arm decreased movement. EXAM: CT HEAD WITHOUT CONTRAST TECHNIQUE: Contiguous axial images were obtained from the base of the skull through the vertex without intravenous contrast. COMPARISON:  CT head, MR head, and CTA head neck 03/10/2016, 03/11/2016. FINDINGS: Brain: No evidence for acute infarction, hemorrhage, mass lesion, hydrocephalus, or extra-axial fluid. Generalized atrophy. Hypoattenuation of white matter, likely small vessel disease. Vascular: Calcification of the  cavernous internal carotid arteries consistent with cerebrovascular atherosclerotic disease. No signs of intracranial large vessel occlusion. Skull: Normal. Negative for fracture or focal lesion. Sinuses/Orbits: No acute finding. Other: None. ASPECTS [facility] Stroke Program Early CT Score) - Ganglionic level infarction (caudate, lentiform nuclei, internal capsule, insula, M1-M3 cortex): 7 - Supraganglionic infarction (M4-M6 cortex): 3 Total score (0-10 with 10 being normal): 10 IMPRESSION: 1. Atrophy and small vessel disease. No acute intracranial abnormality. 2. ASPECTS is 10. These results were communicated to Dr. Otelia Limes at 5:26 pmon 5/9/2019by text page via the [facility] messaging system. Electronically Signed   By: Elsie Stain M.D.   On: 05/10/2017 17:32     Subjective: No issues overnight  Discharge Exam: Vitals:   05/18/17 1141 05/18/17 1532  BP: (!) 86/57 (!) 159/81  Pulse: 82   Resp:    Temp:    SpO2: 98%    Vitals:   05/18/17 0800 05/18/17 0823 05/18/17 0936 05/18/17 1141  BP: 134/75 (!) 158/68 134/75 (!) 86/57  Pulse: 77 79 77 82  Resp:  17 15   Temp: 98.7 F (37.1 C) 98.7 F (37.1 C) 98.7 F (37.1 C)   TempSrc: Oral Oral Oral Axillary  SpO2: 96%   98%  Weight:      Height:       General exam: Appears calm and comfortable Respiratory system: Clear to auscultation. Respiratory effort normal. Cardiovascular system: S1 & S2 heard, RRR. No murmurs, rubs, gallops or clicks. Gastrointestinal system: Abdomen is nondistended, soft and nontender. Normal bowel sounds heard. Central nervous system: Alert and oriented to person and place.  Follows commands.  Extremities: No edema. No calf tenderness Skin: No cyanosis. No rashes Psychiatry: Judgement and insight appear poor. Flat affect   The results of significant diagnostics from this hospitalization (including imaging, microbiology, ancillary and laboratory) are listed below for reference.     Microbiology: Recent Results  (from the past 240 hour(s))  MRSA PCR Screening     Status: None   Collection Time: 05/12/17  1:42 AM  Result Value Ref Range Status   MRSA by PCR NEGATIVE NEGATIVE Final    Comment:        The GeneXpert MRSA Assay (FDA approved for NASAL specimens only), is one component of a comprehensive MRSA colonization surveillance program. It is not intended to diagnose MRSA infection nor to guide or monitor treatment for MRSA infections. Performed at [facility], [address] [place], [place] [address]      Labs: BNP (last 3 results) No results for input(s): BNP in the last 8760 hours. Basic Metabolic Panel: Recent Labs  Lab 05/12/17 0347 05/13/17 0522 05/14/17 0806 05/15/17 0634 05/16/17 0545 05/18/17 0425  NA 143 143 139 142 141  --   K 3.1* 4.1 3.9 3.8 3.6  --   CL 111 113* 111 109 106  --   CO2 26 24 21* 25 27  --   GLUCOSE 123* 94 94 104* 115*  --   BUN --   CREATININE 0.79 0.84 0.71 0.75 0.73 0.85  CALCIUM 8.9 8.8* 9.0 9.1 9.1  --    Liver Function Tests: No results for input(s): AST, ALT, ALKPHOS, BILITOT, PROT, ALBUMIN in the last 168 hours. No results for input(s): LIPASE, AMYLASE in the last 168 hours. No results for input(s): AMMONIA in the last 168 hours. CBC: Recent Labs  Lab 05/12/17 0347 05/17/17 0400  WBC 2.9* 4.4  NEUTROABS 1.6  --   HGB 10.3* 10.9*  HCT 31.8* 33.8*  MCV 85.3 85.8  PLT 164 157   Cardiac Enzymes: No results for input(s): CKTOTAL, CKMB, CKMBINDEX, TROPONINI in the last 168 hours. BNP: Invalid input(s): POCBNP CBG: No results for input(s): GLUCAP in the last 168 hours. D-Dimer No results for input(s): DDIMER in the last 72 hours. Hgb A1c No results for input(s): HGBA1C in the last 72 hours. Lipid Profile No results for input(s): CHOL, HDL, LDLCALC, TRIG, CHOLHDL, LDLDIRECT in the last 72 hours. Thyroid function studies No results for input(s): TSH, T4TOTAL, T3FREE, THYROIDAB in the last 72  hours.  Invalid input(s): FREET3 Anemia work up No results for input(s): VITAMINB12, FOLATE, FERRITIN, TIBC, IRON, RETICCTPCT in the last 72 hours. Urinalysis    Component Value Date/Time   COLORURINE YELLOW 05/10/2017 2205   APPEARANCEUR CLEAR 05/10/2017 2205   LABSPEC 1.045 (H) 05/10/2017 2205   PHURINE 5.0 05/10/2017 2205   GLUCOSEU NEGATIVE 05/10/2017 2205   HGBUR NEGATIVE 05/10/2017 2205   BILIRUBINUR NEGATIVE 05/10/2017 2205   KETONESUR NEGATIVE 05/10/2017 2205   PROTEINUR NEGATIVE 05/10/2017 2205   UROBILINOGEN 1.0 08/27/2013 1112   NITRITE NEGATIVE 05/10/2017 2205   LEUKOCYTESUR NEGATIVE 05/10/2017 2205   Sepsis Labs Invalid input(s): PROCALCITONIN,  WBC,  LACTICIDVEN Microbiology Recent Results (from the past 240 hour(s))  MRSA PCR Screening     Status: None   Collection Time: 05/12/17  1:42 AM  Result Value Ref Range Status   MRSA by PCR NEGATIVE NEGATIVE Final    Comment:        The GeneXpert MRSA Assay (FDA approved for NASAL specimens only), is one component of a comprehensive MRSA colonization surveillance program. It is not intended to diagnose MRSA infection nor to guide or monitor treatment for MRSA infections. Performed at [facility], [address], [place], [place] [address]      SIGNED:   Jacquelin Hawking, MD [facility] 05/18/2017, 1:04 PM

## 2017-05-16 NOTE — Progress Notes (Addendum)
Initial Nutrition Assessment  DOCUMENTATION CODES:   Not applicable  INTERVENTION:  Continue Ensure Enlive po BID, each supplement provides 350 kcal and 20 grams of protein  Recommend Megace for appetite  NUTRITION DIAGNOSIS:   Inadequate oral intake related to lethargy/confusion, acute illness as evidenced by meal completion < 50%.  GOAL:   Patient will meet greater than or equal to 90% of their needs  MONITOR:   PO intake, I & O's, Weight trends, Supplement acceptance  REASON FOR ASSESSMENT:   Consult Assessment of nutrition requirement/status  ASSESSMENT:   84 year old female who presented with altered mentation from the skilled nursing facility.  She does have the significant past medical history of frontotemporal dementia, hypertension and history of TIAs. Presents with metabolic encephalopathy, apparently is able to ambulate and feed self, relatively independent at baseline.  Consulted to see patient for nutrition assessment.  Patient unable to provide history but this RD spoke with niece Burna Mortimer via phone. She states patient has been declining for 3-4 weeks following her transfer to SNF. Burna Mortimer states patient had a UTI at the end of April but was still eating some, however she is unsure how much. Believes patient hasn't eaten in 1 week.  Reports UBW of 145 pounds, now patient is down to 133 pounds over 3-4 weeks, a 12 pound/8.2% severe weight loss for timeframe.   Patient was receiving ensure at SNF. Patient has also been on an appetite stimulant in the past, Burna Mortimer was interested in trying this again to help with patient's PO intake and weight. Paged MD about this.  Appetite stimulant may improve PO Intake but also may have no affect if decreased PO intake is a result of worsening dementia rather than her acute encephalopathy. Will monitor PO intake.  Unable to diagnose malnutrition at this time, but patient is certainly at risk.  Labs reviewed Medications reviewed  and include:  Lactulose, Ocuvite, Thiamine  NUTRITION - FOCUSED PHYSICAL EXAM:    Most Recent Value  Orbital Region  Mild depletion  Upper Arm Region  No depletion  Thoracic and Lumbar Region  Unable to assess  Buccal Region  Mild depletion  Temple Region  Mild depletion  Clavicle Bone Region  Mild depletion  Clavicle and Acromion Bone Region  No depletion  Scapular Bone Region  Unable to assess  Dorsal Hand  Unable to assess  Patellar Region  Moderate depletion  Anterior Thigh Region  Moderate depletion  Posterior Calf Region  Moderate depletion       Diet Order:   Diet Order           Diet Heart Room service appropriate? Yes; Fluid consistency: Thin  Diet effective now          EDUCATION NEEDS:   Not appropriate for education at this time  Skin:  Skin Assessment: Reviewed RN Assessment  Last BM:  05/15/2017  Height:   Ht Readings from Last 1 Encounters:  05/10/17  (1.702 m)    Weight:   Wt Readings from Last 1 Encounters:  05/11/17 133 lb 13.1 oz (60.7 kg)    Ideal Body Weight:  61.36 kg  BMI:  Body mass index is 20.96 kg/m.  Estimated Nutritional Needs:   Kcal:  1500-1800 calories  Protein:  79-91 grams (1.2-1.5g/kg)  Fluid:  1.5-1.8L  Dionne Ano. Kahliya Fraleigh, MS, RD LDN Inpatient Clinical Dietitian Pager [phone]

## 2017-05-16 NOTE — Progress Notes (Signed)
PROGRESS NOTE    Marissa Chung  WNU:[id number] DOB: 1933/11/24 DOA: 05/10/2017 PCP: System, Pcp Not In   Brief Narrative: Marissa Chung is a 84 y.o. female with a history of frontotemporal dementia, hypertension, TIAs.  She presented from the skilled nursing facility secondary to altered mentation concerning for stroke.  Code stroke was called on admission and was canceled.  Mental status change thought secondary to delirium in setting of dementia.  Urinalysis performed and did not suggest infection.  Patient was treated intermittently with Haldol and has improved significantly.   Assessment & Plan:   Principal Problem:   Acute encephalopathy Active Problems:   Accelerated hypertension   Frontotemporal dementia   Encephalopathy   Acute encephalopathy Appears secondary to delirium in setting of underlying dementia and some possible dehydration.  Improved.  Given IV fluids.  No longer requiring Haldol.  Acute urinary retention Foley placed. -Voiding trial; if fails will obtain repeat urinalysis and urine culture and have patient follow-up with urology as an outpatient  Decreased oral intake Possibly secondary to increased somnolence secondary to iatrogenic effect -Encourage oral intake -Protein supplementation -Palliative care consulted and is discussing with family for goals of care -Discontinue IV fluids   DVT prophylaxis: Lovenox Code Status:   Code Status: Full Code Family Communication: Niece over the telephone Disposition Plan: Discharge to ALF once mental status is improved and is taking oral nutrition better   Consultants:   Neurology  Procedures:   None  Antimicrobials:  None   Subjective: No issues overnight.  Objective: Vitals:   05/15/17 2234 05/16/17 0024 05/16/17 0423 05/16/17 0758  BP: (!) 103/57 (!) 127/58 135/65 135/65  Pulse: 91 86 84 73  Resp: Temp:  98.2 F (36.8 C) 98 F (36.7 C) 98.1 F (36.7 C)  TempSrc:  Oral  Axillary Oral  SpO2: 95% 95% 98% 100%  Weight:      Height:        Intake/Output Summary (Last 24 hours) at 05/16/2017 0929 Last data filed at 05/15/2017 1447 Gross per 24 hour  Intake -  Output 550 ml  Net -550 ml   Filed Weights   05/10/17 1841 05/11/17 0200  Weight: 65.8 kg (145 lb) 60.7 kg (133 lb 13.1 oz)    Examination:  General exam: Appears calm and comfortable Respiratory system: Clear to auscultation. Respiratory effort normal. Cardiovascular system: S1 & S2 heard, RRR. No murmurs, rubs, gallops or clicks. Gastrointestinal system: Abdomen is nondistended, soft and nontender. Normal bowel sounds heard. Central nervous system: Alert and not oriented.  Follows commands.  Extremities: No edema. No calf tenderness Skin: No cyanosis. No rashes Psychiatry: Judgement and insight appear poor. Flat affect    Data Reviewed: I have personally reviewed following labs and imaging studies  CBC: Recent Labs  Lab 05/10/17 1701 05/10/17 1708 05/12/17 0347  WBC 3.9*  --  2.9*  NEUTROABS 1.9  --  1.6  HGB 11.2* 12.6 10.3*  HCT 34.8* 37.0 31.8*  MCV 85.7  --  85.3  PLT 207  --  164   Basic Metabolic Panel: Recent Labs  Lab 05/12/17 0347 05/13/17 0522 05/14/17 0806 05/15/17 0634 05/16/17 0545  NA 143 143 139 142 141  K 3.1* 4.1 3.9 3.8 3.6  CL 111 113* 111 109 106  CO2 26 24 21* 25 27  GLUCOSE 123* 94 94 104* 115*  BUN CREATININE 0.79 0.84 0.71 0.75 0.73  CALCIUM 8.9 8.8* 9.0  9.1 9.1   GFR: Estimated Creatinine Clearance: 52.8 mL/min (by C-G formula based on SCr of 0.73 mg/dL). Liver Function Tests: Recent Labs  Lab 05/10/17 1701  AST 32  ALT 15  ALKPHOS 81  BILITOT 0.3  PROT 7.1  ALBUMIN 3.5   No results for input(s): LIPASE, AMYLASE in the last 168 hours. No results for input(s): AMMONIA in the last 168 hours. Coagulation Profile: Recent Labs  Lab 05/10/17 1701  INR 1.06   Cardiac Enzymes: No results for input(s): CKTOTAL, CKMB,  CKMBINDEX, TROPONINI in the last 168 hours. BNP (last 3 results) No results for input(s): PROBNP in the last 8760 hours. HbA1C: No results for input(s): HGBA1C in the last 72 hours. CBG: Recent Labs  Lab 05/10/17 1700  GLUCAP 106*   Lipid Profile: No results for input(s): CHOL, HDL, LDLCALC, TRIG, CHOLHDL, LDLDIRECT in the last 72 hours. Thyroid Function Tests: No results for input(s): TSH, T4TOTAL, FREET4, T3FREE, THYROIDAB in the last 72 hours. Anemia Panel: No results for input(s): VITAMINB12, FOLATE, FERRITIN, TIBC, IRON, RETICCTPCT in the last 72 hours. Sepsis Labs: No results for input(s): PROCALCITON, LATICACIDVEN in the last 168 hours.  Recent Results (from the past 240 hour(s))  MRSA PCR Screening     Status: None   Collection Time: 05/12/17  1:42 AM  Result Value Ref Range Status   MRSA by PCR NEGATIVE NEGATIVE Final    Comment:        The GeneXpert MRSA Assay (FDA approved for NASAL specimens only), is one component of a comprehensive MRSA colonization surveillance program. It is not intended to diagnose MRSA infection nor to guide or monitor treatment for MRSA infections. Performed at [facility], [address], [place], [place] [address]          Radiology Studies: No results found.      Scheduled Meds: . aspirin EC  81 mg Oral Daily  . enoxaparin (LOVENOX) injection  40 mg Subcutaneous Q24H  . feeding supplement (ENSURE ENLIVE)  237 mL Oral BID BM  . lactulose  20 g Oral BID  . multivitamin  1 tablet Oral Daily  . nystatin ointment  1 application Topical TID  . OLANZapine  2.5 mg Oral QHS  . pravastatin  20 mg Oral q1800  . risperiDONE  0.25 mg Oral Daily  . risperiDONE  0.5 mg Oral QHS  . rivastigmine  9.5 mg Transdermal Daily  . thiamine  100 mg Oral Daily   Continuous Infusions: . dextrose 5 % and 0.45% NaCl 50 mL/hr at 05/15/17 1412     LOS: 4 days     Jacquelin Hawking, MD [facility] 05/16/2017, 9:29 AM Pager:  [phone]  If 7PM-7AM, please contact night-coverage [email or web address] 05/16/2017, 9:29 AM

## 2017-05-16 NOTE — Care Management Note (Signed)
Case Management Note  Patient Details  Name: Marissa Chung MRN: [id number] Date of Birth: [birth year]  Subjective/Objective:                    Action/Plan: Plan is for patient to return to [facility] at d/c. CM following.  Expected Discharge Date:  05/15/17               Expected Discharge Plan:  Assisted Living / Rest Home  In-House Referral:  Clinical Social Work  Discharge planning Services  CM Consult  Post Acute Care Choice:    Choice offered to:     DME Arranged:    DME Agency:     HH Arranged:    HH Agency:     Status of Service:  In process, will continue to follow  If discussed at Long Length of Stay Meetings, dates discussed:    Additional Comments:  Kermit Balo, RN 05/16/2017, 11:46 AM

## 2017-05-16 NOTE — Care Management Important Message (Signed)
Important Message  Patient Details  Name: Marissa Chung MRN: [id number] Date of Birth: 11-Jul-1933   Medicare Important Message Given:  Yes    Adelyn Roscher Stefan Church 05/16/2017, 12:53 PM

## 2017-05-16 NOTE — Progress Notes (Signed)
Daily Progress Note   Patient Name: Marissa Chung       Date: 05/16/2017 DOB: [birth year]  Age: 84 y.o. MRN#: [id number] Attending Physician: Marissa Bonds, MD Primary Care Physician: System, Pcp Not In Admit Date: 05/10/2017  Reason for Consultation/Follow-up: Establishing goals of care  Subjective: Patient awake, alert, confused and irritated while nurse bladder scans her.   Spoke with niece, Marissa Chung via telephone. She spoke with Dr. Caleb Chung this morning and understands [name] will not be discharged today due to mental status, nutritional status and urinary retention. Marissa Chung is hopeful that discontinuing AM dose of Risperdal will decrease her drowsiness. She is hopeful she may be stable for discharge tomorrow. She is relieved to hear physical therapy will follow her at memory care unit.   Marissa Chung is interested in outpatient palliative services to follow at [facility]. I explained that I have added recommendation to my notes but encouraged her to follow-up with providers at nursing facility.   Answered questions and concerns. Emotional support provided.   Length of Stay: 4  Current Medications: Scheduled Meds:  . aspirin EC  81 mg Oral Daily  . enoxaparin (LOVENOX) injection  40 mg Subcutaneous Q24H  . feeding supplement (ENSURE ENLIVE)  237 mL Oral BID BM  . lactulose  20 g Oral BID  . multivitamin  1 tablet Oral Daily  . nystatin ointment  1 application Topical TID  . OLANZapine  2.5 mg Oral QHS  . pravastatin  20 mg Oral q1800  . risperiDONE  0.5 mg Oral QHS  . rivastigmine  9.5 mg Transdermal Daily  . thiamine  100 mg Oral Daily    Continuous Infusions:   PRN Meds: acetaminophen **OR** acetaminophen (TYLENOL) oral liquid 160 mg/5 mL **OR** acetaminophen, hydrALAZINE,  senna-docusate  Physical Exam  HENT:  Head: Normocephalic and atraumatic.  Pulmonary/Chest: No accessory muscle usage. No tachypnea. No respiratory distress.  Abdominal: Normal appearance.  Neurological: She is alert.  Irritated, confused  Skin: Skin is warm and dry.  Psychiatric: Her speech is delayed. Cognition and memory are impaired. She is inattentive.  Nursing note and vitals reviewed.          Vital Signs: BP (!) 161/82 (BP Location: Right Leg)   Pulse (!) 106   Temp 98.3 F (36.8 C) (Oral)   Resp 14  Ht  (1.702 m)   Wt 60.7 kg (133 lb 13.1 oz)   SpO2 100%   BMI 20.96 kg/m  SpO2: SpO2: 100 % O2 Device: O2 Device: Room Air O2 Flow Rate:    Intake/output summary: No intake or output data in the 24 hours ending 05/16/17 1504 LBM: Last BM Date: 05/15/17 Baseline Weight: Weight: 65.8 kg (145 lb) Most recent weight: Weight: 60.7 kg (133 lb 13.1 oz)  Palliative Assessment/Data: PPS 50%   Flowsheet Rows     Most Recent Value  Intake Tab  Referral Department  Hospitalist  Unit at Time of Referral  Med/Surg Unit  Palliative Care Primary Diagnosis  Neurology  Palliative Care Type  New Palliative care  Reason for referral  Clarify Goals of Care  Date first seen by Palliative Care  05/15/17  Clinical Assessment  Palliative Performance Scale Score  50%  Psychosocial & Spiritual Assessment  Palliative Care Outcomes  Patient/Family meeting held?  Yes  Who was at the meeting?  niece  Palliative Care Outcomes  Clarified goals of care, ACP counseling assistance, Provided psychosocial or spiritual support, Linked to palliative care logitudinal support      Patient Active Problem List   Diagnosis Date Noted  . Palliative care by specialist   . Goals of care, counseling/discussion   . Encephalopathy 05/12/2017  . Acute encephalopathy 08/27/2013  . Confusion 08/27/2013  . Accelerated hypertension 07/26/2013  . Frontotemporal dementia 07/26/2013  . Anemia  07/26/2013  . Dyslipidemia 07/26/2013    Palliative Care Assessment & Plan   Patient Profile: 84 y.o. female  with past medical history of frontal lobe dementia, hypertension, shingles, and cataract admitted on 05/10/2017 with altered mental status from memory care. Code stroke initiated in ED. Chest xray negative. Neurology consulted. CT head/MRI negative for acute findings. EEG negative for seizures. Neurology feels overall presentation is most consistent with a hypoactive delirium. Hypokalemia corrected. Receiving IVF and multivitamins including thiamine. Palliative medicine consult for goals of care.   Assessment: Acute encephalopathy Frontotemporal dementia Delirium Acute urinary retention Decreased oral intake   Recommendations/Plan:  FULL code/FULL scope treatment for now. Hard Choices and MOST form left at the bedside for niece, Marissa Chung. Encouraged Marissa Chung to consider GOC and EOL wishes for her aunt with progressive dementia.   Marissa Chung interested in outpatient palliative referral for further GOC discussions and supportive care. SW notified. Also encouraged Marissa Chung to f/u with outpatient provider for palliative referral.   Goals of Care and Additional Recommendations:  Limitations on Scope of Treatment: Full Scope Treatment  Code Status: FULL   Code Status Orders  (From admission, onward)        Start     Ordered   05/11/17 0101  Full code  Continuous     05/11/17 0102    Code Status History    Date Active Date Inactive Code Status Order ID Comments User Context   08/27/2013 1411 08/31/2013 1825 Full Code [id number]  Zannie Cove, MD Inpatient   07/26/2013 1931 07/28/2013 2133 Full Code [id number]  Alison Murray, MD Inpatient       Prognosis:   Unable to determine  Discharge Planning:  To Be Determined  Care plan was discussed with patient, RN, niece Marissa Chung)  Thank you for allowing the Palliative Medicine Team to assist in the care of this patient.   Time In:  1445 Time Out: 1510 Total Time Prolonged Time Billed no      Greater than 50%  of this  time was spent counseling and coordinating care related to the above assessment and plan.  Marissa Homans, FNP-C Palliative Medicine Team  Phone: [phone] Fax: [phone]  Please contact Palliative Medicine Team phone at [phone] for questions and concerns.

## 2017-05-17 DIAGNOSIS — R338 Other retention of urine: Secondary | ICD-10-CM

## 2017-05-17 LAB — CBC
HEMATOCRIT: 33.8 % — AB (ref 36.0–46.0)
Hemoglobin: 10.9 g/dL — ABNORMAL LOW (ref 12.0–15.0)
MCH: 27.7 pg (ref 26.0–34.0)
MCHC: 32.2 g/dL (ref 30.0–36.0)
MCV: 85.8 fL (ref 78.0–100.0)
Platelets: 157 10*3/uL (ref 150–400)
RBC: 3.94 MIL/uL (ref 3.87–5.11)
RDW: 14.5 % (ref 11.5–15.5)
WBC: 4.4 10*3/uL (ref 4.0–10.5)

## 2017-05-17 MED ORDER — LISINOPRIL 20 MG PO TABS
20.0000 mg | ORAL_TABLET | Freq: Every day | ORAL | Status: DC
  Administered 2017-05-17: 20 mg via ORAL
  Filled 2017-05-17: qty 1

## 2017-05-17 MED ORDER — LISINOPRIL 5 MG PO TABS: 5.0000 mg | ORAL_TABLET | Freq: Every day | ORAL | Status: DC

## 2017-05-17 MED ORDER — ORAL CARE MOUTH RINSE
15.0000 mL | Freq: Two times a day (BID) | OROMUCOSAL | Status: DC
  Administered 2017-05-18: 15 mL via OROMUCOSAL

## 2017-05-17 NOTE — Progress Notes (Signed)
PROGRESS NOTE    Marissa Chung  XBJ:[id number] DOB: 1933/02/22 DOA: 05/10/2017 PCP: System, Pcp Not In   Brief Narrative: Marissa Chung is a 84 y.o. female with a history of frontotemporal dementia, hypertension, TIAs.  She presented from the skilled nursing facility secondary to altered mentation concerning for stroke.  Code stroke was called on admission and was canceled.  Mental status change thought secondary to delirium in setting of dementia.  Urinalysis performed and did not suggest infection.  Patient was treated intermittently with Haldol and has improved significantly.   Assessment & Plan:   Principal Problem:   Acute encephalopathy Active Problems:   Accelerated hypertension   Frontotemporal dementia   Encephalopathy   Palliative care by specialist   Goals of care, counseling/discussion   Acute encephalopathy Appears secondary to delirium in setting of underlying dementia and some possible dehydration.  Improved.  Given IV fluids.  No longer requiring Haldol.  Acute urinary retention Appears resolved. Urinating without foley.  Decreased oral intake Dementia Possibly secondary to increased somnolence secondary to iatrogenic effect -Encourage oral intake -Document intake -Protein supplementation -Outpatient palliative care   DVT prophylaxis: Lovenox Code Status:   Code Status: Full Code Family Communication: Niece over the telephone Disposition Plan: Discharge to ALF once mental status is improved and is taking oral nutrition better   Consultants:   Neurology  Palliative care medicine  Procedures:   None  Antimicrobials:  None   Subjective: Afebrile. No issues.  Objective: Vitals:   05/17/17 0237 05/17/17 0509 05/17/17 0751 05/17/17 1200  BP: (!) 153/81 (!) 161/102 102/70 (!) 91/59  Pulse: 85 83 70 79  Resp: 13 (!) Temp: 97.6 F (36.4 C) 98.2 F (36.8 C) 98.4 F (36.9 C) 98.6 F (37 C)  TempSrc: Axillary Axillary Axillary  Axillary  SpO2: 98% 100% 98% 98%  Weight:      Height:        Intake/Output Summary (Last 24 hours) at 05/17/2017 1304 Last data filed at 05/17/2017 1239 Gross per 24 hour  Intake 240 ml  Output -  Net 240 ml   Filed Weights   05/10/17 1841 05/11/17 0200  Weight: 65.8 kg (145 lb) 60.7 kg (133 lb 13.1 oz)    Examination:  General exam: Appears calm and comfortable Respiratory system: Clear to auscultation. Respiratory effort normal. Cardiovascular system: S1 & S2 heard, RRR. No murmurs, rubs, gallops or clicks. Gastrointestinal system: Abdomen is nondistended, soft and nontender. Normal bowel sounds heard. Central nervous system: Alert, not oriented. Extremities: No edema. No calf tenderness Skin: No cyanosis. No rashes Psychiatry: impaired judgment. Flat affect    Data Reviewed: I have personally reviewed following labs and imaging studies  CBC: Recent Labs  Lab 05/10/17 1701 05/10/17 1708 05/12/17 0347 05/17/17 0400  WBC 3.9*  --  2.9* 4.4  NEUTROABS 1.9  --  1.6  --   HGB 11.2* 12.6 10.3* 10.9*  HCT 34.8* 37.0 31.8* 33.8*  MCV 85.7  --  85.3 85.8  PLT 207  --  164 157   Basic Metabolic Panel: Recent Labs  Lab 05/12/17 0347 05/13/17 0522 05/14/17 0806 05/15/17 0634 05/16/17 0545  NA 143 143 139 142 141  K 3.1* 4.1 3.9 3.8 3.6  CL 111 113* 111 109 106  CO2 26 24 21* 25 27  GLUCOSE 123* 94 94 104* 115*  BUN CREATININE 0.79 0.84 0.71 0.75 0.73  CALCIUM 8.9 8.8* 9.0 9.1 9.1  GFR: Estimated Creatinine Clearance: 52.8 mL/min (by C-G formula based on SCr of 0.73 mg/dL). Liver Function Tests: Recent Labs  Lab 05/10/17 1701  AST 32  ALT 15  ALKPHOS 81  BILITOT 0.3  PROT 7.1  ALBUMIN 3.5   No results for input(s): LIPASE, AMYLASE in the last 168 hours. No results for input(s): AMMONIA in the last 168 hours. Coagulation Profile: Recent Labs  Lab 05/10/17 1701  INR 1.06   Cardiac Enzymes: No results for input(s): CKTOTAL, CKMB,  CKMBINDEX, TROPONINI in the last 168 hours. BNP (last 3 results) No results for input(s): PROBNP in the last 8760 hours. HbA1C: No results for input(s): HGBA1C in the last 72 hours. CBG: Recent Labs  Lab 05/10/17 1700  GLUCAP 106*   Lipid Profile: No results for input(s): CHOL, HDL, LDLCALC, TRIG, CHOLHDL, LDLDIRECT in the last 72 hours. Thyroid Function Tests: No results for input(s): TSH, T4TOTAL, FREET4, T3FREE, THYROIDAB in the last 72 hours. Anemia Panel: No results for input(s): VITAMINB12, FOLATE, FERRITIN, TIBC, IRON, RETICCTPCT in the last 72 hours. Sepsis Labs: No results for input(s): PROCALCITON, LATICACIDVEN in the last 168 hours.  Recent Results (from the past 240 hour(s))  MRSA PCR Screening     Status: None   Collection Time: 05/12/17  1:42 AM  Result Value Ref Range Status   MRSA by PCR NEGATIVE NEGATIVE Final    Comment:        The GeneXpert MRSA Assay (FDA approved for NASAL specimens only), is one component of a comprehensive MRSA colonization surveillance program. It is not intended to diagnose MRSA infection nor to guide or monitor treatment for MRSA infections. Performed at [facility], [address] [place], [place] [address]          Radiology Studies: No results found.      Scheduled Meds: . aspirin EC  81 mg Oral Daily  . enoxaparin (LOVENOX) injection  40 mg Subcutaneous Q24H  . feeding supplement (ENSURE ENLIVE)  237 mL Oral BID BM  . lactulose  20 g Oral BID  . lisinopril  20 mg Oral Daily  . megestrol  400 mg Oral Daily  . multivitamin  1 tablet Oral Daily  . nystatin ointment  1 application Topical TID  . OLANZapine  2.5 mg Oral QHS  . pravastatin  20 mg Oral q1800  . risperiDONE  0.5 mg Oral QHS  . rivastigmine  9.5 mg Transdermal Daily  . thiamine  100 mg Oral Daily   Continuous Infusions:    LOS: 5 days     Jacquelin Hawking, MD [facility] 05/17/2017, 1:04 PM Pager: [phone]  If 7PM-7AM,  please contact night-coverage [email or web address] 05/17/2017, 1:04 PM

## 2017-05-17 NOTE — Progress Notes (Addendum)
1930 Bedside shift report, pt resting comfortably in bed, NAD, mittens on bilateral hands. Fall precautions and tele sitter in place. WCTM  2000 Pt assessed, see flow sheet. A&Ox0, unable to tell RN name, p[lace, situation, ect. Able to follow some commands at times. Fall precautions in place, [facility].   2115 Pt medicated to [facility], able to take pills crushed with some coaxing. Pt bathed and full linen change. Tolerated well. WCTM  2300 Pt sleeping soundly, an occasional moan heard while pt is sleeping, NAD, WCTM.

## 2017-05-17 NOTE — Progress Notes (Signed)
Physical Therapy Treatment Patient Details Name: Marissa Chung MRN: [id number] DOB: 1933/11/23 Today's Date: 05/17/2017    History of Present Illness Patient is an 84 yo female who presents with metabolic encephalopathy with delirium in the setting of frontotemporal dementia.    PT Comments    Patient is making progress toward mobility goals and able to ambulate short distance in room with +2 assist. Pt required less assist for bed mobility this session and following simple cues for tasks. Continue to progress as tolerated with anticipated d/c to SNF for further skilled PT services.    Follow Up Recommendations  SNF     Equipment Recommendations  None recommended by PT    Recommendations for Other Services       Precautions / Restrictions Precautions Precautions: Fall Restrictions Weight Bearing Restrictions: No    Mobility  Bed Mobility Overal bed mobility: Needs Assistance Bed Mobility: Supine to Sit     Supine to sit: Min assist     General bed mobility comments: HHA to elevate trunk into sitting  Transfers Overall transfer level: Needs assistance Equipment used: 2 person hand held assist Transfers: Sit to/from Stand Sit to Stand: Mod assist;+2 physical assistance;From elevated surface         General transfer comment: assist to power up into standing and gain balance upon standing  Ambulation/Gait Ambulation/Gait assistance: Max assist;Mod assist;+2 physical assistance Ambulation Distance (Feet): 20 Feet Assistive device: 2 person hand held assist Gait Pattern/deviations: Step-through pattern;Decreased step length - right;Decreased step length - left;Decreased dorsiflexion - right;Decreased dorsiflexion - left;Trunk flexed;Narrow base of support     General Gait Details: multimodal cues for posture and sequencing of gait; +2 assist with one person in front of pt with bilat HHA and one person facilitating movement at hips; pt ambulated to door in room and  then began attempting to sit and required increased assist for maintaining standing and ultimately agreeable to ambulate from door to recliner   Stairs             Wheelchair Mobility    Modified Rankin (Stroke Patients Only)       Balance Overall balance assessment: Needs assistance Sitting-balance support: Feet supported Sitting balance-Leahy Scale: Fair Sitting balance - Comments: pt able to lean outside BOS and pull up socks   Standing balance support: Bilateral upper extremity supported Standing balance-Leahy Scale: Poor Standing balance comment: posterior bias initially                            Cognition Arousal/Alertness: Awake/alert Behavior During Therapy: Flat affect Overall Cognitive Status: History of cognitive impairments - at baseline                                 General Comments: pt responded well to simple commands      Exercises      General Comments        Pertinent Vitals/Pain Faces Pain Scale: No hurt    Home Living                      Prior Function            PT Goals (current goals can now be found in the care plan section) Acute Rehab PT Goals Patient Stated Goal: unable to state PT Goal Formulation: With patient Time For Goal Achievement: 05/28/17 Potential to Achieve  Goals: Poor Progress towards PT goals: Progressing toward goals    Frequency    Min 2X/week      PT Plan Current plan remains appropriate    Co-evaluation              AM-PAC PT "6 Clicks" Daily Activity  Outcome Measure  Difficulty turning over in bed (including adjusting bedclothes, sheets and blankets)?: A Lot Difficulty moving from lying on back to sitting on the side of the bed? : Unable Difficulty sitting down on and standing up from a chair with arms (e.g., wheelchair, bedside commode, etc,.)?: Unable Help needed moving to and from a bed to chair (including a wheelchair)?: A Lot Help needed  walking in hospital room?: A Lot Help needed climbing 3-5 steps with a railing? : Total 6 Click Score: 9    End of Session Equipment Utilized During Treatment: Gait belt Activity Tolerance: Patient tolerated treatment well Patient left: with call bell/phone within reach;in chair;with chair alarm set;with restraints reapplied(bilat mittens) Nurse Communication: Mobility status PT Visit Diagnosis: Other symptoms and signs involving the nervous system (R29.898);Difficulty in walking, not elsewhere classified (R26.2)     Time: 0950-1020 PT Time Calculation (min) (ACUTE ONLY): 30 min  Charges:  $Gait Training: 8-22 mins $Therapeutic Activity: 8-22 mins                    G Codes:       Erline Levine, PTA Pager: [phone]     Carolynne Edouard 05/17/2017, 11:22 AM

## 2017-05-18 LAB — CREATININE, SERUM
CREATININE: 0.85 mg/dL (ref 0.44–1.00)
GFR calc Af Amer: >60 mL/min (ref >60)

## 2017-05-18 MED ORDER — LISINOPRIL 10 MG PO TABS
10.0000 mg | ORAL_TABLET | Freq: Every day | ORAL | Status: DC
  Administered 2017-05-18: 10 mg via ORAL
  Filled 2017-05-18: qty 1

## 2017-05-18 MED ORDER — LISINOPRIL 10 MG PO TABS: 10.0000 mg | ORAL_TABLET | Freq: Every day | ORAL | 0 refills | Status: DC

## 2017-05-18 MED ORDER — MEGESTROL ACETATE 400 MG/10ML PO SUSP: 400.0000 mg | Freq: Every day | ORAL | 0 refills | Status: AC

## 2017-05-18 MED ORDER — RISPERIDONE 0.5 MG PO TABS: 0.5000 mg | ORAL_TABLET | Freq: Every day | ORAL | 0 refills | Status: AC

## 2017-05-18 MED ORDER — ASPIRIN 81 MG PO TBEC: 81.0000 mg | DELAYED_RELEASE_TABLET | Freq: Every day | ORAL | 0 refills | Status: AC

## 2017-05-18 NOTE — NC FL2 (Signed)
[place] MEDICAID FL2 LEVEL OF CARE SCREENING TOOL     IDENTIFICATION  Patient Name: Marissa Chung Birthdate: 1933/12/24 Sex: female Admission Date (Current Location): 05/10/2017  [facility] and [place] Number:  Producer, television/film/video and Address:  [facility], [address], [place], [place] [address]      Provider Number: [id number]  Attending Physician Name and Address:  Narda Bonds, MD  Relative Name and Phone Number:       Current Level of Care: Hospital Recommended Level of Care: Memory Care Prior Approval Number:    Date Approved/Denied:   PASRR Number:    Discharge Plan: Other (Comment)(Memory Care)    Current Diagnoses: Patient Active Problem List   Diagnosis Date Noted  . Palliative care by specialist   . Goals of care, counseling/discussion   . Encephalopathy 05/12/2017  . Acute encephalopathy 08/27/2013  . Confusion 08/27/2013  . Accelerated hypertension 07/26/2013  . Frontotemporal dementia 07/26/2013  . Anemia 07/26/2013  . Dyslipidemia 07/26/2013    Orientation RESPIRATION BLADDER Height & Weight     (not oriented)  Normal Incontinent Weight: 133 lb 13.1 oz (60.7 kg) Height:   (170.2 cm)  BEHAVIORAL SYMPTOMS/MOOD NEUROLOGICAL BOWEL NUTRITION STATUS      Incontinent Diet(heart healthy)  AMBULATORY STATUS COMMUNICATION OF NEEDS Skin   Extensive Assist Verbally Normal                       Personal Care Assistance Level of Assistance  Bathing, Feeding, Dressing Bathing Assistance: Maximum assistance Feeding assistance: Maximum assistance Dressing Assistance: Maximum assistance     Functional Limitations Info  Sight, Hearing, Speech Sight Info: Adequate Hearing Info: Adequate Speech Info: Adequate    SPECIAL CARE FACTORS FREQUENCY  PT (By licensed PT), OT (By licensed OT), Speech therapy     PT Frequency: 3x/wk with home health OT Frequency: 3x/wk with home health     Speech Therapy Frequency:  3x/wk with home health      Contractures Contractures Info: Not present    Additional Factors Info  Allergies, Psychotropic, Code Status Code Status Info: Full Allergies Info: Shellfish Allergy Psychotropic Info: Zyprexa 2.5mg  daily at bed; Risperdal 0.5 mg daily at bed         Current Medications (05/18/2017):  This is the current hospital active medication list Current Facility-Administered Medications  Medication Dose Route Frequency Provider Last Rate Last Dose  . acetaminophen (TYLENOL) tablet 650 mg  650 mg Oral Q4H PRN Opyd, Lavone Neri, MD       Or  . acetaminophen (TYLENOL) solution 650 mg  650 mg Per Tube Q4H PRN Opyd, Lavone Neri, MD       Or  . acetaminophen (TYLENOL) suppository 650 mg  650 mg Rectal Q4H PRN Opyd, Lavone Neri, MD      . aspirin EC tablet 81 mg  81 mg Oral Daily Arrien, York Ram, MD   81 mg at 05/18/17 1022  . enoxaparin (LOVENOX) injection 40 mg  40 mg Subcutaneous Q24H Opyd, Lavone Neri, MD   40 mg at 05/18/17 1022  . feeding supplement (ENSURE ENLIVE) (ENSURE ENLIVE) liquid 237 mL  237 mL Oral BID BM Opyd, Lavone Neri, MD   237 mL at 05/16/17 1704  . hydrALAZINE (APRESOLINE) injection 10 mg  10 mg Intravenous Q6H PRN Bodenheimer, Charles A, NP   10 mg at 05/15/17 2134  . lactulose (CHRONULAC) 10 GM/15ML solution 20 g  20 g Oral  BID Briscoe Deutscher, MD   20 g at 05/18/17 1042  . lisinopril (PRINIVIL,ZESTRIL) tablet 10 mg  10 mg Oral Daily Narda Bonds, MD   10 mg at 05/18/17 1022  . MEDLINE mouth rinse  15 mL Mouth Rinse BID Narda Bonds, MD      . megestrol (MEGACE) 400 MG/10ML suspension 400 mg  400 mg Oral Daily Narda Bonds, MD   400 mg at 05/18/17 1022  . multivitamin (PROSIGHT) tablet 1 tablet  1 tablet Oral Daily Arrien, York Ram, MD   1 tablet at 05/18/17 1022  . nystatin ointment (MYCOSTATIN) 1 application  1 application Topical TID Briscoe Deutscher, MD   Stopped at 05/18/17 1042  . OLANZapine (ZYPREXA) tablet 2.5 mg  2.5 mg Oral QHS  Opyd, Lavone Neri, MD   2.5 mg at 05/17/17 2117  . pravastatin (PRAVACHOL) tablet 20 mg  20 mg Oral q1800 Opyd, Lavone Neri, MD   20 mg at 05/17/17 1827  . risperiDONE (RISPERDAL) tablet 0.5 mg  0.5 mg Oral QHS Opyd, Lavone Neri, MD   0.5 mg at 05/17/17 2117  . rivastigmine (EXELON) 9.5 mg/24hr 9.5 mg  9.5 mg Transdermal Daily Opyd, Lavone Neri, MD   9.5 mg at 05/18/17 1022  . senna-docusate (Senokot-S) tablet 1 tablet  1 tablet Oral QHS PRN Opyd, Lavone Neri, MD      . thiamine (VITAMIN B-1) tablet 100 mg  100 mg Oral Daily Arrien, York Ram, MD   100 mg at 05/18/17 1022     Discharge Medications: Please see discharge summary for a list of discharge medications.  Relevant Imaging Results:  Relevant Lab Results:   Additional Information SS#: [id number]; Will need palliative medicine set up to follow at discharge  Baldemar Lenis, LCSW

## 2017-05-18 NOTE — Progress Notes (Addendum)
0000 Pt sleeping, easy to arouse, pt confused, not following commands. Withdrawals to pain. Tele sitter in place, fall precautions intact, WCTM.   0415 Pt sleeping, easy to arouse, pt confused, followed simple commands, reoriented. 50ml in container from purewick. Pt bladder scanned, 141cc. Pt repositioned, NAD, VSS, fall precautions in place, tele sitter camera in use, WCTM.  0645 Pt sleeping soundly, NAD, tele sitter in place, fall precautions in tact, awaiting to give shift report to the oncoming RN.

## 2017-05-18 NOTE — Care Management Note (Signed)
Case Management Note  Patient Details  Name: Marissa Chung MRN: [id number] Date of Birth: May 31, 1933  Subjective/Objective:                    Action/Plan: Pt discharging back to [facility] today with orders for [facility] services. Pt was active with [facility] prior to admission at [facility]. CM called [facility] and informed them of d/c and that therapies reordered for the patient.  CSW arranging d/c to [facility] and transportation to facility. No further needs per CM.  Expected Discharge Date:  05/15/17               Expected Discharge Plan:  Assisted Living / Rest Home  In-House Referral:  Clinical Social Work  Discharge planning Services  CM Consult  Post Acute Care Choice:  Home Health Choice offered to:     DME Arranged:    DME Agency:     HH Arranged:  PT, OT, Speech Therapy HH Agency:  [facility]  Status of Service:  Completed, signed off  If discussed at Long Length of Stay Meetings, dates discussed:    Additional Comments:  Kermit Balo, RN 05/18/2017, 10:16 AM

## 2017-05-18 NOTE — Progress Notes (Signed)
Discharge to: [facility] Anticipated discharge date: 05/18/17 Family notified: Yes, by phone Transportation by: [facility]  Report #: [phone]  CSW signing off.  Blenda Nicely LCSW [phone]

## 2017-05-18 NOTE — Progress Notes (Signed)
After rechecking blood pressure on patient around 1530, RN informed MD and MD put in discharge orders. Tresa Endo, CM stated that she would contact transport since pt needed something done before 1700. CM called transport around 1600. Transport arrived at 1800 to take patient back to facility and rechecked vitals. RN called to give report when person on phone informed RN that pt needed to be seen in person by head individual at facility and that someone was told if patient arrived after 1700, would not be able to return. Person on phone stated that she would call the head individual after patient arrived and would probably have to send patient back to hospital. RN asked if someone else might be able to perform service so patient would not have to return to hospital, which person on phone stated no. After phone call, RN informed Charge Nurse Kennith Center of situation and pt's bed was prevented from being emptied, just in case.

## 2017-05-18 NOTE — Progress Notes (Signed)
Pt being discharged from hospital per orders from MD back to memory care unit. All questions and concerns were addressed. Pt's IV was removed prior to discharge. Pt exited hospital via stretcher with medical transport.

## 2017-05-18 NOTE — Progress Notes (Signed)
Pt appeared dazed and disoriented after standing and sitting to get onto bedside commode and again after pt stood to get back into bed. MD updated on information and blood pressure taken. Will continue to monitor.

## 2017-07-13 ENCOUNTER — Encounter (HOSPITAL_COMMUNITY): Payer: Self-pay | Admitting: Emergency Medicine

## 2017-07-13 ENCOUNTER — Other Ambulatory Visit: Payer: Self-pay

## 2017-07-13 ENCOUNTER — Emergency Department (HOSPITAL_COMMUNITY)
Admission: EM | Admit: 2017-07-13 | Discharge: 2017-07-13 | Disposition: A | Discharge status: Home / Self Care | Payer: Medicare Other | Attending: Emergency Medicine | Admitting: Emergency Medicine

## 2017-07-13 DIAGNOSIS — F0390 Unspecified dementia without behavioral disturbance: Secondary | ICD-10-CM | POA: Diagnosis not present

## 2017-07-13 DIAGNOSIS — R040 Epistaxis: Secondary | ICD-10-CM | POA: Diagnosis not present

## 2017-07-13 DIAGNOSIS — Z87891 Personal history of nicotine dependence: Secondary | ICD-10-CM | POA: Insufficient documentation

## 2017-07-13 DIAGNOSIS — Z7982 Long term (current) use of aspirin: Secondary | ICD-10-CM | POA: Insufficient documentation

## 2017-07-13 DIAGNOSIS — Z79899 Other long term (current) drug therapy: Secondary | ICD-10-CM | POA: Diagnosis not present

## 2017-07-13 DIAGNOSIS — I10 Essential (primary) hypertension: Secondary | ICD-10-CM | POA: Insufficient documentation

## 2017-07-13 MED ORDER — TRIPLE ANTIBIOTIC 3.5-400-5000 EX OINT
1.0000 "application " | TOPICAL_OINTMENT | Freq: Once | CUTANEOUS | Status: DC | PRN
  Filled 2017-07-13: qty 1

## 2017-07-13 MED ORDER — LIDOCAINE HCL 4 % EX SOLN
Freq: Once | CUTANEOUS | Status: AC
  Administered 2017-07-13: 09:00:00 via TOPICAL
  Filled 2017-07-13: qty 50

## 2017-07-13 MED ORDER — OXYMETAZOLINE HCL 0.05 % NA SOLN
1.0000 | Freq: Once | NASAL | Status: AC
  Administered 2017-07-13: 1 via NASAL
  Filled 2017-07-13: qty 15

## 2017-07-13 MED ORDER — SILVER NITRATE-POT NITRATE 75-25 % EX MISC
1.0000 | Freq: Once | CUTANEOUS | Status: AC | PRN
  Administered 2017-07-13: 1 via TOPICAL

## 2017-07-13 NOTE — ED Provider Notes (Signed)
[facility]-EMERGENCY DEPT Provider Note   CSN: [id number] Arrival date & time: 07/13/17  0747     History   Chief Complaint Chief Complaint  Patient presents with  . Epistaxis    HPI [name] is a 84 y.o. female.  HPI Patient presented to the emergency room for evaluation of a nosebleed.  According to the EMS report the patient resides in a nursing facility.  They noticed that she had a nosebleed yesterday that resolved.  She had another one this morning and was sent to the ED.  Patient herself does not know why she is here and denies having any complaints. Past Medical History:  Diagnosis Date  . Cataract    BILATERAL  . Frontal lobe dementia   . History of shingles   . Hypertension     Patient Active Problem List   Diagnosis Date Noted  . Palliative care by specialist   . Goals of care, counseling/discussion   . Encephalopathy 05/12/2017  . Acute encephalopathy 08/27/2013  . Confusion 08/27/2013  . Accelerated hypertension 07/26/2013  . Frontotemporal dementia 07/26/2013  . Anemia 07/26/2013  . Dyslipidemia 07/26/2013    Past Surgical History:  Procedure Laterality Date  . FINGER SURGERY Right    pinky finger     OB History   None      Home Medications    Prior to Admission medications   Medication Sig Start Date End Date Taking? Authorizing Provider  acetaminophen (TYLENOL) 650 MG CR tablet Take 650 mg by mouth daily.   Yes [provider]  Chloroxylenol-Zinc Oxide (BAZA EX) Apply 1 application topically See admin instructions. Apply to skin 3x/day as needed. Apply to sacral area with each incontinence episode.   Yes [provider]  Feeding Supplies MISC Take 1 Can by mouth 3 (three) times daily.   Yes [provider]  levothyroxine (SYNTHROID, LEVOTHROID) 25 MCG tablet Take 25 mcg by mouth daily before breakfast.   Yes [provider]  lisinopril (PRINIVIL,ZESTRIL) 10 MG tablet Take 10  mg by mouth daily.   Yes [provider]  OLANZapine zydis (ZYPREXA) 5 MG disintegrating tablet Take 5 mg by mouth at bedtime.   Yes [provider]  Polyethyl Glycol-Propyl Glycol (SYSTANE) 0.4-0.3 % SOLN Place 1 drop into both eyes 3 (three) times daily.   Yes [provider]  aspirin EC 81 MG EC tablet Take 1 tablet (81 mg total) by mouth daily. 05/19/17   Narda BondsNettey, Ralph A, MD  lactulose (CHRONULAC) 10 GM/15ML solution Take 20 g by mouth 2 (two) times daily.    [provider]  megestrol (MEGACE) 400 MG/10ML suspension Take 10 mLs (400 mg total) by mouth daily. Patient not taking: Reported on 07/15/2017 05/19/17   Narda BondsNettey, Ralph A, MD  risperiDONE (RISPERDAL) 0.5 MG tablet Take 1 tablet (0.5 mg total) by mouth at bedtime. Patient not taking: Reported on 07/15/2017 05/18/17   Narda BondsNettey, Ralph A, MD  rivastigmine (EXELON) 9.5 mg/24hr Place 9.5 mg onto the skin daily.    [provider]    Family History No family history on file.  Social History Social History   Tobacco Use  . Smoking status: Former Games developermoker  . Tobacco comment: quit smoking over 20 years ago   Substance Use Topics  . Alcohol use: No  . Drug use: No     Allergies   Shellfish-derived products   Review of Systems Review of Systems  All other systems reviewed and are  negative.    Physical Exam Updated Vital Signs BP 140/74   Pulse 73   Temp 97.6 F (36.4 C) (Oral)   Resp 18   SpO2 100%   Physical Exam  Constitutional: She appears well-developed and well-nourished. No distress.  HENT:  Head: Normocephalic and atraumatic.  Right Ear: External ear normal.  Left Ear: External ear normal.  Small amount of dried blood noted around the left nares, small amount of blood noted in the anterior aspect of the nasal septum on the left side, no active bleeding, no clots  Eyes: Conjunctivae are normal. Right eye exhibits no discharge. Left eye exhibits no discharge. No scleral  icterus.  Neck: Neck supple. No tracheal deviation present.  Cardiovascular: Normal rate, regular rhythm and intact distal pulses.  Pulmonary/Chest: Effort normal and breath sounds normal. No stridor. No respiratory distress. She has no wheezes. She has no rales.  Abdominal: Soft. Bowel sounds are normal. She exhibits no distension. There is no tenderness. There is no rebound and no guarding.  Musculoskeletal: She exhibits no edema or tenderness.  Neurological: She is alert. She has normal strength. No cranial nerve deficit (no facial droop, extraocular movements intact, no slurred speech) or sensory deficit. She exhibits normal muscle tone. She displays no seizure activity. Coordination normal.  Skin: Skin is warm and dry. No rash noted.  No signs of petechiae or purpura  Psychiatric: She has a normal mood and affect.  Nursing note and vitals reviewed.    ED Treatments / Results  Labs (all labs ordered are listed, but only abnormal results are displayed) Labs Reviewed - No data to display  EKG None  Radiology No results found.  Procedures .Epistaxis Management Date/Time: 07/15/2017 4:55 PM Performed by: Linwood Dibbles, MD Authorized by: Linwood Dibbles, MD   Consent:    Consent obtained:  Verbal   Consent given by:  Patient   Risks discussed:  Bleeding, infection and nasal injury Anesthesia (see MAR for exact dosages):    Anesthesia method:  Topical application   Topical anesthetic:  Lidocaine gel Procedure details:    Treatment site:  L anterior   Treatment method:  Silver nitrate   Treatment complexity:  Limited   (including critical care time)  Medications Ordered in ED Medications  oxymetazoline (AFRIN) 0.05 % nasal spray 1 spray (1 spray Each Nare Given by Other 07/13/17 0850)  lidocaine (XYLOCAINE) 4 % external solution ( Topical Given by Other 07/13/17 0851)  silver nitrate applicators applicator 1 Stick (1 Stick Topical Given by Other 07/13/17 1016)     Initial  Impression / Assessment and Plan / ED Course  I have reviewed the triage vital signs and the nursing notes.  Pertinent labs & imaging results that were available during my care of the patient were reviewed by me and considered in my medical decision making (see chart for details).    Patient presented to the emergency room for evaluation of a nosebleed.  No active bleeding noted in the ED.  Patient appears to have a small area of focus in the left anterior nares.  Silver nitrate applied.  Patient tolerated well.   Final Clinical Impressions(s) / ED Diagnoses   Final diagnoses:  Left-sided epistaxis    ED Discharge Orders    None       Linwood Dibbles, MD 07/15/17 1655

## 2017-07-13 NOTE — Discharge Instructions (Addendum)
Avoid scratching your nose, if the bleeding restarts pinch front of your nose for 15 minutes, return to the emergency room if the bleeding does not resolve

## 2017-07-13 NOTE — ED Triage Notes (Signed)
[facility] for a nose bleed that had resolved before arrival. Per EMS, nursing home reported pt had nose bleed yesterday also that resolved on it's own.

## 2017-07-13 NOTE — ED Notes (Signed)
Bed: WA17 Expected date:  Expected time:  Means of arrival:  Comments: EMS/nosebleed 

## 2017-07-15 ENCOUNTER — Other Ambulatory Visit: Payer: Self-pay

## 2017-07-15 ENCOUNTER — Encounter (HOSPITAL_COMMUNITY): Payer: Self-pay

## 2017-07-15 ENCOUNTER — Emergency Department (HOSPITAL_COMMUNITY)
Admission: EM | Admit: 2017-07-15 | Discharge: 2017-07-15 | Disposition: A | Discharge status: Skilled Nursing Facility | Payer: Medicare Other | Attending: Emergency Medicine | Admitting: Emergency Medicine

## 2017-07-15 DIAGNOSIS — F0390 Unspecified dementia without behavioral disturbance: Secondary | ICD-10-CM | POA: Insufficient documentation

## 2017-07-15 DIAGNOSIS — S0990XA Unspecified injury of head, initial encounter: Secondary | ICD-10-CM | POA: Diagnosis present

## 2017-07-15 DIAGNOSIS — Y999 Unspecified external cause status: Secondary | ICD-10-CM | POA: Insufficient documentation

## 2017-07-15 DIAGNOSIS — Z8659 Personal history of other mental and behavioral disorders: Secondary | ICD-10-CM

## 2017-07-15 DIAGNOSIS — I10 Essential (primary) hypertension: Secondary | ICD-10-CM | POA: Diagnosis not present

## 2017-07-15 DIAGNOSIS — Z87891 Personal history of nicotine dependence: Secondary | ICD-10-CM | POA: Diagnosis not present

## 2017-07-15 DIAGNOSIS — Y939 Activity, unspecified: Secondary | ICD-10-CM | POA: Insufficient documentation

## 2017-07-15 DIAGNOSIS — S0083XA Contusion of other part of head, initial encounter: Secondary | ICD-10-CM | POA: Diagnosis not present

## 2017-07-15 DIAGNOSIS — W19XXXA Unspecified fall, initial encounter: Secondary | ICD-10-CM | POA: Diagnosis not present

## 2017-07-15 DIAGNOSIS — Y929 Unspecified place or not applicable: Secondary | ICD-10-CM | POA: Diagnosis not present

## 2017-07-15 DIAGNOSIS — Z7982 Long term (current) use of aspirin: Secondary | ICD-10-CM | POA: Diagnosis not present

## 2017-07-15 DIAGNOSIS — Z79899 Other long term (current) drug therapy: Secondary | ICD-10-CM | POA: Insufficient documentation

## 2017-07-15 NOTE — ED Triage Notes (Signed)
Per EMS [facility]. Staff reports unwitnessed fall today. Hx of dementia. Staff reports pt is at baseline. Hematoma noted to left forehead. Reports no blood thinners. A & O x1.

## 2017-07-15 NOTE — ED Notes (Signed)
Patient's niece at bedside

## 2017-07-15 NOTE — ED Provider Notes (Signed)
[facility]-EMERGENCY DEPT Provider Note   CSN: [id number] Arrival date & time: 07/15/17  1108     History   Chief Complaint Chief Complaint  Patient presents with  . Fall  . Head Injury    HPI Marissa Chung is a 84 y.o. female.  [facility]. Pt with hx advanced dementia, s/p fall. No loc noted. Post fall patients mental status reported as c/w baseline. No vomiting. Pt limited historian, dementia - level 5 caveat. No anticoagulant use. No fevers reported. Skin intact.   The history is provided by the patient and the EMS personnel. The history is limited by the condition of the patient.  Fall   Head Injury   Pertinent negatives include no vomiting.    Past Medical History:  Diagnosis Date  . Cataract    BILATERAL  . Frontal lobe dementia   . History of shingles   . Hypertension     Patient Active Problem List   Diagnosis Date Noted  . Palliative care by specialist   . Goals of care, counseling/discussion   . Encephalopathy 05/12/2017  . Acute encephalopathy 08/27/2013  . Confusion 08/27/2013  . Accelerated hypertension 07/26/2013  . Frontotemporal dementia 07/26/2013  . Anemia 07/26/2013  . Dyslipidemia 07/26/2013    Past Surgical History:  Procedure Laterality Date  . FINGER SURGERY Right    pinky finger     OB History   None      Home Medications    Prior to Admission medications   Medication Sig Start Date End Date Taking? Authorizing Provider  acetaminophen (TYLENOL) 650 MG CR tablet Take 650 mg by mouth daily.    [provider]  aspirin EC 81 MG EC tablet Take 1 tablet (81 mg total) by mouth daily. 05/19/17   Narda BondsNettey, Ralph A, MD  aspirin EC 81 MG tablet Take 81 mg by mouth daily.    [provider]  Chloroxylenol-Zinc Oxide (BAZA EX) Apply 1 application topically 3 (three) times daily as needed.    [provider]  feeding supplement, ENSURE COMPLETE, (ENSURE COMPLETE) LIQD  Take 237 mLs by mouth 2 (two) times daily between meals. 07/28/13   Dhungel, Theda BelfastNishant, MD  Feeding Supplies MISC Take 1 Can by mouth 3 (three) times daily.    [provider]  lactulose (CHRONULAC) 10 GM/15ML solution Take 20 g by mouth 2 (two) times daily.    [provider]  lactulose (CHRONULAC) 10 GM/15ML solution Take 20 g by mouth 2 (two) times daily.    [provider]  levothyroxine (SYNTHROID, LEVOTHROID) 25 MCG tablet Take 25 mcg by mouth daily before breakfast.    [provider]  lisinopril (PRINIVIL,ZESTRIL) 10 MG tablet Take 10 mg by mouth daily.    [provider]  megestrol (MEGACE) 400 MG/10ML suspension Take 10 mLs (400 mg total) by mouth daily. 05/19/17   Narda BondsNettey, Ralph A, MD  nystatin ointment (MYCOSTATIN) Apply 1 application topically 3 (three) times daily. To lower back    [provider]  OLANZapine (ZYPREXA) 2.5 MG tablet Take 2.5 mg by mouth at bedtime.    [provider]  OLANZapine zydis (ZYPREXA) 5 MG disintegrating tablet Take 5 mg by mouth at bedtime.    [provider]  Polyethyl Glycol-Propyl Glycol (SYSTANE) 0.4-0.3 % SOLN Place 1 drop into both eyes 3 (three) times daily.    [provider]  pravastatin (PRAVACHOL) 20 MG tablet Take 1 tablet (20 mg total)  by mouth daily at 6 PM. 03/12/16   Marvel Plan, MD  risperiDONE (RISPERDAL) 0.5 MG tablet Take 1 tablet (0.5 mg total) by mouth at bedtime. 05/18/17   Narda Bonds, MD  rivastigmine (EXELON) 9.5 mg/24hr Place 9.5 mg onto the skin daily.    [provider]  rivastigmine (EXELON) 9.5 mg/24hr Place 9.5 mg onto the skin daily.    [provider]    Family History No family history on file.  Social History Social History   Tobacco Use  . Smoking status: Former Games developer  . Tobacco comment: quit smoking over 20 years ago   Substance Use Topics  . Alcohol use: No  . Drug use: No     Allergies   Shellfish allergy  and Shellfish-derived products   Review of Systems Review of Systems  Unable to perform ROS: Dementia  Constitutional: Negative for fever.  Gastrointestinal: Negative for vomiting.  Skin: Negative for wound.  level 5 caveat, dementia.   Physical Exam Updated Vital Signs BP (!) 152/78 (BP Location: Left Arm)   Pulse 73   Temp 98.4 F (36.9 C) (Oral)   Resp 18   SpO2 94%   Physical Exam  Constitutional: She appears well-developed and well-nourished.  HENT:  Contusion to left forehead. Facial bones/orbits grossly intact.   Eyes: Pupils are equal, round, and reactive to light. Conjunctivae are normal. No scleral icterus.  Neck: Normal range of motion. Neck supple. No tracheal deviation present.  Cardiovascular: Normal rate, regular rhythm, normal heart sounds and intact distal pulses.  Pulmonary/Chest: Effort normal and breath sounds normal. No respiratory distress. She exhibits no tenderness.  Abdominal: Soft. Normal appearance. She exhibits no distension. There is no tenderness.  Musculoskeletal: She exhibits no edema.  CTLS spine, non tender, aligned, no step off. Good rom bil extremities without pain or focal bony tenderness.   Neurological: She is alert.  Awake and alert. Moves bil extremities purposefully with good strength. Dementia/confusion, speech clear - mental status described as c/w baseline.   Skin: Skin is warm and dry. No rash noted.  Psychiatric: She has a normal mood and affect.  Nursing note and vitals reviewed.    ED Treatments / Results  Labs (all labs ordered are listed, but only abnormal results are displayed) Labs Reviewed - No data to display  EKG None  Radiology No results found.  Procedures Procedures (including critical care time)  Medications Ordered in ED Medications - No data to display   Initial Impression / Assessment and Plan / ED Course  I have reviewed the triage vital signs and the nursing notes.  Pertinent labs & imaging  results that were available during my care of the patient were reviewed by me and considered in my medical decision making (see chart for details).  Ice/coldpack to forehead contusion.   Ambulated with assist.   Observe in ED. Po fluids.   Reviewed nursing notes and prior charts for additional history.   Recheck remains awake, alert, content. Pt current appears stable for d/c.     Final Clinical Impressions(s) / ED Diagnoses   Final diagnoses:  None    ED Discharge Orders    None       Cathren Laine, MD 07/15/17 1232

## 2017-07-15 NOTE — ED Notes (Signed)
[facility] called for transport.  

## 2017-07-15 NOTE — ED Notes (Signed)
Bed: [id number] Expected date: 07/15/17 Expected time: 11:13 AM Means of arrival: Ambulance Comments: Fall with dementia

## 2017-07-15 NOTE — ED Notes (Signed)
Patient provided with ice pack

## 2017-07-15 NOTE — Discharge Instructions (Addendum)
It was our pleasure to provide your ER care today - we hope that you feel better.  Fall precautions.   Icepack/coldpack to sore area.   Return to ER if worse, new symptoms, persistent vomiting, change in mental status, new or severe pain, other concern.

## 2017-07-15 NOTE — ED Notes (Signed)
Patient ambulatory with two staff assistance without difficulty.

## 2017-07-16 ENCOUNTER — Encounter (HOSPITAL_COMMUNITY): Payer: Self-pay | Admitting: Emergency Medicine

## 2017-07-16 ENCOUNTER — Other Ambulatory Visit: Payer: Self-pay

## 2017-07-16 ENCOUNTER — Emergency Department (HOSPITAL_COMMUNITY): Payer: Medicare Other

## 2017-07-16 ENCOUNTER — Emergency Department (HOSPITAL_COMMUNITY)
Admission: EM | Admit: 2017-07-16 | Discharge: 2017-07-16 | Disposition: A | Discharge status: Home / Self Care | Payer: Medicare Other | Attending: Emergency Medicine | Admitting: Emergency Medicine

## 2017-07-16 DIAGNOSIS — Z79899 Other long term (current) drug therapy: Secondary | ICD-10-CM | POA: Diagnosis not present

## 2017-07-16 DIAGNOSIS — Y939 Activity, unspecified: Secondary | ICD-10-CM | POA: Insufficient documentation

## 2017-07-16 DIAGNOSIS — I10 Essential (primary) hypertension: Secondary | ICD-10-CM | POA: Insufficient documentation

## 2017-07-16 DIAGNOSIS — Z87891 Personal history of nicotine dependence: Secondary | ICD-10-CM | POA: Insufficient documentation

## 2017-07-16 DIAGNOSIS — F0390 Unspecified dementia without behavioral disturbance: Secondary | ICD-10-CM | POA: Insufficient documentation

## 2017-07-16 DIAGNOSIS — S8001XA Contusion of right knee, initial encounter: Secondary | ICD-10-CM | POA: Insufficient documentation

## 2017-07-16 DIAGNOSIS — S8000XA Contusion of unspecified knee, initial encounter: Secondary | ICD-10-CM

## 2017-07-16 DIAGNOSIS — Y999 Unspecified external cause status: Secondary | ICD-10-CM | POA: Diagnosis not present

## 2017-07-16 DIAGNOSIS — Z7982 Long term (current) use of aspirin: Secondary | ICD-10-CM | POA: Insufficient documentation

## 2017-07-16 DIAGNOSIS — S0083XA Contusion of other part of head, initial encounter: Secondary | ICD-10-CM | POA: Insufficient documentation

## 2017-07-16 DIAGNOSIS — W19XXXA Unspecified fall, initial encounter: Secondary | ICD-10-CM | POA: Insufficient documentation

## 2017-07-16 DIAGNOSIS — N390 Urinary tract infection, site not specified: Secondary | ICD-10-CM

## 2017-07-16 DIAGNOSIS — S8002XA Contusion of left knee, initial encounter: Secondary | ICD-10-CM | POA: Insufficient documentation

## 2017-07-16 DIAGNOSIS — Y92129 Unspecified place in nursing home as the place of occurrence of the external cause: Secondary | ICD-10-CM | POA: Insufficient documentation

## 2017-07-16 DIAGNOSIS — S0990XA Unspecified injury of head, initial encounter: Secondary | ICD-10-CM | POA: Diagnosis present

## 2017-07-16 LAB — URINALYSIS, ROUTINE W REFLEX MICROSCOPIC
Bilirubin Urine: NEGATIVE
Glucose, UA: NEGATIVE mg/dL
KETONES UR: 5 mg/dL — AB
Nitrite: NEGATIVE
PROTEIN: 30 mg/dL — AB
Specific Gravity, Urine: 1.013 (ref 1.005–1.030)
WBC, UA: >50 WBC/hpf — ABNORMAL HIGH (ref 0–5)
pH: 5 (ref 5.0–8.0)

## 2017-07-16 LAB — CBC WITH DIFFERENTIAL/PLATELET
BASOS ABS: 0 10*3/uL (ref 0.0–0.1)
Basophils Relative: 0 %
Eosinophils Absolute: 0.1 10*3/uL (ref 0.0–0.7)
Eosinophils Relative: 2 %
HCT: 32 % — ABNORMAL LOW (ref 36.0–46.0)
HEMOGLOBIN: 10.5 g/dL — AB (ref 12.0–15.0)
LYMPHS ABS: 1.1 10*3/uL (ref 0.7–4.0)
LYMPHS PCT: 24 %
MCH: 28.4 pg (ref 26.0–34.0)
MCHC: 32.8 g/dL (ref 30.0–36.0)
MCV: 86.5 fL (ref 78.0–100.0)
Monocytes Absolute: 0.6 10*3/uL (ref 0.1–1.0)
Monocytes Relative: 12 %
NEUTROS PCT: 62 %
Neutro Abs: 2.9 10*3/uL (ref 1.7–7.7)
Platelets: 200 10*3/uL (ref 150–400)
RBC: 3.7 MIL/uL — AB (ref 3.87–5.11)
RDW: 15.8 % — ABNORMAL HIGH (ref 11.5–15.5)
WBC: 4.6 10*3/uL (ref 4.0–10.5)

## 2017-07-16 LAB — COMPREHENSIVE METABOLIC PANEL
ALK PHOS: 54 U/L (ref 38–126)
ALT: 13 U/L (ref 0–44)
AST: 29 U/L (ref 15–41)
Albumin: 3.6 g/dL (ref 3.5–5.0)
Anion gap: 11 (ref 5–15)
BILIRUBIN TOTAL: 0.7 mg/dL (ref 0.3–1.2)
BUN: 25 mg/dL — AB (ref 8–23)
CO2: 24 mmol/L (ref 22–32)
Calcium: 9.5 mg/dL (ref 8.9–10.3)
Chloride: 108 mmol/L (ref 98–111)
Creatinine, Ser: 1.04 mg/dL — ABNORMAL HIGH (ref 0.44–1.00)
GFR calc non Af Amer: 49 mL/min — ABNORMAL LOW (ref >60)
GFR, EST AFRICAN AMERICAN: 57 mL/min — AB (ref >60)
Glucose, Bld: 92 mg/dL (ref 70–99)
Potassium: 3.7 mmol/L (ref 3.5–5.1)
Sodium: 143 mmol/L (ref 135–145)
Total Protein: 6.9 g/dL (ref 6.5–8.1)

## 2017-07-16 MED ORDER — CEPHALEXIN 500 MG PO CAPS: 500.0000 mg | ORAL_CAPSULE | Freq: Three times a day (TID) | ORAL | 0 refills | Status: DC

## 2017-07-16 MED ORDER — CEPHALEXIN 500 MG PO CAPS
500.0000 mg | ORAL_CAPSULE | Freq: Once | ORAL | Status: AC
  Administered 2017-07-16: 500 mg via ORAL
  Filled 2017-07-16: qty 1

## 2017-07-16 NOTE — ED Provider Notes (Signed)
[facility]-EMERGENCY DEPT Provider Note   CSN: [id number] Arrival date & time: 07/16/17  0445  Time seen 04:55 AM    History   Chief Complaint Chief Complaint  Patient presents with  . Fall   Level 5 caveat for dementia  HPI Marissa Chung is a 84 y.o. female.  HPI   patient was seen in the ED on July 14 after a fall at her nursing facility.  They sent her back tonight stating she had a unwitnessed fall.  She is not on any blood thinners.  She does have a hematoma especially of her left forehead.  Also found some bruising on her knees.  Patient denies having any pain except she states sometimes her left shoulder hurts but she is had that for a while.  They reported to EMS that patient is at her baseline.  PCP Dr Cristina Gong   Past Medical History:  Diagnosis Date  . Cataract    BILATERAL  . Frontal lobe dementia   . History of shingles   . Hypertension     Patient Active Problem List   Diagnosis Date Noted  . Palliative care by specialist   . Goals of care, counseling/discussion   . Encephalopathy 05/12/2017  . Acute encephalopathy 08/27/2013  . Confusion 08/27/2013  . Accelerated hypertension 07/26/2013  . Frontotemporal dementia 07/26/2013  . Anemia 07/26/2013  . Dyslipidemia 07/26/2013    Past Surgical History:  Procedure Laterality Date  . FINGER SURGERY Right    pinky finger     OB History   None      Home Medications    Prior to Admission medications   Medication Sig Start Date End Date Taking? Authorizing Provider  acetaminophen (TYLENOL) 650 MG CR tablet Take 650 mg by mouth daily.    [provider]  aspirin EC 81 MG EC tablet Take 1 tablet (81 mg total) by mouth daily. 05/19/17   Narda Bonds, MD  cephALEXin (KEFLEX) 500 MG capsule Take 1 capsule (500 mg total) by mouth 3 (three) times daily. 07/16/17   Devoria Albe, MD  Chloroxylenol-Zinc Oxide [facility] EX) Apply 1 application topically See admin instructions.  Apply to skin 3x/day as needed. Apply to sacral area with each incontinence episode.    [provider]  Feeding Supplies MISC Take 1 Can by mouth 3 (three) times daily.    [provider]  lactulose (CHRONULAC) 10 GM/15ML solution Take 20 g by mouth 2 (two) times daily.    [provider]  levothyroxine (SYNTHROID, LEVOTHROID) 25 MCG tablet Take 25 mcg by mouth daily before breakfast.    [provider]  lisinopril (PRINIVIL,ZESTRIL) 10 MG tablet Take 10 mg by mouth daily.    [provider]  megestrol (MEGACE) 400 MG/10ML suspension Take 10 mLs (400 mg total) by mouth daily. Patient not taking: Reported on 07/15/2017 05/19/17   Narda Bonds, MD  OLANZapine zydis (ZYPREXA) 5 MG disintegrating tablet Take 5 mg by mouth at bedtime.    [provider]  Polyethyl Glycol-Propyl Glycol (SYSTANE) 0.4-0.3 % SOLN Place 1 drop into both eyes 3 (three) times daily.    [provider]  risperiDONE (RISPERDAL) 0.5 MG tablet Take 1 tablet (0.5 mg total) by mouth at bedtime. Patient not taking: Reported on 07/15/2017 05/18/17   Narda Bonds, MD  rivastigmine (EXELON) 9.5 mg/24hr Place 9.5 mg onto the skin daily.    [provider]    Family History History reviewed.  No pertinent family history.  Social History Social History   Tobacco Use  . Smoking status: Former Games developer  . Tobacco comment: quit smoking over 20 years ago   Substance Use Topics  . Alcohol use: No  . Drug use: No  pt is in a nursing facility   Allergies   Shellfish-derived products   Review of Systems Review of Systems  Unable to perform ROS: Dementia     Physical Exam Updated Vital Signs BP (!) 151/79 (BP Location: Left Arm)   Pulse 80   Temp (!) 97.5 F (36.4 C) (Oral)   Resp 14   Ht 5\' 7"  (1.702 m)   Wt 60.3 kg (133 lb)   SpO2 100%   BMI 20.83 kg/m   Vital signs normal except hypertension   Physical Exam  Constitutional: She is  oriented to person, place, and time.  Non-toxic appearance. She does not appear ill. No distress.  Thin elderly female who when she talks to me looks away from me.   HENT:  Head: Normocephalic.  Right Ear: External ear normal.  Left Ear: External ear normal.  Nose: Nose normal. No mucosal edema or rhinorrhea.  Mouth/Throat: Oropharynx is clear and moist and mucous membranes are normal. No dental abscesses or uvula swelling.  Patient appears to have swelling on both sides of her forehead, worse on the left with a superficial abrasion of her left lateral forehead.  There is no active bleeding.  Eyes: Pupils are equal, round, and reactive to light. Conjunctivae and EOM are normal.  Neck: Normal range of motion and full passive range of motion without pain. Neck supple.  She denies any pain in her neck to palpation  Cardiovascular: Normal rate, regular rhythm and normal heart sounds. Exam reveals no gallop and no friction rub.  No murmur heard. Pulmonary/Chest: Effort normal and breath sounds normal. No respiratory distress. She has no wheezes. She has no rhonchi. She has no rales. She exhibits no tenderness and no crepitus.  Abdominal: Soft. Normal appearance and bowel sounds are normal. She exhibits no distension. There is no tenderness. There is no rebound and no guarding.  Musculoskeletal: Normal range of motion. She exhibits no edema or tenderness.  Moves all extremities well.  She does not have any pain with range of motion.  She does have some bruising to both of her knees.  There is no joint effusions present.  Neurological: She is alert and oriented to person, place, and time. She has normal strength. No cranial nerve deficit.  Skin: Skin is warm, dry and intact. No rash noted. No erythema. No pallor.  Psychiatric: She has a normal mood and affect. Her speech is normal and behavior is normal. Her mood appears not anxious.  Nursing note and vitals reviewed.     Right knee    Left  Knee     ED Treatments / Results  Labs (all labs ordered are listed, but only abnormal results are displayed) Results for orders placed or performed during the hospital encounter of 07/16/17  Comprehensive metabolic panel  Result Value Ref Range   Sodium 143 135 - 145 mmol/L   Potassium 3.7 3.5 - 5.1 mmol/L   Chloride 108 98 - 111 mmol/L   CO2 24 22 - 32 mmol/L   Glucose, Bld 92 70 - 99 mg/dL   BUN 25 (H) 8 - 23 mg/dL   Creatinine, Ser 1.61 (H) 0.44 - 1.00 mg/dL   Calcium 9.5 8.9 - 09.6 mg/dL  Total Protein 6.9 6.5 - 8.1 g/dL   Albumin 3.6 3.5 - 5.0 g/dL   AST 29 15 - 41 U/L   ALT 13 0 - 44 U/L   Alkaline Phosphatase 54 38 - 126 U/L   Total Bilirubin 0.7 0.3 - 1.2 mg/dL   GFR calc non Af Amer 49 (L) >60 mL/min   GFR calc Af Amer 57 (L) >60 mL/min   Anion gap 11 5 - 15  CBC with Differential  Result Value Ref Range   WBC 4.6 4.0 - 10.5 K/uL   RBC 3.70 (L) 3.87 - 5.11 MIL/uL   Hemoglobin 10.5 (L) 12.0 - 15.0 g/dL   HCT 30.8 (L) 65.7 - 84.6 %   MCV 86.5 78.0 - 100.0 fL   MCH 28.4 26.0 - 34.0 pg   MCHC 32.8 30.0 - 36.0 g/dL   RDW 96.2 (H) 95.2 - 84.1 %   Platelets 200 150 - 400 K/uL   Neutrophils Relative % 62 %   Neutro Abs 2.9 1.7 - 7.7 K/uL   Lymphocytes Relative 24 %   Lymphs Abs 1.1 0.7 - 4.0 K/uL   Monocytes Relative 12 %   Monocytes Absolute 0.6 0.1 - 1.0 K/uL   Eosinophils Relative 2 %   Eosinophils Absolute 0.1 0.0 - 0.7 K/uL   Basophils Relative 0 %   Basophils Absolute 0.0 0.0 - 0.1 K/uL  Urinalysis, Routine w reflex microscopic  Result Value Ref Range   Color, Urine YELLOW YELLOW   APPearance HAZY (A) CLEAR   Specific Gravity, Urine 1.013 1.005 - 1.030   pH 5.0 5.0 - 8.0   Glucose, UA NEGATIVE NEGATIVE mg/dL   Hgb urine dipstick SMALL (A) NEGATIVE   Bilirubin Urine NEGATIVE NEGATIVE   Ketones, ur 5 (A) NEGATIVE mg/dL   Protein, ur 30 (A) NEGATIVE mg/dL   Nitrite NEGATIVE NEGATIVE   Leukocytes, UA LARGE (A) NEGATIVE   RBC / HPF 6-10 0 - 5 RBC/hpf     WBC, UA >50 (H) 0 - 5 WBC/hpf   Bacteria, UA RARE (A) NONE SEEN   Mucus PRESENT    Laboratory interpretation all normal except possible UTI, urine culture sent, stable anemia, mild elevation of BUN    EKG None  Radiology Ct Head Wo Contrast Ct Maxillofacial Wo Cm  Result Date: 07/16/2017 CLINICAL DATA:  Fall.  Facial trauma EXAM: CT HEAD WITHOUT CONTRAST CT MAXILLOFACIAL WITHOUT CONTRAST TECHNIQUE: Multidetector CT imaging of the head and maxillofacial structures were performed using the standard protocol without intravenous contrast. Multiplanar CT image reconstructions of the maxillofacial structures were also generated. COMPARISON:  None. FINDINGS: CT HEAD FINDINGS Brain: Mild atrophy and mild chronic microvascular ischemic changes in the white matter. Negative for acute infarct, hemorrhage, mass. No fluid collection or midline shift. Vascular: Negative for hyperdense vessel Skull: Negative Other: Negative CT MAXILLOFACIAL FINDINGS Osseous: Negative for facial fracture. Orbits: Negative for fracture of the orbit. Orbital soft tissues normal. Sinuses: Paranasal sinuses are clear and well aerated. No air-fluid level. Soft tissues: Mild soft tissue swelling left forehead IMPRESSION: 1. No acute intracranial abnormality 2. Negative for facial fracture Electronically Signed   By: Marlan Palau M.D.   On: 07/16/2017 07:16    Procedures Procedures (including critical care time)  Medications Ordered in ED Medications  cephALEXin (KEFLEX) capsule 500 mg (500 mg Oral Given 07/16/17 0630)     Initial Impression / Assessment and Plan / ED Course  I have reviewed the triage vital signs and the nursing  notes.  Pertinent labs & imaging results that were available during my care of the patient were reviewed by me and considered in my medical decision making (see chart for details).    Patient is been to the ED twice in 24 hours.  Laboratory testing was done for this visit and head CT and  facial CT was done.   Final Clinical Impressions(s) / ED Diagnoses   Final diagnoses:  Fall at nursing home, initial encounter  Urinary tract infection without hematuria, site unspecified  Contusion of forehead, initial encounter  Contusion of knee, unspecified laterality, initial encounter    ED Discharge Orders        Ordered    cephALEXin (KEFLEX) 500 MG capsule  3 times daily     07/16/17 0619      Plan discharge  Devoria AlbeIva Annistyn Depass, MD, Concha PyoFACEP    Yvan Dority, MD 07/16/17 [phone]

## 2017-07-16 NOTE — ED Notes (Signed)
[facility] called  

## 2017-07-16 NOTE — Discharge Instructions (Signed)
Give her the antibiotic until gone. Ice packs to the swollen places if she will allow it. Return to the ED for any problems on the head injury sheet.

## 2017-07-16 NOTE — ED Triage Notes (Signed)
Patient from [facility] with unwitnessed fall, no thinners, no LOC, no pain. Did hit head with a small hematoma to forehead. Baseline dementia.

## 2017-07-16 NOTE — ED Notes (Signed)
Bed: WA25 Expected date:  Expected time:  Means of arrival:  Comments: EMS  

## 2017-07-19 LAB — URINE CULTURE: SPECIAL REQUESTS: NORMAL

## 2017-07-20 ENCOUNTER — Telehealth: Payer: Self-pay

## 2017-07-20 NOTE — Telephone Encounter (Signed)
Post ED Visit - Positive Culture Follow-up  Culture report reviewed by antimicrobial stewardship pharmacist:  []  Enzo BiNathan Batchelder, Pharm.D. []  Celedonio MiyamotoJeremy Frens, Pharm.D., BCPS AQ-ID []  Garvin FilaMike Maccia, Pharm.D., BCPS []  Georgina PillionElizabeth Martin, [address] BCPS []  HectorMinh Pham, [address]Ph[address]D., BCPS, AAHIVP []  Estella HuskMichelle Turner, Pharm.D., BCPS, AAHIVP [x]  Lysle Pearlachel Rumbarger, PharmD, BCPS []  Phillips Climeshuy Dang, PharmD, BCPS []  Agapito GamesAlison Masters, PharmD, BCPS []  Verlan FriendsErin Deja, PharmD  Positive urine culture Treated with Cephalexin, organism sensitive to the same and no further patient follow-up is required at this time.  Jerry CarasCullom, Brittany Amirault Burnett 07/20/2017, 10:19 AM

## 2017-07-20 NOTE — Progress Notes (Signed)
ED Antimicrobial Stewardship Positive Culture Follow Up   Marissa Chung is an 84 y.o. female who [facility] with a chief complaint of  Chief Complaint  Patient presents with  . Fall    Recent Results (from the past 720 hour(s))  Urine culture     Status: Abnormal   Collection Time: 07/16/17  5:37 AM  Result Value Ref Range Status   Specimen Description   Final    URINE, CATHETERIZED Performed at [facility], [address] [place], [place] [address]    Special Requests   Final    Normal Performed at [facility], [address] [place], [place] [address]    Culture >=100,000 COLONIES/mL ENTEROBACTER AEROGENES (A)  Final   Report Status 07/19/2017 FINAL  Final   Organism ID, Bacteria ENTEROBACTER AEROGENES (A)  Final      Susceptibility   Enterobacter aerogenes - MIC*    CEFAZOLIN >=64 RESISTANT Resistant     CEFTRIAXONE >=64 RESISTANT Resistant     CIPROFLOXACIN <=0.25 SENSITIVE Sensitive     GENTAMICIN <=1 SENSITIVE Sensitive     IMIPENEM 1 SENSITIVE Sensitive     NITROFURANTOIN 64 INTERMEDIATE Intermediate     TRIMETH/SULFA <=20 SENSITIVE Sensitive     PIP/TAZO >=128 RESISTANT Resistant     * >=100,000 COLONIES/mL ENTEROBACTER AEROGENES    [x]  Treated with cephalexin, organism resistant to prescribed antimicrobial []  Patient discharged originally without antimicrobial agent and treatment is now indicated  New antibiotic prescription: stop cephalexin, start bactrim DS 1 tablet PO BID x 3 days  ED Provider: Leary RocaMichael Maczis, PA   Velina Drollinger, Drake LeachRachel Lynn 07/20/2017, 9:47 AM Clinical Pharmacist Monday - Friday phone -  [phone] Saturday - Sunday phone - [phone]

## 2017-07-31 ENCOUNTER — Emergency Department (HOSPITAL_COMMUNITY): Payer: Medicare Other

## 2017-07-31 ENCOUNTER — Emergency Department (HOSPITAL_COMMUNITY)
Admission: EM | Admit: 2017-07-31 | Discharge: 2017-07-31 | Disposition: A | Discharge status: Home / Self Care | Payer: Medicare Other | Attending: Emergency Medicine | Admitting: Emergency Medicine

## 2017-07-31 ENCOUNTER — Encounter (HOSPITAL_COMMUNITY): Payer: Self-pay | Admitting: Emergency Medicine

## 2017-07-31 ENCOUNTER — Other Ambulatory Visit: Payer: Self-pay

## 2017-07-31 DIAGNOSIS — Z79899 Other long term (current) drug therapy: Secondary | ICD-10-CM | POA: Insufficient documentation

## 2017-07-31 DIAGNOSIS — Y999 Unspecified external cause status: Secondary | ICD-10-CM | POA: Insufficient documentation

## 2017-07-31 DIAGNOSIS — Y939 Activity, unspecified: Secondary | ICD-10-CM | POA: Insufficient documentation

## 2017-07-31 DIAGNOSIS — Y92129 Unspecified place in nursing home as the place of occurrence of the external cause: Secondary | ICD-10-CM | POA: Insufficient documentation

## 2017-07-31 DIAGNOSIS — S0990XA Unspecified injury of head, initial encounter: Secondary | ICD-10-CM | POA: Diagnosis present

## 2017-07-31 DIAGNOSIS — I10 Essential (primary) hypertension: Secondary | ICD-10-CM | POA: Diagnosis not present

## 2017-07-31 DIAGNOSIS — F0390 Unspecified dementia without behavioral disturbance: Secondary | ICD-10-CM | POA: Insufficient documentation

## 2017-07-31 DIAGNOSIS — W19XXXA Unspecified fall, initial encounter: Secondary | ICD-10-CM | POA: Diagnosis not present

## 2017-07-31 DIAGNOSIS — Z87891 Personal history of nicotine dependence: Secondary | ICD-10-CM | POA: Diagnosis not present

## 2017-07-31 LAB — BASIC METABOLIC PANEL
ANION GAP: 8 (ref 5–15)
BUN: 19 mg/dL (ref 8–23)
CO2: 24 mmol/L (ref 22–32)
Calcium: 8.8 mg/dL — ABNORMAL LOW (ref 8.9–10.3)
Chloride: 110 mmol/L (ref 98–111)
Creatinine, Ser: 0.96 mg/dL (ref 0.44–1.00)
GFR calc Af Amer: >60 mL/min (ref >60)
GFR, EST NON AFRICAN AMERICAN: 54 mL/min — AB (ref >60)
GLUCOSE: 101 mg/dL — AB (ref 70–99)
POTASSIUM: 4 mmol/L (ref 3.5–5.1)
Sodium: 142 mmol/L (ref 135–145)

## 2017-07-31 LAB — CBC
HEMATOCRIT: 31.5 % — AB (ref 36.0–46.0)
Hemoglobin: 9.8 g/dL — ABNORMAL LOW (ref 12.0–15.0)
MCH: 27.1 pg (ref 26.0–34.0)
MCHC: 31.1 g/dL (ref 30.0–36.0)
MCV: 87.3 fL (ref 78.0–100.0)
Platelets: 169 10*3/uL (ref 150–400)
RBC: 3.61 MIL/uL — AB (ref 3.87–5.11)
RDW: 15.9 % — ABNORMAL HIGH (ref 11.5–15.5)
WBC: 4.6 10*3/uL (ref 4.0–10.5)

## 2017-07-31 LAB — URINALYSIS, ROUTINE W REFLEX MICROSCOPIC
Bilirubin Urine: NEGATIVE
GLUCOSE, UA: NEGATIVE mg/dL
Hgb urine dipstick: NEGATIVE
Ketones, ur: NEGATIVE mg/dL
LEUKOCYTES UA: NEGATIVE
Nitrite: NEGATIVE
PH: 5 (ref 5.0–8.0)
Protein, ur: NEGATIVE mg/dL
Specific Gravity, Urine: 1.016 (ref 1.005–1.030)

## 2017-07-31 NOTE — ED Triage Notes (Signed)
Per [facility] pt coming [facility] after having 2 unwitnessed falls this am, found on groud. Pt hx of dementia and is combative with EMS. Per staff pt is more altered after 2nd fall today. Patient moving all extremities. CBG 123

## 2017-07-31 NOTE — ED Provider Notes (Signed)
[facility] EMERGENCY DEPARTMENT Provider Note   CSN: [id number] Arrival date & time: 07/31/17  1209     History   Chief Complaint Chief Complaint  Patient presents with  . Fall    HPI Dea Bitting is a 84 y.o. female.  HPI   Marissa Chung is an 84yo female with a history of frontotemporal dementia, frequent falls, hypertension and dyslipidemia who presents to the emergency department from her nursing facility for evaluation of 2 unwitnessed falls which occurred this morning.  Per EMS report, patient was found on the ground outside of her nursing facility earlier today after presumably falling.  They were able to help her to standing and did not notice any signs of trauma and therefore did not call EMS initially.  They then saw her again later in day on the carpeted floor in the facility with another presumably unwitnessed fall.  They state that she may be somewhat more altered than her normal and decided to call EMS.  Level 5 caveat applies given patient's dementia.  She does state "I fell" went into the room, but is unable to elaborate anymore information. Per chart review patient on baby aspirin daily. According to her niece at bedside patient is at her mental baseline.   Past Medical History:  Diagnosis Date  . Cataract    BILATERAL  . Frontal lobe dementia   . History of shingles   . Hypertension     Patient Active Problem List   Diagnosis Date Noted  . Palliative care by specialist   . Goals of care, counseling/discussion   . Encephalopathy 05/12/2017  . Acute encephalopathy 08/27/2013  . Confusion 08/27/2013  . Accelerated hypertension 07/26/2013  . Frontotemporal dementia 07/26/2013  . Anemia 07/26/2013  . Dyslipidemia 07/26/2013    Past Surgical History:  Procedure Laterality Date  . FINGER SURGERY Right    pinky finger     OB History   None      Home Medications    Prior to Admission medications   Medication Sig Start Date  End Date Taking? Authorizing Provider  acetaminophen (TYLENOL) 650 MG CR tablet Take 650 mg by mouth daily.    [provider]  aspirin EC 81 MG EC tablet Take 1 tablet (81 mg total) by mouth daily. 05/19/17   Narda Bonds, MD  cephALEXin (KEFLEX) 500 MG capsule Take 1 capsule (500 mg total) by mouth 3 (three) times daily. 07/16/17   Devoria Albe, MD  Chloroxylenol-Zinc Oxide [facility] EX) Apply 1 application topically See admin instructions. Apply to skin 3x/day as needed. Apply to sacral area with each incontinence episode.    [provider]  Feeding Supplies MISC Take 1 Can by mouth 3 (three) times daily.    [provider]  lactulose (CHRONULAC) 10 GM/15ML solution Take 20 g by mouth 2 (two) times daily.    [provider]  levothyroxine (SYNTHROID, LEVOTHROID) 25 MCG tablet Take 25 mcg by mouth daily before breakfast.    [provider]  lisinopril (PRINIVIL,ZESTRIL) 10 MG tablet Take 10 mg by mouth daily.    [provider]  megestrol (MEGACE) 400 MG/10ML suspension Take 10 mLs (400 mg total) by mouth daily. Patient not taking: Reported on 07/15/2017 05/19/17   Narda Bonds, MD  OLANZapine zydis (ZYPREXA) 5 MG disintegrating tablet Take 5 mg by mouth at bedtime.    [provider]  Polyethyl Glycol-Propyl Glycol (SYSTANE) 0.4-0.3 % SOLN Place 1 drop into both eyes  3 (three) times daily.    [provider]  risperiDONE (RISPERDAL) 0.5 MG tablet Take 1 tablet (0.5 mg total) by mouth at bedtime. Patient not taking: Reported on 07/15/2017 05/18/17   Narda Bonds, MD  rivastigmine (EXELON) 9.5 mg/24hr Place 9.5 mg onto the skin daily.    [provider]    Family History No family history on file.  Social History Social History   Tobacco Use  . Smoking status: Former Games developer  . Smokeless tobacco: Never Used  . Tobacco comment: quit smoking over 20 years ago   Substance Use Topics  . Alcohol use: No  . Drug  use: No     Allergies   Shellfish-derived products   Review of Systems Review of Systems  Unable to perform ROS: Dementia     Physical Exam Updated Vital Signs BP 136/68   Pulse 82   Temp (!) 97.4 F (36.3 C) (Axillary)   Resp 16   SpO2 100%   Physical Exam  Constitutional: She appears well-developed and well-nourished. No distress.  Fragile with small body habitus.  She must be directed to lay down and wants to stand up and get out of the bed.  HENT:  Head: Normocephalic and atraumatic.  No visible trauma on the face or head.  No laceration, bruising over face or head.   Eyes: Pupils are equal, round, and reactive to light. Right eye exhibits no discharge. Left eye exhibits no discharge.  Neck: Normal range of motion.  She reports tenderness with palpation of the cervical spine.  No step-off or deformity appreciated.  Cardiovascular: Normal rate, regular rhythm and intact distal pulses.  Murmur (systolic grade 2/6) heard. Pulmonary/Chest: Effort normal and breath sounds normal. No stridor. No respiratory distress. She has no wheezes. She has no rales.  Abdominal: Soft. There is no tenderness.  Musculoskeletal:  No tenderness over the bilateral arms or legs.   Neurological: She is alert. Coordination normal.  Oriented to person, disoriented to time, place and situation.  Voice is good articulation, no dysarthria.  No facial droop.  Moving all extremities.  Skin: Skin is warm and dry. She is not diaphoretic.  No rashes, bruising, break in skin.   Psychiatric: She has a normal mood and affect. Her behavior is normal.  Nursing note and vitals reviewed.    ED Treatments / Results  Labs (all labs ordered are listed, but only abnormal results are displayed) Labs Reviewed  CBC - Abnormal; Notable for the following components:      Result Value   RBC 3.61 (*)    Hemoglobin 9.8 (*)    HCT 31.5 (*)    RDW 15.9 (*)    All other components within normal limits  BASIC  METABOLIC PANEL - Abnormal; Notable for the following components:   Glucose, Bld 101 (*)    Calcium 8.8 (*)    GFR calc non Af Amer 54 (*)    All other components within normal limits  URINALYSIS, ROUTINE W REFLEX MICROSCOPIC    EKG EKG Interpretation  Date/Time:  Tuesday July 31 2017 12:18:37 EDT Ventricular Rate:  81 PR Interval:    QRS Duration: 98 QT Interval:  441 QTC Calculation: 512 R Axis:   46 Text Interpretation:  Sinus rhythm Prolonged QT interval Baseline wander in lead(s) V2 Interpretation limited secondary to artifact No significant change since last tracing Confirmed by Vanetta Mulders [phone]) on 08/01/2017 12:06:24 PM   Radiology Ct Head Wo Contrast  Result Date:  07/31/2017 CLINICAL DATA:  Unwitnessed fall. EXAM: CT HEAD WITHOUT CONTRAST CT CERVICAL SPINE WITHOUT CONTRAST TECHNIQUE: Multidetector CT imaging of the head and cervical spine was performed following the standard protocol without intravenous contrast. Multiplanar CT image reconstructions of the cervical spine were also generated. COMPARISON:  CT scan of July 16, 2017. FINDINGS: CT HEAD FINDINGS Brain: Mild chronic ischemic white matter disease is noted. No mass effect or midline shift is noted. Ventricular size is within normal limits. There is no evidence of mass lesion, hemorrhage or acute infarction. Vascular: No hyperdense vessel or unexpected calcification. Skull: Normal. Negative for fracture or focal lesion. Sinuses/Orbits: No acute finding. Other: None. CT CERVICAL SPINE FINDINGS Alignment: Normal. Skull base and vertebrae: No acute fracture. No primary bone lesion or focal pathologic process. Soft tissues and spinal canal: No prevertebral fluid or swelling. No visible canal hematoma. Disc levels: Moderate degenerative disc disease is noted at C5-6 and C6-7 with anterior osteophyte formation. Upper chest: Negative. Other: None. IMPRESSION: Mild chronic ischemic white matter disease. No acute intracranial  abnormality seen. Moderate multilevel degenerative disc disease. No acute abnormality seen in the cervical spine. Electronically Signed   By: Lupita Raider, M.D.   On: 07/31/2017 14:24   Ct Cervical Spine Wo Contrast  Result Date: 07/31/2017 CLINICAL DATA:  Unwitnessed fall. EXAM: CT HEAD WITHOUT CONTRAST CT CERVICAL SPINE WITHOUT CONTRAST TECHNIQUE: Multidetector CT imaging of the head and cervical spine was performed following the standard protocol without intravenous contrast. Multiplanar CT image reconstructions of the cervical spine were also generated. COMPARISON:  CT scan of July 16, 2017. FINDINGS: CT HEAD FINDINGS Brain: Mild chronic ischemic white matter disease is noted. No mass effect or midline shift is noted. Ventricular size is within normal limits. There is no evidence of mass lesion, hemorrhage or acute infarction. Vascular: No hyperdense vessel or unexpected calcification. Skull: Normal. Negative for fracture or focal lesion. Sinuses/Orbits: No acute finding. Other: None. CT CERVICAL SPINE FINDINGS Alignment: Normal. Skull base and vertebrae: No acute fracture. No primary bone lesion or focal pathologic process. Soft tissues and spinal canal: No prevertebral fluid or swelling. No visible canal hematoma. Disc levels: Moderate degenerative disc disease is noted at C5-6 and C6-7 with anterior osteophyte formation. Upper chest: Negative. Other: None. IMPRESSION: Mild chronic ischemic white matter disease. No acute intracranial abnormality seen. Moderate multilevel degenerative disc disease. No acute abnormality seen in the cervical spine. Electronically Signed   By: Lupita Raider, M.D.   On: 07/31/2017 14:24    Procedures Procedures (including critical care time)  Medications Ordered in ED Medications - No data to display   Initial Impression / Assessment and Plan / ED Course  I have reviewed the triage vital signs and the nursing notes.  Pertinent labs & imaging results that were  available during my care of the patient were reviewed by me and considered in my medical decision making (see chart for details).    Patient with history of dementia and frequent falls presents to the emergency department for evaluation after 2 falls earlier today.  According to her family member at bedside, she is at her mental baseline.  CT head without acute intracranial abnormality, CT cervical spine negative for acute fracture.  UA without evidence of infection.  CBC reveals normocytic anemia, patient has a history of this but this appears somewhat decreased from baseline (9.8 versus 10.2.)  BMP unremarkable.  Plan to discharge patient back to her nursing care facility with instructions to follow-up with  PCP as scheduled, and I have discussed minor decrease in her hemoglobin with family member.  I discussed strict return precautions and her niece at bedside agrees and appears reliable.  This was a shared visit with Dr. Erma HeritageIsaacs who also saw the patient and agrees with plan to discharge.   Final Clinical Impressions(s) / ED Diagnoses   Final diagnoses:  Fall, initial encounter  Closed head injury, initial encounter    ED Discharge Orders    None       Lawrence MarseillesShrosbree, Wynnie Pacetti J, PA-C 08/01/17 1721    Shaune PollackIsaacs, Cameron, MD 08/03/17 [phone]

## 2017-07-31 NOTE — ED Notes (Signed)
[facility] at bedside for transport.  

## 2017-07-31 NOTE — ED Notes (Signed)
Patient transported to CT 

## 2017-08-04 ENCOUNTER — Emergency Department (HOSPITAL_COMMUNITY)
Admission: EM | Admit: 2017-08-04 | Discharge: 2017-08-04 | Disposition: A | Discharge status: Home / Self Care | Payer: Medicare Other | Attending: Emergency Medicine | Admitting: Emergency Medicine

## 2017-08-04 ENCOUNTER — Other Ambulatory Visit: Payer: Self-pay

## 2017-08-04 ENCOUNTER — Encounter (HOSPITAL_COMMUNITY): Payer: Self-pay

## 2017-08-04 ENCOUNTER — Emergency Department (HOSPITAL_COMMUNITY): Payer: Medicare Other

## 2017-08-04 DIAGNOSIS — N3000 Acute cystitis without hematuria: Secondary | ICD-10-CM

## 2017-08-04 DIAGNOSIS — F0390 Unspecified dementia without behavioral disturbance: Secondary | ICD-10-CM | POA: Diagnosis not present

## 2017-08-04 DIAGNOSIS — R4182 Altered mental status, unspecified: Secondary | ICD-10-CM | POA: Diagnosis present

## 2017-08-04 DIAGNOSIS — I10 Essential (primary) hypertension: Secondary | ICD-10-CM | POA: Insufficient documentation

## 2017-08-04 DIAGNOSIS — Z8659 Personal history of other mental and behavioral disorders: Secondary | ICD-10-CM

## 2017-08-04 DIAGNOSIS — Z87891 Personal history of nicotine dependence: Secondary | ICD-10-CM | POA: Insufficient documentation

## 2017-08-04 DIAGNOSIS — R55 Syncope and collapse: Secondary | ICD-10-CM | POA: Diagnosis not present

## 2017-08-04 DIAGNOSIS — Z79899 Other long term (current) drug therapy: Secondary | ICD-10-CM | POA: Insufficient documentation

## 2017-08-04 DIAGNOSIS — Z7982 Long term (current) use of aspirin: Secondary | ICD-10-CM | POA: Insufficient documentation

## 2017-08-04 DIAGNOSIS — B3741 Candidal cystitis and urethritis: Secondary | ICD-10-CM | POA: Diagnosis not present

## 2017-08-04 DIAGNOSIS — Z862 Personal history of diseases of the blood and blood-forming organs and certain disorders involving the immune mechanism: Secondary | ICD-10-CM | POA: Diagnosis not present

## 2017-08-04 LAB — URINALYSIS, ROUTINE W REFLEX MICROSCOPIC
Bilirubin Urine: NEGATIVE
Glucose, UA: NEGATIVE mg/dL
Ketones, ur: NEGATIVE mg/dL
Nitrite: NEGATIVE
Protein, ur: NEGATIVE mg/dL
SPECIFIC GRAVITY, URINE: 1.01 (ref 1.005–1.030)
pH: 5 (ref 5.0–8.0)

## 2017-08-04 LAB — COMPREHENSIVE METABOLIC PANEL
ALBUMIN: 3.5 g/dL (ref 3.5–5.0)
ALT: 10 U/L (ref 0–44)
ANION GAP: 10 (ref 5–15)
AST: 24 U/L (ref 15–41)
Alkaline Phosphatase: 68 U/L (ref 38–126)
BUN: 19 mg/dL (ref 8–23)
CO2: 23 mmol/L (ref 22–32)
Calcium: 9.1 mg/dL (ref 8.9–10.3)
Chloride: 107 mmol/L (ref 98–111)
Creatinine, Ser: 1.08 mg/dL — ABNORMAL HIGH (ref 0.44–1.00)
GFR calc Af Amer: 54 mL/min — ABNORMAL LOW (ref >60)
GFR calc non Af Amer: 46 mL/min — ABNORMAL LOW (ref >60)
GLUCOSE: 101 mg/dL — AB (ref 70–99)
POTASSIUM: 4.2 mmol/L (ref 3.5–5.1)
SODIUM: 140 mmol/L (ref 135–145)
TOTAL PROTEIN: 6.6 g/dL (ref 6.5–8.1)
Total Bilirubin: 0.7 mg/dL (ref 0.3–1.2)

## 2017-08-04 LAB — DIFFERENTIAL
Abs Immature Granulocytes: 0 10*3/uL (ref 0.0–0.1)
BASOS ABS: 0 10*3/uL (ref 0.0–0.1)
Basophils Relative: 0 %
EOS ABS: 0.2 10*3/uL (ref 0.0–0.7)
EOS PCT: 4 %
IMMATURE GRANULOCYTES: 0 %
Lymphocytes Relative: 29 %
Lymphs Abs: 1.2 10*3/uL (ref 0.7–4.0)
Monocytes Absolute: 0.4 10*3/uL (ref 0.1–1.0)
Monocytes Relative: 11 %
Neutro Abs: 2.3 10*3/uL (ref 1.7–7.7)
Neutrophils Relative %: 56 %

## 2017-08-04 LAB — PROTIME-INR
INR: 1.03
PROTHROMBIN TIME: 13.4 s (ref 11.4–15.2)

## 2017-08-04 LAB — I-STAT TROPONIN, ED: Troponin i, poc: 0.01 ng/mL (ref 0.00–0.08)

## 2017-08-04 LAB — APTT: aPTT: 30 seconds (ref 24–36)

## 2017-08-04 LAB — CBC
HCT: 34.1 % — ABNORMAL LOW (ref 36.0–46.0)
Hemoglobin: 10.5 g/dL — ABNORMAL LOW (ref 12.0–15.0)
MCH: 27.4 pg (ref 26.0–34.0)
MCHC: 30.8 g/dL (ref 30.0–36.0)
MCV: 89 fL (ref 78.0–100.0)
PLATELETS: 172 10*3/uL (ref 150–400)
RBC: 3.83 MIL/uL — AB (ref 3.87–5.11)
RDW: 16.1 % — AB (ref 11.5–15.5)
WBC: 4.1 10*3/uL (ref 4.0–10.5)

## 2017-08-04 LAB — CBG MONITORING, ED: GLUCOSE-CAPILLARY: 99 mg/dL (ref 70–99)

## 2017-08-04 MED ORDER — LEVOFLOXACIN 500 MG PO TABS
500.0000 mg | ORAL_TABLET | Freq: Once | ORAL | Status: AC
  Administered 2017-08-04: 500 mg via ORAL
  Filled 2017-08-04: qty 1

## 2017-08-04 MED ORDER — LEVOFLOXACIN 500 MG PO TABS: 500.0000 mg | ORAL_TABLET | Freq: Every day | ORAL | 0 refills | Status: DC

## 2017-08-04 MED ORDER — FLUCONAZOLE 150 MG PO TABS
150.0000 mg | ORAL_TABLET | Freq: Once | ORAL | Status: AC
  Administered 2017-08-04: 150 mg via ORAL
  Filled 2017-08-04: qty 1

## 2017-08-04 NOTE — ED Provider Notes (Signed)
[facility] EMERGENCY DEPARTMENT Provider Note   CSN: [id number] Arrival date & time: 08/04/17  1500   An emergency department physician performed an initial assessment on this suspected stroke patient at 1502.  History   Chief Complaint Chief Complaint  Patient presents with  . Code Stroke    HPI Marissa Chung is a 84 y.o. female.  HPI Brought from [facility] as a code stroke.  Patient was apparently seen by staff at 12:30 PM.  Reportedly she wanted to be sitting outside in her wheelchair.  They checked on her at 2 PM and found her difficult to awaken.  Reportedly her mental status was not at baseline but it has not been well-defined from any report by SNF as to what the change actually was.  EMS was called.  EMS reports the patient was initially very difficult to arouse and not responding to sternal rub.  She started responding to questions saying "yeah" to everything that I asked.  She became more alert patient became restless and objected to evaluation.  Code stroke was canceled.  Patient had recently been seen 7\30 for several apparent unwitnessed falls without signs of trauma at which time she was reported by SNF staff to be more altered than her baseline. Past Medical History:  Diagnosis Date  . Cataract    BILATERAL  . Frontal lobe dementia   . History of shingles   . Hypertension     Patient Active Problem List   Diagnosis Date Noted  . Palliative care by specialist   . Goals of care, counseling/discussion   . Encephalopathy 05/12/2017  . Acute encephalopathy 08/27/2013  . Confusion 08/27/2013  . Accelerated hypertension 07/26/2013  . Frontotemporal dementia 07/26/2013  . Anemia 07/26/2013  . Dyslipidemia 07/26/2013    Past Surgical History:  Procedure Laterality Date  . FINGER SURGERY Right    pinky finger     OB History   None      Home Medications    Prior to Admission medications   Medication Sig Start Date End Date Taking?  Authorizing Provider  acetaminophen (TYLENOL) 650 MG CR tablet Take 650 mg by mouth daily.   Yes [provider]  aspirin EC 81 MG EC tablet Take 1 tablet (81 mg total) by mouth daily. 05/19/17  Yes Narda Bonds, MD  Chloroxylenol-Zinc Oxide Jeanie Cooks EX) Apply 1 application topically See admin instructions. Apply to skin 3x/day as needed. Apply to sacral area with each incontinence episode.   Yes [provider]  Feeding Supplies MISC Take 1 Can by mouth 3 (three) times daily.   Yes [provider]  lactulose (CHRONULAC) 10 GM/15ML solution Take 20 g by mouth 2 (two) times daily.   Yes [provider]  levothyroxine (SYNTHROID, LEVOTHROID) 25 MCG tablet Take 25 mcg by mouth daily before breakfast.   Yes [provider]  lisinopril (PRINIVIL,ZESTRIL) 10 MG tablet Take 10 mg by mouth daily.   Yes [provider]  nitrofurantoin, macrocrystal-monohydrate, (MACROBID) 100 MG capsule Take 100 mg by mouth at bedtime.   Yes [provider]  OLANZapine zydis (ZYPREXA) 5 MG disintegrating tablet Take 5 mg by mouth at bedtime.   Yes [provider]  Polyethyl Glycol-Propyl Glycol (SYSTANE) 0.4-0.3 % SOLN Place 1 drop into both eyes 3 (three) times daily.   Yes [provider]  rivastigmine (EXELON) 9.5 mg/24hr Place 9.5 mg onto the skin daily.   Yes [provider]  Vitamin D, Ergocalciferol, (DRISDOL)  50000 units CAPS capsule Take 50,000 Units by mouth every 7 (seven) days.   Yes [provider]  levofloxacin (LEVAQUIN) 500 MG tablet Take 1 tablet (500 mg total) by mouth daily. 08/04/17   Arby Barrette, MD  megestrol (MEGACE) 400 MG/10ML suspension Take 10 mLs (400 mg total) by mouth daily. Patient not taking: Reported on 07/15/2017 05/19/17   Narda Bonds, MD  risperiDONE (RISPERDAL) 0.5 MG tablet Take 1 tablet (0.5 mg total) by mouth at bedtime. Patient not taking: Reported on 07/15/2017 05/18/17   Narda Bonds, MD    Family History No family history on file.  Social History Social History   Tobacco Use  . Smoking status: Former Games developer  . Smokeless tobacco: Never Used  . Tobacco comment: quit smoking over 20 years ago   Substance Use Topics  . Alcohol use: No  . Drug use: No     Allergies   Shellfish-derived products   Review of Systems Review of Systems Level 5 caveat cannot obtain review of systems due to dementia.  Physical Exam Updated Vital Signs BP (!) 102/58   Pulse 63   Temp 97.8 F (36.6 C) (Oral)   Resp 14   Ht 5\' 7"  (1.702 m)   Wt 60.3 kg (133 lb)   SpO2 97%   BMI 20.83 kg/m   Physical Exam  Constitutional:  Patient arrives alert.  She exhibits signs of dementia or confusion.  She will interact but many responses are not well oriented to the situation.  She is trying to sit up and get out of the bed.  HENT:  No signs of trauma.  No facial injury or head injury.  no intraoral injury.  Airway is patent without any secretions or sonorous respirations.  Eyes: EOM are normal.  Neck: Neck supple.  Cardiovascular: Normal rate and regular rhythm.  Pulmonary/Chest: Effort normal and breath sounds normal. She exhibits no tenderness.  Abdominal: Soft. She exhibits no distension. There is no tenderness.  Musculoskeletal: Normal range of motion.  Patient is moving all extremities with symmetric fashion to either pull blankets around her or push things away.  She is also rolling onto her side and moving both legs.  Other times she is sitting up in the stretcher and using her arms to push herself up in her legs to scoot down.  She is not really following any commands for full neuro testing.  Skin: Skin is warm and dry.  Psychiatric:  Mildly agitated upon arrival.     ED Treatments / Results  Labs (all labs ordered are listed, but only abnormal results are displayed) Labs Reviewed  CBC - Abnormal; Notable for the following components:      Result Value   RBC 3.83  (*)    Hemoglobin 10.5 (*)    HCT 34.1 (*)    RDW 16.1 (*)    All other components within normal limits  COMPREHENSIVE METABOLIC PANEL - Abnormal; Notable for the following components:   Glucose, Bld 101 (*)    Creatinine, Ser 1.08 (*)    GFR calc non Af Amer 46 (*)    GFR calc Af Amer 54 (*)    All other components within normal limits  URINALYSIS, ROUTINE W REFLEX MICROSCOPIC - Abnormal; Notable for the following components:   Hgb urine dipstick SMALL (*)    Leukocytes, UA MODERATE (*)    Bacteria, UA RARE (*)    All other components within normal limits  URINE CULTURE  PROTIME-INR  APTT  DIFFERENTIAL  I-STAT TROPONIN, ED  CBG MONITORING, ED    EKG EKG Interpretation  Date/Time:  Saturday August 04 2017 15:20:26 EDT Ventricular Rate:  71 PR Interval:    QRS Duration: 100 QT Interval:  392 QTC Calculation: 426 R Axis:   57 Text Interpretation:  Sinus rhythm Low voltage, extremity leads no ischemic changes. normal. no change from previous Confirmed by Arby BarrettePfeiffer, Richardson Dubree [phone]([id number]) on 08/04/2017 7:30:35 PM   Radiology Ct Head Wo Contrast  Result Date: 08/04/2017 CLINICAL DATA:  Pt is a canceled code stroke. Had multiple seizures today EXAM: CT HEAD WITHOUT CONTRAST TECHNIQUE: Contiguous axial images were obtained from the base of the skull through the vertex without intravenous contrast. COMPARISON:  CT on 07/31/2017 FINDINGS: Brain: No evidence of acute infarction, hemorrhage, hydrocephalus, extra-axial collection or mass lesion/mass effect. Vascular: There is atherosclerotic calcifications of the carotic siphons. Skull: Normal. Negative for fracture or focal lesion. Sinuses/Orbits: No acute finding. Other: Study quality is degraded by patient motion artifact.Multiple levels are repeated. IMPRESSION: No evidence for acute intracranial abnormality. Electronically Signed   By: Norva PavlovElizabeth  Brown M.D.   On: 08/04/2017 16:07    Procedures Procedures (including critical care  time)  Medications Ordered in ED Medications  levofloxacin (LEVAQUIN) tablet 500 mg (has no administration in time range)  fluconazole (DIFLUCAN) tablet 150 mg (has no administration in time range)     Initial Impression / Assessment and Plan / ED Course  I have reviewed the triage vital signs and the nursing notes.  Pertinent labs & imaging results that were available during my care of the patient were reviewed by me and considered in my medical decision making (see chart for details).      Patient rechecked after CT scans and diagnostic evaluation.  Her mental status had improved.  She still is not well oriented to place and time.  She speaks about what she chooses.  She explains to me that she sleeps most of the day and that she is very comfortable sleeping here.  Denies any pain.  She will follow commands adequately to perform grip strength bilateral upper extremities.  She will also move both lower extremities at command.  Per the patient's nurse, her niece had been here.  She was not present when I was in the room.  She apparently had to leave and was not returning.  Her nurse reports that patient's niece spoke with her and reported that she is at her baseline. Final Clinical Impressions(s) / ED Diagnoses   Final diagnoses:  Mental status change resolved  Acute cystitis without hematuria  Dementia without behavioral disturbance, unspecified dementia type  Yeast cystitis   Patient presents as outlined above.  Review of EMR suggest this is similar to previous presentation.  At that time, reportedly the patient presented first agitated and trying to get out of the bed which was the case today.  She slept for a long period of time and then subsequently was more interactive, calm and following commands.  She is still confused and speaks of things which are not relevant to the situation.  Review of EMR suggestive the patient has dementia and during her previous hospitalization she  exhibited confusion much of the time.  This appears to be her baseline.  I question whether or not the patient might have seizures.  Reportedly she is very difficult to arouse or falls as in the prior visit, arrives somewhat agitated and mildly combative then subsequently becomes more commonly  interactive but exhibiting dementia.  Patient did have urinary tract infection on 7/17 and was discharged with Keflex.  Today urine test slightly positive.  There is also yeast present.  Culture from 7\17 shows sensitivity to flora quinolones.  Given the patient still has trace positive urine will opt to treat with Levaquin and a dose of Diflucan is given the emergency department for yeast.  Instructions are to recheck within a week. ED Discharge Orders        Ordered    levofloxacin (LEVAQUIN) 500 MG tablet  Daily     08/04/17 2108       Arby Barrette, MD 08/04/17 2235

## 2017-08-04 NOTE — Consult Note (Signed)
NEURO HOSPITALIST      Requesting Physician: Dr. Johnney Killian    Chief Complaint: AMS  History obtained from:  Chart  HPI:                                                                                                                                         Marissa Chung is an 84 y.o. female with a PMH significant for frontal lobe dementia and TIA 3 years ago, who presents to [facility] from [facility] assisted living for evaluation of AMS after a syncopal episode. Initially called as a code stroke, which was canceled after no focal/lateralizing deficits were noted on exam.   Per EMS, patient was last seen well at 12:30pm when staff let the patient sit outside. When they brought her back in about an hour later they noticed some AMS. She was staring at the ground and would not follow any commands. EMS was called; EMS initiated a code stroke. Upon chart review patient has presented to [facility] with similar presentations as a code stroke. Last seen 05-10-17 at [facility] for such.  ED course:  BP: 150/100, BG: 99. Stroke team met patient on the bridge. Report was received from EMS. IV access attempted. Neurology evaluated and code stroke was canceled prior to CT.  No previous stroke history noted. Just a TIA 3 years ago   Date last known well: Date: 08/04/2017 Time last known well: Time: 12:30 tPA Given: No: not a stroke, syncope/ dementia Modified Rankin: Rankin Score=3    Past Medical History:  Diagnosis Date  . Cataract    BILATERAL  . Frontal lobe dementia   . History of shingles   . Hypertension     Past Surgical History:  Procedure Laterality Date  . FINGER SURGERY Right    pinky finger    No family history on file.       Social History:  reports that she has quit smoking. She has never used smokeless tobacco. She reports that she does not drink alcohol or use drugs.  Allergies:  Allergies  Allergen Reactions  . Shellfish-Derived  Products Other (See Comments)    Unknown documented from [facility] Marissa Chung    Medications:  No current facility-administered medications for this encounter.    Current Outpatient Medications  Medication Sig Dispense Refill  . acetaminophen (TYLENOL) 650 MG CR tablet Take 650 mg by mouth daily.    Marland Kitchen aspirin EC 81 MG EC tablet Take 1 tablet (81 mg total) by mouth daily. 30 tablet 0  . Chloroxylenol-Zinc Oxide (BAZA EX) Apply 1 application topically See admin instructions. Apply to skin 3x/day as needed. Apply to sacral area with each incontinence episode.    . Feeding Supplies MISC Take 1 Can by mouth 3 (three) times daily.    Marland Kitchen lactulose (CHRONULAC) 10 GM/15ML solution Take 20 g by mouth 2 (two) times daily.    Marland Kitchen levothyroxine (SYNTHROID, LEVOTHROID) 25 MCG tablet Take 25 mcg by mouth daily before breakfast.    . lisinopril (PRINIVIL,ZESTRIL) 10 MG tablet Take 10 mg by mouth daily.    . megestrol (MEGACE) 400 MG/10ML suspension Take 10 mLs (400 mg total) by mouth daily. (Patient not taking: Reported on 07/15/2017) 240 mL 0  . nitrofurantoin, macrocrystal-monohydrate, (MACROBID) 100 MG capsule Take 100 mg by mouth at bedtime.    Marland Kitchen OLANZapine zydis (ZYPREXA) 5 MG disintegrating tablet Take 5 mg by mouth at bedtime.    Vladimir Faster Glycol-Propyl Glycol (SYSTANE) 0.4-0.3 % SOLN Place 1 drop into both eyes 3 (three) times daily.    . risperiDONE (RISPERDAL) 0.5 MG tablet Take 1 tablet (0.5 mg total) by mouth at bedtime. (Patient not taking: Reported on 07/15/2017) 30 tablet 0  . rivastigmine (EXELON) 9.5 mg/24hr Place 9.5 mg onto the skin daily.    . Vitamin D, Ergocalciferol, (DRISDOL) 50000 units CAPS capsule Take 50,000 Units by mouth every 7 (seven) days.      ROS:                                                                                                                                        History obtained from unobtainable from patient due to mental status  General Examination:                                                                                                      There were no vitals taken for this visit.  HEENT-  Normocephalic, no lesions, without obvious abnormality.  Normal external eye and conjunctiva. Bilateral eye edema. Cardiovascular-  pulses palpable throughout Lungs- no excessive working breathing.  Saturations within normal limits on RA Extremities- Warm, dry and intact Musculoskeletal-no joint tenderness, deformity or swelling  Skin-warm and dry, intact.  Neurological Examination Mental Status: Alert, awake, not oriented. Able to state her name.  No dysarthria noted. Poor attention. Non-adherent to commands.  Slightly combative.  Cranial Nerves: Patient blinks to threat. Able to track examiners. No nystagmus. PERRL. Grimace is symmetric. Responds to painful stimuli.   Motor: Withdraws all four extremities to noxious without any asymmetry. Deep Tendon Reflexes: Hypoactive reflexes throughtout Plantars: Right: upgoing  Left: upgoing Cerebellar: UTA Gait: UTA  Lab Results: Basic Metabolic Panel:  CBC:  Lipid Panel: No results for input(s): CHOL, TRIG, HDL, CHOLHDL, VLDL, LDLCALC in the last 168 hours.  CBG: No results for input(s): GLUCAP in the last 168 hours.  Imaging: No results found.     [name], MSN, NP-C [facility] [phone] 08/04/2017, 3:14 PM    Assessment: 84 y.o. female PMH significant for frontal lobe dementia, TIA 3 years ago presents to [facility] from [facility] for AMS after syncopal episode. Initially called as a code stroke, but the code stroke was canceled due to no focal/lateralizing findings.  1. Confused on exam. Speech pattern most consistent with confusion due to diffuse cerebral dysfunction rather than an expressive or receptive dysphasia due to focal brain lesion.   2. Syncope  Recommendations: - BMP - U/A - TSH - B12, folate, RPR, ammonia, LFTs, ESR - IV fluids - Outpatient neurology follow up for dementia  Electronically signed: Dr. Kerney Elbe

## 2017-08-04 NOTE — ED Triage Notes (Signed)
[facility] for code stroke, LSN 1230 today. Pt was outside in wheelchair, came back in and had a syncopal episode around 2pm. Pt would not respond to sternal rub initially, when EMS arrived pt was alert and would say "yeah" to any questions asked. Pt arrives to ED alert, restless and combative. Unable to follow commands. Code stroke cancelled at 1507 by Dr.Lindzen prior to going to CT.   BP 150/100 HR89 CBG 99

## 2017-08-04 NOTE — ED Notes (Signed)
Family at bedside, updated on plan.

## 2017-08-04 NOTE — ED Notes (Signed)
Pt returned from CT, nad 

## 2017-08-04 NOTE — Discharge Instructions (Addendum)
Patient was evaluated for mental status change.  CT scan does not show any acute findings.  Lab work is within normal limits.  Urinalysis is mildly positive.  Patient was treated with Keflex approximately 2 weeks ago for positive urinalysis.  That culture showed sensitivity to flouroquinolones.  Patient will be prescribed Levaquin.  She should have repeat culture after completion of treatment to make sure this is cleared.  Her urine also showed yeast present.  She is given 1 dose of Diflucan in the emergency department.  Urine should be reevaluated for yeast after completion of antibiotic therapy. Family member saw the patient at bedside and reported she was back to her baseline mental status.

## 2017-08-06 LAB — URINE CULTURE: Culture: >=100000 — AB

## 2017-08-07 ENCOUNTER — Telehealth: Payer: Self-pay | Admitting: *Deleted

## 2017-08-07 NOTE — Telephone Encounter (Signed)
Post ED Visit - Positive Culture Follow-up: Successful Patient Follow-Up  Culture assessed and recommendations reviewed by:  []  Enzo BiNathan Batchelder, Pharm.D. []  Celedonio MiyamotoJeremy Frens, Pharm.D., BCPS AQ-ID []  Garvin FilaMike Maccia, Pharm.D., BCPS []  Georgina PillionElizabeth Martin, Pharm.D., BCPS []  DarringtonMinh Pham, [address]Pharm.D., BCPS, AAHIVP []  Estella HuskMichelle Turner, Pharm.D., BCPS, AAHIVP []  Lysle Pearlachel Rumbarger, PharmD, BCPS []  Phillips Climeshuy Dang, PharmD, BCPS []  Agapito GamesAlison Masters, PharmD, BCPS []  Verlan FriendsErin Deja, PharmD  Positive urine culture  []  Patient discharged without antimicrobial prescription and treatment is now indicated []  Organism is resistant to prescribed ED discharge antimicrobial []  Patient with positive blood cultures  Changes discussed with ED provider Claude MangesJohana Soto, PharmD Discontinue Levaquin given potential for AMS [phone] and faxed order to Ethelle LyonKim Sawyer, RN  [phone]   Lysle Pearlobertson, Teran Daughenbaugh Talley 08/07/2017, 10:43 AM

## 2017-08-07 NOTE — Progress Notes (Signed)
ED Antimicrobial Stewardship Positive Culture Follow Up   Marissa Chung is an 84 y.o. female who [facility] with a chief complaint of confusion and altered mental status.  Chief Complaint  Patient presents with  . Code Stroke    Recent Results (from the past 720 hour(s))  Urine culture     Status: Abnormal   Collection Time: 07/16/17  5:37 AM  Result Value Ref Range Status   Specimen Description   Final    URINE, CATHETERIZED Performed at [facility], [address] [place], [place] [address]    Special Requests   Final    Normal Performed at [facility], [address] [place], [place] [address]    Culture >=100,000 COLONIES/mL ENTEROBACTER AEROGENES (A)  Final   Report Status 07/19/2017 FINAL  Final   Organism ID, Bacteria ENTEROBACTER AEROGENES (A)  Final      Susceptibility   Enterobacter aerogenes - MIC*    CEFAZOLIN >=64 RESISTANT Resistant     CEFTRIAXONE >=64 RESISTANT Resistant     CIPROFLOXACIN <=0.25 SENSITIVE Sensitive     GENTAMICIN <=1 SENSITIVE Sensitive     IMIPENEM 1 SENSITIVE Sensitive     NITROFURANTOIN 64 INTERMEDIATE Intermediate     TRIMETH/SULFA <=20 SENSITIVE Sensitive     PIP/TAZO >=128 RESISTANT Resistant     * >=100,000 COLONIES/mL ENTEROBACTER AEROGENES  Urine culture     Status: Abnormal   Collection Time: 08/04/17  5:38 PM  Result Value Ref Range Status   Specimen Description URINE, RANDOM  Final   Special Requests   Final    NONE Performed at [facility], [address] [place], [place] [address]    Culture >=100,000 COLONIES/mL YEAST (A)  Final   Report Status 08/06/2017 FINAL  Final    Patient discharged originally with antimicrobial agent and no treatment is now indicated. Will advise patient to discontinue levofloxacin given risk of adverse effects (including psychiatric disturbances) with antibiotics in this class outweighing potential benefits of antibiotic  use beyond the three days of treatment she has already received.  ED Provider: Claude MangesJohana Soto, PA   Doroteo Glassmanmy C Dennard Vezina 08/07/2017, 9:21 AM Pharmacy Resident Monday - Friday phone -  [phone] Saturday - Sunday phone - [phone]

## 2017-09-13 ENCOUNTER — Observation Stay (HOSPITAL_COMMUNITY)
Admission: EM | Admit: 2017-09-13 | Discharge: 2017-09-14 | Disposition: A | Discharge status: Nursing Home (Includes ICF) | Payer: Medicare Other | Attending: Internal Medicine | Admitting: Internal Medicine

## 2017-09-13 ENCOUNTER — Emergency Department (HOSPITAL_COMMUNITY): Payer: Medicare Other

## 2017-09-13 ENCOUNTER — Encounter (HOSPITAL_COMMUNITY): Payer: Self-pay

## 2017-09-13 ENCOUNTER — Other Ambulatory Visit: Payer: Self-pay

## 2017-09-13 DIAGNOSIS — E559 Vitamin D deficiency, unspecified: Secondary | ICD-10-CM | POA: Diagnosis not present

## 2017-09-13 DIAGNOSIS — Z79899 Other long term (current) drug therapy: Secondary | ICD-10-CM | POA: Insufficient documentation

## 2017-09-13 DIAGNOSIS — R1310 Dysphagia, unspecified: Secondary | ICD-10-CM | POA: Diagnosis not present

## 2017-09-13 DIAGNOSIS — F0390 Unspecified dementia without behavioral disturbance: Secondary | ICD-10-CM | POA: Diagnosis not present

## 2017-09-13 DIAGNOSIS — Z87440 Personal history of urinary (tract) infections: Secondary | ICD-10-CM | POA: Diagnosis not present

## 2017-09-13 DIAGNOSIS — Z7982 Long term (current) use of aspirin: Secondary | ICD-10-CM | POA: Diagnosis not present

## 2017-09-13 DIAGNOSIS — R410 Disorientation, unspecified: Secondary | ICD-10-CM | POA: Diagnosis not present

## 2017-09-13 DIAGNOSIS — I071 Rheumatic tricuspid insufficiency: Secondary | ICD-10-CM | POA: Diagnosis not present

## 2017-09-13 DIAGNOSIS — E039 Hypothyroidism, unspecified: Secondary | ICD-10-CM | POA: Diagnosis not present

## 2017-09-13 DIAGNOSIS — Z87891 Personal history of nicotine dependence: Secondary | ICD-10-CM | POA: Diagnosis not present

## 2017-09-13 DIAGNOSIS — E860 Dehydration: Secondary | ICD-10-CM

## 2017-09-13 DIAGNOSIS — M6281 Muscle weakness (generalized): Secondary | ICD-10-CM | POA: Diagnosis not present

## 2017-09-13 DIAGNOSIS — I119 Hypertensive heart disease without heart failure: Secondary | ICD-10-CM | POA: Diagnosis not present

## 2017-09-13 DIAGNOSIS — R55 Syncope and collapse: Secondary | ICD-10-CM

## 2017-09-13 DIAGNOSIS — G3109 Other frontotemporal dementia: Secondary | ICD-10-CM | POA: Insufficient documentation

## 2017-09-13 DIAGNOSIS — R9389 Abnormal findings on diagnostic imaging of other specified body structures: Secondary | ICD-10-CM

## 2017-09-13 DIAGNOSIS — F0280 Dementia in other diseases classified elsewhere without behavioral disturbance: Secondary | ICD-10-CM

## 2017-09-13 DIAGNOSIS — I313 Pericardial effusion (noninflammatory): Secondary | ICD-10-CM | POA: Diagnosis not present

## 2017-09-13 DIAGNOSIS — Z91013 Allergy to seafood: Secondary | ICD-10-CM | POA: Diagnosis not present

## 2017-09-13 DIAGNOSIS — D649 Anemia, unspecified: Secondary | ICD-10-CM | POA: Diagnosis not present

## 2017-09-13 DIAGNOSIS — R2681 Unsteadiness on feet: Secondary | ICD-10-CM | POA: Diagnosis not present

## 2017-09-13 DIAGNOSIS — R2990 Unspecified symptoms and signs involving the nervous system: Secondary | ICD-10-CM | POA: Diagnosis not present

## 2017-09-13 DIAGNOSIS — G9340 Encephalopathy, unspecified: Secondary | ICD-10-CM | POA: Diagnosis present

## 2017-09-13 DIAGNOSIS — E785 Hyperlipidemia, unspecified: Secondary | ICD-10-CM | POA: Diagnosis not present

## 2017-09-13 DIAGNOSIS — R4182 Altered mental status, unspecified: Secondary | ICD-10-CM | POA: Diagnosis present

## 2017-09-13 LAB — COMPREHENSIVE METABOLIC PANEL
ALT: 12 U/L (ref 0–44)
AST: 24 U/L (ref 15–41)
Albumin: 3.5 g/dL (ref 3.5–5.0)
Alkaline Phosphatase: 68 U/L (ref 38–126)
Anion gap: 8 (ref 5–15)
BUN: 14 mg/dL (ref 8–23)
CHLORIDE: 110 mmol/L (ref 98–111)
CO2: 25 mmol/L (ref 22–32)
CREATININE: 1.25 mg/dL — AB (ref 0.44–1.00)
Calcium: 9 mg/dL (ref 8.9–10.3)
GFR calc non Af Amer: 39 mL/min — ABNORMAL LOW (ref >60)
GFR, EST AFRICAN AMERICAN: 45 mL/min — AB (ref >60)
Glucose, Bld: 139 mg/dL — ABNORMAL HIGH (ref 70–99)
POTASSIUM: 3.7 mmol/L (ref 3.5–5.1)
SODIUM: 143 mmol/L (ref 135–145)
Total Bilirubin: 0.6 mg/dL (ref 0.3–1.2)
Total Protein: 6.8 g/dL (ref 6.5–8.1)

## 2017-09-13 LAB — I-STAT TROPONIN, ED: TROPONIN I, POC: 0.02 ng/mL (ref 0.00–0.08)

## 2017-09-13 LAB — PROTIME-INR
INR: 1.08
Prothrombin Time: 13.9 seconds (ref 11.4–15.2)

## 2017-09-13 LAB — CBC WITH DIFFERENTIAL/PLATELET
BASOS PCT: 0 %
Basophils Absolute: 0 10*3/uL (ref 0.0–0.1)
EOS ABS: 0.1 10*3/uL (ref 0.0–0.7)
Eosinophils Relative: 1 %
HCT: 32.9 % — ABNORMAL LOW (ref 36.0–46.0)
Hemoglobin: 10.7 g/dL — ABNORMAL LOW (ref 12.0–15.0)
Lymphocytes Relative: 15 %
Lymphs Abs: 1.1 10*3/uL (ref 0.7–4.0)
MCH: 28.1 pg (ref 26.0–34.0)
MCHC: 32.5 g/dL (ref 30.0–36.0)
MCV: 86.4 fL (ref 78.0–100.0)
MONO ABS: 0.4 10*3/uL (ref 0.1–1.0)
Monocytes Relative: 6 %
NEUTROS ABS: 5.5 10*3/uL (ref 1.7–7.7)
Neutrophils Relative %: 78 %
PLATELETS: UNDETERMINED 10*3/uL (ref 150–400)
RBC: 3.81 MIL/uL — AB (ref 3.87–5.11)
RDW: 14.4 % (ref 11.5–15.5)
WBC: 7.1 10*3/uL (ref 4.0–10.5)

## 2017-09-13 LAB — URINALYSIS, ROUTINE W REFLEX MICROSCOPIC
BILIRUBIN URINE: NEGATIVE
GLUCOSE, UA: NEGATIVE mg/dL
HGB URINE DIPSTICK: NEGATIVE
KETONES UR: NEGATIVE mg/dL
Leukocytes, UA: NEGATIVE
NITRITE: NEGATIVE
PH: 6 (ref 5.0–8.0)
Protein, ur: NEGATIVE mg/dL
Specific Gravity, Urine: 1.015 (ref 1.005–1.030)

## 2017-09-13 LAB — APTT: APTT: 28 s (ref 24–36)

## 2017-09-13 LAB — I-STAT CHEM 8, ED
BUN: 15 mg/dL (ref 8–23)
CALCIUM ION: 1.17 mmol/L (ref 1.15–1.40)
CHLORIDE: 106 mmol/L (ref 98–111)
Creatinine, Ser: 1.3 mg/dL — ABNORMAL HIGH (ref 0.44–1.00)
GLUCOSE: 137 mg/dL — AB (ref 70–99)
HCT: 34 % — ABNORMAL LOW (ref 36.0–46.0)
Hemoglobin: 11.6 g/dL — ABNORMAL LOW (ref 12.0–15.0)
Potassium: 3.7 mmol/L (ref 3.5–5.1)
Sodium: 143 mmol/L (ref 135–145)
TCO2: 24 mmol/L (ref 22–32)

## 2017-09-13 LAB — CBG MONITORING, ED: Glucose-Capillary: 131 mg/dL — ABNORMAL HIGH (ref 70–99)

## 2017-09-13 LAB — AMMONIA: Ammonia: 40 umol/L — ABNORMAL HIGH (ref 9–35)

## 2017-09-13 LAB — TSH: TSH: 2.215 u[IU]/mL (ref 0.350–4.500)

## 2017-09-13 MED ORDER — LACTULOSE ENEMA
300.0000 mL | Freq: Once | ORAL | Status: DC
  Filled 2017-09-13: qty 300

## 2017-09-13 MED ORDER — SODIUM CHLORIDE 0.9 % IV SOLN
INTRAVENOUS | Status: AC
  Administered 2017-09-13: 23:00:00 via INTRAVENOUS

## 2017-09-13 MED ORDER — ACETAMINOPHEN 325 MG PO TABS: 650.0000 mg | ORAL_TABLET | Freq: Four times a day (QID) | ORAL | Status: DC | PRN

## 2017-09-13 MED ORDER — ENOXAPARIN SODIUM 30 MG/0.3ML ~~LOC~~ SOLN: 30.0000 mg | SUBCUTANEOUS | Status: DC

## 2017-09-13 MED ORDER — LACTULOSE 10 GM/15ML PO SOLN
10.0000 g | Freq: Once | ORAL | Status: DC
  Filled 2017-09-13: qty 15

## 2017-09-13 MED ORDER — ACETAMINOPHEN 650 MG RE SUPP: 650.0000 mg | Freq: Four times a day (QID) | RECTAL | Status: DC | PRN

## 2017-09-13 MED ORDER — SODIUM CHLORIDE 0.9 % IV BOLUS
500.0000 mL | Freq: Once | INTRAVENOUS | Status: AC
  Administered 2017-09-13: 500 mL via INTRAVENOUS

## 2017-09-13 MED ORDER — HALOPERIDOL LACTATE 5 MG/ML IJ SOLN
2.0000 mg | Freq: Once | INTRAMUSCULAR | Status: AC
  Administered 2017-09-14: 2 mg via INTRAVENOUS
  Filled 2017-09-13: qty 1

## 2017-09-13 NOTE — Consult Note (Addendum)
NEURO HOSPITALIST  CONSULT     Requesting Physician: Dr. Rubin PayorPickering    Chief Complaint: AMS/slurred speech   History obtained from:  chart  HPI:                                                                                                                                         Marissa Chung is an 84 y.o. female  with a PMH significant for HTN, frontal lobe dementia and TIA 3 years ago, who [facility] for evaluation of AMS after a syncopal episode. Initially called as a code stroke, which was canceled after no focal/lateralizing deficits were noted on exam.  Per EMS, patient was last seen well at 1515 when staff let the patient sit outside. When they brought her back in about 30 minutes later they noticed some AMS and slurred speech. She passed out and would not follow any commands. EMS was called; EMS initiated a code stroke. Upon chart review patient [facility] with similar presentations as a code stroke. [facility]04-[facility]  With same presentation. Patient not on any anticoagulation.  ED course:  CT head: negative for any acute abnormality BP 184/95, BG: 108 No prior stroke history. TIA 3 years ago.    Date last known well: Date: 2020-08-1617 Time last known well: Time: 15:13 tPA Given: No: not a stroke Modified Rankin: Rankin Score=4 NIHSS:14    Past Medical History:  Diagnosis Date  . Cataract    BILATERAL  . Frontal lobe dementia   . History of shingles   . Hypertension     Past Surgical History:  Procedure Laterality Date  . FINGER SURGERY Right    pinky finger    No family history on file.      Social History:  reports that she has quit smoking. She has never used smokeless tobacco. She reports that she does not drink alcohol or use drugs.  Allergies:  Allergies  Allergen Reactions  . Shellfish-Derived Products Other (See Comments)    [facility]    Medications:  No current facility-administered medications for this encounter.    Current Outpatient Medications  Medication Sig Dispense Refill  . acetaminophen (TYLENOL) 650 MG CR tablet Take 650 mg by mouth daily.    Marland Kitchen aspirin EC 81 MG EC tablet Take 1 tablet (81 mg total) by mouth daily. 30 tablet 0  . Chloroxylenol-Zinc Oxide (BAZA EX) Apply 1 application topically See admin instructions. Apply to skin 3x/day as needed. Apply to sacral area with each incontinence episode.    . Feeding Supplies MISC Take 1 Can by mouth 3 (three) times daily.    Marland Kitchen lactulose (CHRONULAC) 10 GM/15ML solution Take 20 g by mouth 2 (two) times daily.    Marland Kitchen levofloxacin (LEVAQUIN) 500 MG tablet Take 1 tablet (500 mg total) by mouth daily. 7 tablet 0  . levothyroxine (SYNTHROID, LEVOTHROID) 25 MCG tablet Take 25 mcg by mouth daily before breakfast.    . lisinopril (PRINIVIL,ZESTRIL) 10 MG tablet Take 10 mg by mouth daily.    . megestrol (MEGACE) 400 MG/10ML suspension Take 10 mLs (400 mg total) by mouth daily. (Patient not taking: Reported on 07/15/2017) 240 mL 0  . nitrofurantoin, macrocrystal-monohydrate, (MACROBID) 100 MG capsule Take 100 mg by mouth at bedtime.    Marland Kitchen OLANZapine zydis (ZYPREXA) 5 MG disintegrating tablet Take 5 mg by mouth at bedtime.    Bertram Gala Glycol-Propyl Glycol (SYSTANE) 0.4-0.3 % SOLN Place 1 drop into both eyes 3 (three) times daily.    . risperiDONE (RISPERDAL) 0.5 MG tablet Take 1 tablet (0.5 mg total) by mouth at bedtime. (Patient not taking: Reported on 07/15/2017) 30 tablet 0  . rivastigmine (EXELON) 9.5 mg/24hr Place 9.5 mg onto the skin daily.    . Vitamin D, Ergocalciferol, (DRISDOL) 50000 units CAPS capsule Take 50,000 Units by mouth every 7 (seven) days.       ROS:                                                                                                                                         unobtainable from patient due to mental status  General Examination:                                                                                                      There were no vitals taken for this visit.  HEENT-  Normocephalic, no lesions, without obvious abnormality.  Normal external eye and conjunctiva.  Cardiovascular- S1-S2 audible, pulses palpable throughout   Lungs-no rhonchi or wheezing  noted, no excessive working breathing.  Saturations within normal limits on RA Abdomen- All 4 quadrants palpated and nontender Extremities- Warm, dry and intact Musculoskeletal-no joint tenderness, deformity or swelling Skin-warm and dry, no hyperpigmentation, vitiligo, or suspicious lesions  Neurological Examination Mental Status: Alert, awake, not oriented. Able to state her name.  No dysarthria noted. Poor attention. Non-adherent to commands.    Cranial Nerves: Patient blinks to threat. Able to track examiners. No nystagmus. PERRL. Grimace is symmetric. Responds to painful stimuli.   Motor: Withdraws all four extremities to noxious without any asymmetry. Deep Tendon Reflexes: Hypoactive reflexes throughtout Plantars: Right: upgoing                    Left: upgoing Cerebellar: UTA Gait: UTA   Lab Results:  CBG: Recent Labs  Lab 09/13/17 1600  GLUCAP 131*    Imaging: Ct Head Code Stroke Wo Contrast  Result Date: 10/04/2017 CLINICAL DATA:  Code stroke. Slurred speech focal neuro deficit, less than 6 hours, stroke suspected. EXAM: CT HEAD WITHOUT CONTRAST TECHNIQUE: Contiguous axial images were obtained from the base of the skull through the vertex without intravenous contrast. COMPARISON:  CT head without contrast 08/04/2017 FINDINGS: Brain: Moderate white matter changes are present bilaterally. There is some atrophy. No acute infarct, hemorrhage, or mass lesion is present. Basal ganglia are stable. Insular ribbon is intact  bilaterally. No acute or focal cortical abnormality is present. Vascular: Atherosclerotic calcifications are present in the cavernous internal carotid arteries bilaterally. There is no asymmetric hyperdense vessel. Skull: Calvarium is intact. No focal lytic or blastic lesions are present. Sinuses/Orbits: The paranasal sinuses and mastoid air cells are clear. Globes and orbits are within normal limits. ASPECTS [facility] Stroke Program Early CT Score) - Ganglionic level infarction (caudate, lentiform nuclei, internal capsule, insula, M1-M3 cortex): 7/7 - Supraganglionic infarction (M4-M6 cortex): 3/3 Total score (0-10 with 10 being normal): 10/10 IMPRESSION: 1. No acute intracranial abnormality or significant interval change. 2. Stable atrophy and moderate white matter disease. This likely reflects the sequela of chronic microvascular ischemia. 3. ASPECTS is 10/10 The above was relayed via text pager to Dr. Wilford Corner on 10/04/2017 at 16:13 . Electronically Signed   By: Marin Roberts M.D.   On: 010/03/2017 16:13      Valentina Lucks, MSN, NP-C [facility] [phone]   10/04/2017, 4:09 PM   Attending physician note to follow with Assessment and plan .  Attending addendum Patient seen and examined as an acute code stroke.  Next line code stroke was canceled later on after evaluation due to nonlocalizing symptoms and similar presentations in the past with a negative work-up  Assessment: 84 y.o. female with a PMH significant for HTN, frontal lobe dementia and TIA 3 years ago, who presents to [facility] from [facility] assisted living for evaluation of AMS after a syncopal episode. Initially called as a code stroke, which was canceled after no focal/lateralizing deficits were noted on exam. She has had similar presentation in the past. Symptoms are likely due to her progressing dementia and hypoactive delirium.  Impression: Stroke Risk Factors - hypertension     Recommendations: --U/A -metabolic work up per ER --Chest x-ray --Repeat exams were ER.  If back to baseline, can be discharged to the facility -[facility] outpatient neurology follow-up/memory care follow-up.  NEUROLOGY will sign off. D/W DR. PICKERING  -- Milon Dikes, MD [facility] Pager: [phone] If 7pm to 7am, please call on call as listed on AMION.

## 2017-09-13 NOTE — ED Triage Notes (Signed)
Pt BIB [facility] for eval of AMS and ?strokelike sx. Pt reports EMS [facility] and was seated outside for "no more than 30  Minutes" per staff. Staff went to check on pt and found her to be more confused than normal and acutely confused. Pt has hx of dementia but staff states she is normally much more "articulate". Speech is garbled, and nonsensical. Pt follows commands, but is unable to answer questions at this tie. Pt w/ muttering and repetitive speech. No unilateral weakness or sensory changes on exam.

## 2017-09-13 NOTE — ED Provider Notes (Signed)
[facility] Provider Note   CSN: [id number] Arrival date & time: 09/13/17  1555     History   Chief Complaint Chief Complaint  Patient presents with  . Code Stroke    HPI Marissa Patrickrcelle Engelstad is a 84 y.o. female. Level 5 caveat due to dementia and altered mental status. HPI Patient presents with mental status change.  Came in as a code stroke by EMS.  Reportedly last normal at 315.  Had been sitting outside and then nonverbal.  Reportedly at baseline will speak and walk occasionally.  Apparently has been talked about her next new haircut.  CBG was 108.  Blood pressure was okay.  Has history of urinary tract infections. Past Medical History:  Diagnosis Date  . Cataract    BILATERAL  . Frontal lobe dementia   . History of shingles   . Hypertension     Patient Active Problem List   Diagnosis Date Noted  . Palliative care by specialist   . Goals of care, counseling/discussion   . Encephalopathy 05/12/2017  . Acute encephalopathy 08/27/2013  . Confusion 08/27/2013  . Accelerated hypertension 07/26/2013  . Frontotemporal dementia 07/26/2013  . Anemia 07/26/2013  . Dyslipidemia 07/26/2013    Past Surgical History:  Procedure Laterality Date  . FINGER SURGERY Right    pinky finger     OB History   None      Home Medications    Prior to Admission medications   Medication Sig Start Date End Date Taking? Authorizing Provider  acetaminophen (TYLENOL) 650 MG CR tablet Take 650 mg by mouth daily.   Yes [provider]  aspirin EC 81 MG EC tablet Take 1 tablet (81 mg total) by mouth daily. 05/19/17  Yes Narda BondsNettey, Ralph A, MD  Chloroxylenol-Zinc Oxide Jeanie Cooks(BAZA EX) Apply 1 application topically See admin instructions. Apply to skin 3x/day as needed. Apply to sacral area with each incontinence episode.   Yes [provider]  Feeding Supplies MISC Take 1 Can by mouth 3 (three) times daily.   Yes [provider]    lactulose (CHRONULAC) 10 GM/15ML solution Take 20 g by mouth 2 (two) times daily.   Yes [provider]  levothyroxine (SYNTHROID, LEVOTHROID) 25 MCG tablet Take 25 mcg by mouth daily before breakfast.   Yes [provider]  lisinopril (PRINIVIL,ZESTRIL) 10 MG tablet Take 10 mg by mouth daily.   Yes [provider]  nitrofurantoin, macrocrystal-monohydrate, (MACROBID) 100 MG capsule Take 100 mg by mouth at bedtime.   Yes [provider]  OLANZapine zydis (ZYPREXA) 5 MG disintegrating tablet Take 5 mg by mouth at bedtime.   Yes [provider]  Polyethyl Glycol-Propyl Glycol (SYSTANE) 0.4-0.3 % SOLN Place 1 drop into both eyes 3 (three) times daily.   Yes [provider]  rivastigmine (EXELON) 9.5 mg/24hr Place 9.5 mg onto the skin daily.   Yes [provider]  Vitamin D, Ergocalciferol, (DRISDOL) 50000 units CAPS capsule Take 50,000 Units by mouth every 7 (seven) days.   Yes [provider]  megestrol (MEGACE) 400 MG/10ML suspension Take 10 mLs (400 mg total) by mouth daily. Patient not taking: Reported on 07/15/2017 05/19/17   Narda BondsNettey, Ralph A, MD  risperiDONE (RISPERDAL) 0.5 MG tablet Take 1 tablet (0.5 mg total) by mouth at bedtime. Patient not taking: Reported on 07/15/2017 05/18/17   Narda BondsNettey, Ralph A, MD    Family History History reviewed. No pertinent family history.  Social History Social History  Tobacco Use  . Smoking status: Former Games developer  . Smokeless tobacco: Never Used  . Tobacco comment: quit smoking over 20 years ago   Substance Use Topics  . Alcohol use: No  . Drug use: No     Allergies   Shellfish-derived products   Review of Systems Review of Systems  Unable to perform ROS: Mental status change     Physical Exam Updated Vital Signs BP 139/69   Pulse 79   Temp (S) 99.6 F (37.6 C) (Rectal)   Resp 16   Wt 60.5 kg   SpO2 100%   BMI 20.89 kg/m   Physical Exam  Constitutional: She  appears well-developed.  HENT:  Head: Normocephalic.  Neck: Neck supple.  Cardiovascular: Normal rate.  Pulmonary/Chest: Effort normal.  Abdominal: There is no tenderness.  Musculoskeletal: She exhibits no edema.  Upper extremity somewhat flexed.  Neurological:  Sitting on the stretcher with eyes closed.  Will say a few words will not answer questions or follow commands.  Skin: Skin is warm. Capillary refill takes less than 2 seconds.     ED Treatments / Results  Labs (all labs ordered are listed, but only abnormal results are displayed) Labs Reviewed  CBC WITH DIFFERENTIAL/PLATELET - Abnormal; Notable for the following components:      Result Value   RBC 3.81 (*)    Hemoglobin 10.7 (*)    HCT 32.9 (*)    All other components within normal limits  COMPREHENSIVE METABOLIC PANEL - Abnormal; Notable for the following components:   Glucose, Bld 139 (*)    Creatinine, Ser 1.25 (*)    GFR calc non Af Amer 39 (*)    GFR calc Af Amer 45 (*)    All other components within normal limits  CBG MONITORING, ED - Abnormal; Notable for the following components:   Glucose-Capillary 131 (*)    All other components within normal limits  I-STAT CHEM 8, ED - Abnormal; Notable for the following components:   Creatinine, Ser 1.30 (*)    Glucose, Bld 137 (*)    Hemoglobin 11.6 (*)    HCT 34.0 (*)    All other components within normal limits  PROTIME-INR  APTT  URINALYSIS, ROUTINE W REFLEX MICROSCOPIC  TSH  AMMONIA  I-STAT TROPONIN, ED    EKG EKG Interpretation  Date/Time:  Thursday September 13 2017 16:16:22 EDT Ventricular Rate:  76 PR Interval:    QRS Duration: 92 QT Interval:  379 QTC Calculation: 427 R Axis:   77 Text Interpretation:  Sinus rhythm Borderline T abnormalities, anterior leads Baseline wander in lead(s) V6 Confirmed by Benjiman Core [phone]) on 09/13/2017 4:31:32 PM   Radiology Dg Chest 2 View  Result Date: 09/13/2017 CLINICAL DATA:  Patient with altered  mental status. EXAM: CHEST - 2 VIEW COMPARISON:  None. FINDINGS: Monitoring leads overlie the patient. Normal scratch the cardiac contours upper limits of normal. Multiple monitoring leads overlie the patient. No large area pulmonary consolidation. Dense nodular opacity projecting over the left hemithorax superiorly may represent overlapping costal cartilage. No pleural effusion or pneumothorax. Thoracic spine degenerative changes. IMPRESSION: Mild cardiomegaly.  No acute cardiopulmonary process. Nodular opacity projecting over the left upper hemithorax may represent overlapping rib/costal cartilage. Recommend further evaluation with repeat chest radiograph when patient is clinically able. Electronically Signed   By: Annia Belt M.D.   On: 09/13/2017 17:41   Ct Head Code Stroke Wo Contrast  Result Date: 09/13/2017 CLINICAL DATA:  Code stroke. Slurred speech focal  neuro deficit, less than 6 hours, stroke suspected. EXAM: CT HEAD WITHOUT CONTRAST TECHNIQUE: Contiguous axial images were obtained from the base of the skull through the vertex without intravenous contrast. COMPARISON:  CT head without contrast 08/04/2017 FINDINGS: Brain: Moderate white matter changes are present bilaterally. There is some atrophy. No acute infarct, hemorrhage, or mass lesion is present. Basal ganglia are stable. Insular ribbon is intact bilaterally. No acute or focal cortical abnormality is present. Vascular: Atherosclerotic calcifications are present in the cavernous internal carotid arteries bilaterally. There is no asymmetric hyperdense vessel. Skull: Calvarium is intact. No focal lytic or blastic lesions are present. Sinuses/Orbits: The paranasal sinuses and mastoid air cells are clear. Globes and orbits are within normal limits. ASPECTS [facility] Stroke Program Early CT Score) - Ganglionic level infarction (caudate, lentiform nuclei, internal capsule, insula, M1-M3 cortex): 7/7 - Supraganglionic infarction (M4-M6 cortex): 3/3  Total score (0-10 with 10 being normal): 10/10 IMPRESSION: 1. No acute intracranial abnormality or significant interval change. 2. Stable atrophy and moderate white matter disease. This likely reflects the sequela of chronic microvascular ischemia. 3. ASPECTS is 10/10 The above was relayed via text pager to Dr. Wilford Corner on 12-23-202019 at 16:13 . Electronically Signed   By: Marin Roberts M.D.   On: 012-23-202019 16:13    Procedures Procedures (including critical care time)  Medications Ordered in ED Medications  sodium chloride 0.9 % bolus 500 mL (0 mLs Intravenous Stopped 09/13/17 1750)     Initial Impression / Assessment and Plan / ED Course  I have reviewed the triage vital signs and the nursing notes.  Pertinent labs & imaging results that were available during my care of the patient were reviewed by me and considered in my medical decision making (see chart for details).     Patient with encephalopathy.  Had been outside for around 30 minutes.  Likely due to dehydration.  Creatinine somewhat increased.  Seen by neurology as a code stroke called prehospital for difficulty speaking.  Baseline is somewhat labile but is usually better than today.  Discussed with patient's daughter extensively.  Neurology is seen the patient and recommends no further neurologic work-up.  I think patient benefit from IV hydration since she is still somewhat altered and not taking orals.  Although some of the mental status is likely her dementia.  Goals of care is a discussion to have with patient and her niece.  Will admit to unassigned.  Final Clinical Impressions(s) / ED Diagnoses   Final diagnoses:  Dehydration  Encephalopathy    ED Discharge Orders    None       Benjiman Core, MD 09/13/17 [phone]

## 2017-09-13 NOTE — H&P (Addendum)
TRH H&P   Patient Demographics:    Marissa Chung, is a 84 y.o. female  MRN: [id number]   DOB - 26-Apr-1933  Admit Date - 2020/08/717  Outpatient Primary MD for the patient is System, Pcp Not In  Referring MD/NP/PA:   Benjiman Core  Outpatient Specialists:    Patient coming from:  [facility]  Chief Complaint  Patient presents with  . Code Stroke      HPI:    Marissa Chung  is a 84 y.o. female, w dementia, recurrent uti, hypertension apparently was outside in the heat and began to have some slurred speech and confusion and then according to neurology notes syncope.  Pt was brought to ED for code stroke.   In Ed,  Neurology evaluated the patient and didn't think that this was a code stroke.  They suggested looking for metabolic causes or ruling out uti.   INR 1.08 PTT 28 Urinalysis negative Wbc 7.1, hgb 10.7, Plt clotted  Na 143, K 3.7,  Bun 14, Creatinine 1.25 Ast 24, Alt 12 Trop 0.02 Ammonia 40  CXR IMPRESSION: Mild cardiomegaly.  No acute cardiopulmonary process.  Nodular opacity projecting over the left upper hemithorax may represent overlapping rib/costal cartilage. Recommend further evaluation with repeat chest radiograph when patient is clinically able.  CT brain IMPRESSION: 1. No acute intracranial abnormality or significant interval change. 2. Stable atrophy and moderate white matter disease. This likely reflects the sequela of chronic microvascular ischemia. 3. ASPECTS is 10/10  Pt will be admitted for slurred speech, altered mental status and syncope.      Review of systems:    In addition to the HPI above,  No Fever-chills, No Headache, No changes with Vision or hearing, No problems swallowing food or Liquids, No Chest pain, Cough or Shortness of Breath, No Abdominal pain, No Nausea or Vommitting, Bowel movements are regular, No  Blood in stool or Urine, No dysuria, No new skin rashes or bruises, No new joints pains-aches,  No new weakness, tingling, numbness in any extremity, No recent weight gain or loss, No polyuria, polydypsia or polyphagia, No significant Mental Stressors.  A full 10 point Review of Systems was done, except as stated above, all other Review of Systems were negative.   With Past History of the following :    Past Medical History:  Diagnosis Date  . Cataract    BILATERAL  . Frontal lobe dementia   . History of shingles   . Hypertension       Past Surgical History:  Procedure Laterality Date  . FINGER SURGERY Right    pinky finger      Social History:     Social History   Tobacco Use  . Smoking status: Former Games developer  . Smokeless tobacco: Never Used  . Tobacco comment: quit smoking over 20 years ago   Substance Use Topics  . Alcohol  use: No     Lives - at [facility]  Mobility - unclear   Family History :     Family History  Family history unknown: Yes   Unable to obtain due to dementia   Home Medications:   Prior to Admission medications   Medication Sig Start Date End Date Taking? Authorizing Provider  acetaminophen (TYLENOL) 650 MG CR tablet Take 650 mg by mouth daily.   Yes [provider]  aspirin EC 81 MG EC tablet Take 1 tablet (81 mg total) by mouth daily. 05/19/17  Yes Narda Bonds, MD  Chloroxylenol-Zinc Oxide Jeanie Cooks EX) Apply 1 application topically See admin instructions. Apply to skin 3x/day as needed. Apply to sacral area with each incontinence episode.   Yes [provider]  Feeding Supplies MISC Take 1 Can by mouth 3 (three) times daily.   Yes [provider]  lactulose (CHRONULAC) 10 GM/15ML solution Take 20 g by mouth 2 (two) times daily.   Yes [provider]  levothyroxine (SYNTHROID, LEVOTHROID) 25 MCG tablet Take 25 mcg by mouth daily before breakfast.   Yes [provider]  lisinopril  (PRINIVIL,ZESTRIL) 10 MG tablet Take 10 mg by mouth daily.   Yes [provider]  nitrofurantoin, macrocrystal-monohydrate, (MACROBID) 100 MG capsule Take 100 mg by mouth at bedtime.   Yes [provider]  OLANZapine zydis (ZYPREXA) 5 MG disintegrating tablet Take 5 mg by mouth at bedtime.   Yes [provider]  Polyethyl Glycol-Propyl Glycol (SYSTANE) 0.4-0.3 % SOLN Place 1 drop into both eyes 3 (three) times daily.   Yes [provider]  rivastigmine (EXELON) 9.5 mg/24hr Place 9.5 mg onto the skin daily.   Yes [provider]  Vitamin D, Ergocalciferol, (DRISDOL) 50000 units CAPS capsule Take 50,000 Units by mouth every 7 (seven) days.   Yes [provider]  megestrol (MEGACE) 400 MG/10ML suspension Take 10 mLs (400 mg total) by mouth daily. Patient not taking: Reported on 07/15/2017 05/19/17   Narda Bonds, MD  risperiDONE (RISPERDAL) 0.5 MG tablet Take 1 tablet (0.5 mg total) by mouth at bedtime. Patient not taking: Reported on 07/15/2017 05/18/17   Narda Bonds, MD     Allergies:     Allergies  Allergen Reactions  . Shellfish-Derived Products Other (See Comments)    Unknown documented from [facility]     Physical Exam:   Vitals  Blood pressure 128/73, pulse 74, temperature (S) 99.6 F (37.6 C), temperature source Rectal, resp. rate 17, weight 60.5 kg, SpO2 100 %.   1. General  lying in bed in NAD,    2.  Somnolent , Oriented X 1, responds to name  3. No F.N deficits, ALL C.Nerves Intact, Strength 5/5 all 4 extremities, Sensation intact all 4 extremities, pt moves all 4 extremities to voice,   Plantars down going.  4. Ears and Eyes appear Normal, Conjunctivae clear, PERRLA. Moist Oral Mucosa.  5. Supple Neck, No JVD, No cervical lymphadenopathy appriciated, No Carotid Bruits.  6. Symmetrical Chest wall movement, Good air movement bilaterally, CTAB.  7. RRR, No Gallops, Rubs or Murmurs, No Parasternal Heave.  8. Positive  Bowel Sounds, Abdomen Soft, No tenderness, No organomegaly appriciated,No rebound -guarding or rigidity.  9.  No Cyanosis, Normal Skin Turgor, No Skin Rash or Bruise.  10. Good muscle tone,  joints appear normal , no effusions, Normal ROM.  11. No Palpable Lymph Nodes in Neck or Axillae     Data Review:  CBC Recent Labs  Lab 09/13/17 1618 09/13/17 1625  WBC 7.1  --   HGB 10.7* 11.6*  HCT 32.9* 34.0*  PLT PLATELET CLUMPS NOTED ON SMEAR, UNABLE TO ESTIMATE  --   MCV 86.4  --   MCH 28.1  --   MCHC 32.5  --   RDW 14.4  --   LYMPHSABS 1.1  --   MONOABS 0.4  --   EOSABS 0.1  --   BASOSABS 0.0  --    ------------------------------------------------------------------------------------------------------------------  Chemistries  Recent Labs  Lab 09/13/17 1618 09/13/17 1625  NA 143 143  K 3.7 3.7  CL 110 106  CO2 25  --   GLUCOSE 139* 137*  BUN 14 15  CREATININE 1.25* 1.30*  CALCIUM 9.0  --   AST 24  --   ALT 12  --   ALKPHOS 68  --   BILITOT 0.6  --    ------------------------------------------------------------------------------------------------------------------ estimated creatinine clearance is 31.9 mL/min (A) (by C-G formula based on SCr of 1.3 mg/dL (H)). ------------------------------------------------------------------------------------------------------------------ Recent Labs    09/13/17 1806  TSH 2.215    Coagulation profile Recent Labs  Lab 09/13/17 1602  INR 1.08   ------------------------------------------------------------------------------------------------------------------- No results for input(s): DDIMER in the last 72 hours. -------------------------------------------------------------------------------------------------------------------  Cardiac Enzymes No results for input(s): CKMB, TROPONINI, MYOGLOBIN in the last 168 hours.  Invalid input(s):  CK ------------------------------------------------------------------------------------------------------------------ No results found for: BNP   ---------------------------------------------------------------------------------------------------------------  Urinalysis    Component Value Date/Time   COLORURINE YELLOW 09/13/2017 1615   APPEARANCEUR CLEAR 09/13/2017 1615   LABSPEC 1.015 09/13/2017 1615   PHURINE 6.0 09/13/2017 1615   GLUCOSEU NEGATIVE 09/13/2017 1615   HGBUR NEGATIVE 09/13/2017 1615   BILIRUBINUR NEGATIVE 09/13/2017 1615   KETONESUR NEGATIVE 09/13/2017 1615   PROTEINUR NEGATIVE 09/13/2017 1615   UROBILINOGEN 1.0 08/27/2013 1112   NITRITE NEGATIVE 09/13/2017 1615   LEUKOCYTESUR NEGATIVE 09/13/2017 1615    ----------------------------------------------------------------------------------------------------------------   Imaging Results:    Dg Chest 2 View  Result Date: 09/13/2017 CLINICAL DATA:  Patient with altered mental status. EXAM: CHEST - 2 VIEW COMPARISON:  None. FINDINGS: Monitoring leads overlie the patient. Normal scratch the cardiac contours upper limits of normal. Multiple monitoring leads overlie the patient. No large area pulmonary consolidation. Dense nodular opacity projecting over the left hemithorax superiorly may represent overlapping costal cartilage. No pleural effusion or pneumothorax. Thoracic spine degenerative changes. IMPRESSION: Mild cardiomegaly.  No acute cardiopulmonary process. Nodular opacity projecting over the left upper hemithorax may represent overlapping rib/costal cartilage. Recommend further evaluation with repeat chest radiograph when patient is clinically able. Electronically Signed   By: Annia Belt M.D.   On: 09/13/2017 17:41   Ct Head Code Stroke Wo Contrast  Result Date: 09/13/2017 CLINICAL DATA:  Code stroke. Slurred speech focal neuro deficit, less than 6 hours, stroke suspected. EXAM: CT HEAD WITHOUT CONTRAST TECHNIQUE:  Contiguous axial images were obtained from the base of the skull through the vertex without intravenous contrast. COMPARISON:  CT head without contrast 08/04/2017 FINDINGS: Brain: Moderate white matter changes are present bilaterally. There is some atrophy. No acute infarct, hemorrhage, or mass lesion is present. Basal ganglia are stable. Insular ribbon is intact bilaterally. No acute or focal cortical abnormality is present. Vascular: Atherosclerotic calcifications are present in the cavernous internal carotid arteries bilaterally. There is no asymmetric hyperdense vessel. Skull: Calvarium is intact. No focal lytic or blastic lesions are present. Sinuses/Orbits: The paranasal sinuses and mastoid air cells are clear. Globes and orbits are within  normal limits. ASPECTS [facility] Stroke Program Early CT Score) - Ganglionic level infarction (caudate, lentiform nuclei, internal capsule, insula, M1-M3 cortex): 7/7 - Supraganglionic infarction (M4-M6 cortex): 3/3 Total score (0-10 with 10 being normal): 10/10 IMPRESSION: 1. No acute intracranial abnormality or significant interval change. 2. Stable atrophy and moderate white matter disease. This likely reflects the sequela of chronic microvascular ischemia. 3. ASPECTS is 10/10 The above was relayed via text pager to Dr. Wilford CornerArora on 09/13/2017 at 16:13 . Electronically Signed   By: Marin Robertshristopher  Mattern M.D.   On: 09/13/2017 16:13       Assessment & Plan:    Principal Problem:   Altered mental status Active Problems:   Frontotemporal dementia   Syncope    AMS Syncope Tele Trop I q6h x3 Check MRI brain Check cardiac echo Pt had CTA neck 05/13/2017 negative for significant stenosis  Dementia Cont Exelon Cont zyprexa  Hypothyroidism Cont levothyroxine  Recurrent UTI Cont macrobid  Vitamin D def Cont Vitamin D  Abnormal CXR, h/o pulmonary nodule RUL Repeat CXR in am   DVT Prophylaxis  Lovenox - SCDs   AM Labs Ordered, also please review Full  Orders  Family Communication: Admission, patients condition and plan of care including tests being ordered have been discussed with the patient  who indicate understanding and agree with the plan and Code Status.  Code Status  FULL CODE  [facility]  Condition GUARDED   Consults called:   Neurology by ED  Admission status: observation,  Pt has altered mental status and syncope, neurology and ED recommend hospitalization , will admit observation  and follow neurology recommendations.   If not improving clinically may need inpatient status  Time spent in minutes : 2570   Pearson GrippeJames Khadar Monger M.D on 09/13/2017 at 8:24 PM  Between 7am to 7pm - Pager - [phone]  . After 7pm go to [email or web address] - password [email or web address]  [facility] - Office  [phone]

## 2017-09-13 NOTE — Code Documentation (Signed)
84[facility]. Patient from facility where she was sitting outside when staff noted she was less responsive and confused. Of note, patient with similar presentations in the past. EMS called and activated a code stroke for altered mental status. Stroke team at the bedside on patient arrival. Labs attempted but unsuccessful. Patient cleared for CT by Dr. Rubin PayorPickering. Patient to CT with team. CT completed. Patient to D34. NIHSS 14, see documentation for details and code stroke times. Patient with nonfocal exam, muttering, nonsensical speech and moving all extremities on exam. Code stroke canceled. Bedside handoff with ED RN Pattricia BossAnnie.

## 2017-09-14 ENCOUNTER — Observation Stay (HOSPITAL_COMMUNITY): Payer: Medicare Other

## 2017-09-14 ENCOUNTER — Observation Stay (HOSPITAL_BASED_OUTPATIENT_CLINIC_OR_DEPARTMENT_OTHER): Payer: Medicare Other

## 2017-09-14 DIAGNOSIS — R4182 Altered mental status, unspecified: Secondary | ICD-10-CM | POA: Diagnosis not present

## 2017-09-14 DIAGNOSIS — R55 Syncope and collapse: Secondary | ICD-10-CM | POA: Diagnosis not present

## 2017-09-14 DIAGNOSIS — R410 Disorientation, unspecified: Secondary | ICD-10-CM | POA: Diagnosis not present

## 2017-09-14 DIAGNOSIS — R9389 Abnormal findings on diagnostic imaging of other specified body structures: Secondary | ICD-10-CM | POA: Diagnosis not present

## 2017-09-14 LAB — AMMONIA: Ammonia: 26 umol/L (ref 9–35)

## 2017-09-14 LAB — COMPREHENSIVE METABOLIC PANEL
ALT: 10 U/L (ref 0–44)
AST: 22 U/L (ref 15–41)
Albumin: 3.4 g/dL — ABNORMAL LOW (ref 3.5–5.0)
Alkaline Phosphatase: 60 U/L (ref 38–126)
Anion gap: 8 (ref 5–15)
BILIRUBIN TOTAL: 0.5 mg/dL (ref 0.3–1.2)
BUN: 9 mg/dL (ref 8–23)
CHLORIDE: 110 mmol/L (ref 98–111)
CO2: 26 mmol/L (ref 22–32)
Calcium: 9.4 mg/dL (ref 8.9–10.3)
Creatinine, Ser: 0.77 mg/dL (ref 0.44–1.00)
GFR calc Af Amer: >60 mL/min (ref >60)
GLUCOSE: 82 mg/dL (ref 70–99)
Potassium: 4.1 mmol/L (ref 3.5–5.1)
Sodium: 144 mmol/L (ref 135–145)
TOTAL PROTEIN: 6.9 g/dL (ref 6.5–8.1)

## 2017-09-14 LAB — CBC
HEMATOCRIT: 36.5 % (ref 36.0–46.0)
HEMOGLOBIN: 11.2 g/dL — AB (ref 12.0–15.0)
MCH: 27.3 pg (ref 26.0–34.0)
MCHC: 30.7 g/dL (ref 30.0–36.0)
MCV: 89 fL (ref 78.0–100.0)
Platelets: 164 10*3/uL (ref 150–400)
RBC: 4.1 MIL/uL (ref 3.87–5.11)
RDW: 14.5 % (ref 11.5–15.5)
WBC: 5 10*3/uL (ref 4.0–10.5)

## 2017-09-14 LAB — ECHOCARDIOGRAM COMPLETE: Weight: 2134.05 oz

## 2017-09-14 MED ORDER — ASPIRIN EC 81 MG PO TBEC
81.0000 mg | DELAYED_RELEASE_TABLET | Freq: Every day | ORAL | Status: DC
  Filled 2017-09-14: qty 1

## 2017-09-14 MED ORDER — ENOXAPARIN SODIUM 40 MG/0.4ML ~~LOC~~ SOLN: 40.0000 mg | SUBCUTANEOUS | Status: DC

## 2017-09-14 MED ORDER — OLANZAPINE 5 MG PO TBDP: 5.0000 mg | ORAL_TABLET | Freq: Every day | ORAL | Status: DC

## 2017-09-14 MED ORDER — VITAMIN D (ERGOCALCIFEROL) 1.25 MG (50000 UNIT) PO CAPS
50000.0000 [IU] | ORAL_CAPSULE | ORAL | Status: DC
  Administered 2017-09-14: 50000 [IU] via ORAL
  Filled 2017-09-14: qty 1

## 2017-09-14 MED ORDER — LISINOPRIL 10 MG PO TABS
10.0000 mg | ORAL_TABLET | Freq: Every day | ORAL | Status: DC
  Filled 2017-09-14: qty 1

## 2017-09-14 MED ORDER — ZINC OXIDE 11.3 % EX CREA
TOPICAL_CREAM | Freq: Three times a day (TID) | CUTANEOUS | Status: DC | PRN
  Filled 2017-09-14: qty 56

## 2017-09-14 MED ORDER — POLYVINYL ALCOHOL 1.4 % OP SOLN
1.0000 [drp] | Freq: Three times a day (TID) | OPHTHALMIC | Status: DC
  Administered 2017-09-14: 1 [drp] via OPHTHALMIC
  Filled 2017-09-14: qty 15

## 2017-09-14 MED ORDER — LACTULOSE 10 GM/15ML PO SOLN
20.0000 g | Freq: Two times a day (BID) | ORAL | Status: DC
  Filled 2017-09-14: qty 30

## 2017-09-14 MED ORDER — LEVOTHYROXINE SODIUM 25 MCG PO TABS
25.0000 ug | ORAL_TABLET | Freq: Every day | ORAL | Status: DC
  Filled 2017-09-14: qty 1

## 2017-09-14 MED ORDER — RIVASTIGMINE 9.5 MG/24HR TD PT24
9.5000 mg | MEDICATED_PATCH | Freq: Every day | TRANSDERMAL | Status: DC
  Administered 2017-09-14: 9.5 mg via TRANSDERMAL
  Filled 2017-09-14: qty 1

## 2017-09-14 MED ORDER — NITROFURANTOIN MONOHYD MACRO 100 MG PO CAPS
100.0000 mg | ORAL_CAPSULE | Freq: Every day | ORAL | Status: DC
  Filled 2017-09-14: qty 1

## 2017-09-14 NOTE — Evaluation (Signed)
Physical Therapy Evaluation Patient Details Name: Marissa Chung MRN: [id number] DOB: June 13, 1933 Today's Date: 09/14/2017   History of Present Illness  Pt is an 84 y/o female admitted secondary to AMS. Workup pending, but possible metabolic cause. CT negative for acute abnormality. PMH includes frontotemporal dementia and HTN.   Clinical Impression  Pt admitted secondary to problem above with deficits below. Pt occasionally answering questions, however, often going on tangents throughout session. Pt requiring multimodal cues for sequencing during basic mobility tasks and required mod to max A for mobility this session. Per RN and notes, pt from SNF, however, was able to walk occasionally. Will continue to follow acutely to maximize functional mobility independence and safety.     Follow Up Recommendations SNF;Supervision/Assistance - 24 hour    Equipment Recommendations  None recommended by PT    Recommendations for Other Services       Precautions / Restrictions Precautions Precautions: Fall Restrictions Weight Bearing Restrictions: No      Mobility  Bed Mobility Overal bed mobility: Needs Assistance Bed Mobility: Supine to Sit;Sit to Supine;Rolling Rolling: Max assist   Supine to sit: Max assist Sit to supine: Max assist   General bed mobility comments: Max A for LE assist and trunk elevation. Multimodal cues for sequencing and required simple direct cues to perform task. Max A to roll from side to side for clean up following incontinence.   Transfers Overall transfer level: Needs assistance Equipment used: 1 person hand held assist Transfers: Sit to/from Stand Sit to Stand: Max assist         General transfer comment: Max A for lift assist and sequencing to stand. PT stood in front of pt to assist with transfer.   Ambulation/Gait             General Gait Details: Unable this session   Stairs            Wheelchair Mobility    Modified Rankin  (Stroke Patients Only)       Balance Overall balance assessment: Needs assistance Sitting-balance support: No upper extremity supported;Feet supported Sitting balance-Leahy Scale: Poor Sitting balance - Comments: Reliant on min to mod A to maintain sitting balance.   Standing balance support: Bilateral upper extremity supported Standing balance-Leahy Scale: Poor Standing balance comment: Reliant on UE and external support                              Pertinent Vitals/Pain Pain Assessment: Faces Faces Pain Scale: No hurt    Home Living Family/patient expects to be discharged to:: Skilled nursing facility                 Additional Comments: Per RN and notes from SNF     Prior Function Level of Independence: Needs assistance   Gait / Transfers Assistance Needed: Per RN and notes, would walk occasionally. Pt unable to answer questions about PLOF.   ADL's / Homemaking Assistance Needed: Assume pt needed assist from staff at SNF.         Hand Dominance        Extremity/Trunk Assessment   Upper Extremity Assessment Upper Extremity Assessment: Generalized weakness    Lower Extremity Assessment Lower Extremity Assessment: Generalized weakness    Cervical / Trunk Assessment Cervical / Trunk Assessment: Kyphotic  Communication   Communication: HOH;Expressive difficulties(per notes, "will talk occasionally" )  Cognition Arousal/Alertness: Awake/alert Behavior During Therapy: Restless Overall Cognitive Status: No family/caregiver present  to determine baseline cognitive functioning                                 General Comments: Per notes, baseline dementia. Would occasionally respond to questions, however, would often go on tangents that were unrelated to session.       General Comments General comments (skin integrity, edema, etc.): No family present during session.     Exercises     Assessment/Plan    PT Assessment Patient  needs continued PT services  PT Problem List Decreased cognition;Decreased knowledge of precautions;Decreased safety awareness;Decreased knowledge of use of DME;Decreased strength;Decreased balance;Decreased mobility       PT Treatment Interventions DME instruction;Gait training;Functional mobility training;Therapeutic activities;Therapeutic exercise;Balance training;Patient/family education    PT Goals (Current goals can be found in the Care Plan section)  Acute Rehab PT Goals PT Goal Formulation: Patient unable to participate in goal setting Time For Goal Achievement: 09/28/17 Potential to Achieve Goals: Good    Frequency Min 2X/week   Barriers to discharge        Co-evaluation               AM-PAC PT "6 Clicks" Daily Activity  Outcome Measure Difficulty turning over in bed (including adjusting bedclothes, sheets and blankets)?: Unable Difficulty moving from lying on back to sitting on the side of the bed? : Unable Difficulty sitting down on and standing up from a chair with arms (e.g., wheelchair, bedside commode, etc,.)?: Unable Help needed moving to and from a bed to chair (including a wheelchair)?: A Lot Help needed walking in hospital room?: Total Help needed climbing 3-5 steps with a railing? : Total 6 Click Score: 7    End of Session   Activity Tolerance: Patient tolerated treatment well Patient left: in bed;with call bell/phone within reach;with bed alarm set Nurse Communication: Mobility status PT Visit Diagnosis: Unsteadiness on feet (R26.81);Muscle weakness (generalized) (M62.81);Other symptoms and signs involving the nervous system (Z61.096)    Time: 0454-0981 PT Time Calculation (min) (ACUTE ONLY): 16 min   Charges:   PT Evaluation $PT Eval Moderate Complexity: 1 Mod          Gladys Damme, PT, DPT  Acute Rehabilitation Services  Pager: [phone] Office: [phone]   Lehman Prom 09/14/2017, 12:04 PM

## 2017-09-14 NOTE — Evaluation (Signed)
Clinical/Bedside Swallow Evaluation Patient Details  Name: Marissa Chung MRN: [id number] Date of Birth: 12-17-1933  Today's Date: 09/14/2017 Time: SLP Start Time (ACUTE ONLY): 1040 SLP Stop Time (ACUTE ONLY): 1100 SLP Time Calculation (min) (ACUTE ONLY): 20 min  Past Medical History:  Past Medical History:  Diagnosis Date  . Cataract    BILATERAL  . Frontal lobe dementia   . History of shingles   . Hypertension    Past Surgical History:  Past Surgical History:  Procedure Laterality Date  . FINGER SURGERY Right    pinky finger   HPI:  84 year old female admitted 09/13/17 with stroke symptoms (dysarthria, confusion, syncope). PMH: frontotemporal dementia, recurrent UTI, HTN. CXR = no acute findings Head CT = no acute intracranial abnormality   Assessment / Plan / Recommendation Clinical Impression  Pt presents with adequate oral motor strength and function. Pt is missing dentition, and required cueing and encouragement to accept solid consistency. No overt s/s aspiration observed on any consistency presented. Recommend dys 2 diet and thin liquids. [facility] will follow for diet tolerance and education.  SLP Visit Diagnosis: Dysphagia, unspecified (R13.10)    Aspiration Risk  Mild aspiration risk    Diet Recommendation Dysphagia 2 (Fine chop);Thin liquid   Liquid Administration via: Straw;Cup Medication Administration: Crushed with puree Supervision: Staff to assist with self feeding;Full supervision/cueing for compensatory strategies Compensations: Minimize environmental distractions;Slow rate;Small sips/bites Postural Changes: Seated upright at 90 degrees;Remain upright for at least 30 minutes after po intake    Other  Recommendations Oral Care Recommendations: Oral care BID   Follow up Recommendations 24 hour supervision/assistance      Frequency and Duration min 2x/week  1 week;2 weeks       Prognosis Prognosis for Safe Diet  Advancement: Fair Barriers to Reach Goals: Cognitive deficits      Swallow Study   General Date of Onset: 09/13/17 HPI: 84 year old female admitted 09/13/17 with stroke symptoms (dysarthria, confusion, syncope). PMH: frontotemporal dementia, recurrent UTI, HTN. CXR = no acute findings Head CT = no acute intracranial abnormality Type of Study: Bedside Swallow Evaluation Previous Swallow Assessment: no prior BSE Diet Prior to this Study: NPO Temperature Spikes Noted: No Respiratory Status: Room air History of Recent Intubation: No Behavior/Cognition: Alert Oral Cavity Assessment: Within Functional Limits Oral Care Completed by SLP: No Oral Cavity - Dentition: Adequate natural dentition Vision: Functional for self-feeding Self-Feeding Abilities: Needs assist;Needs set up Patient Positioning: Upright in bed Baseline Vocal Quality: Normal Volitional Cough: Cognitively unable to elicit Volitional Swallow: Unable to elicit    Oral/Motor/Sensory Function Overall Oral Motor/Sensory Function: Within functional limits   Ice Chips Ice chips: Within functional limits Presentation: Spoon   Thin Liquid Thin Liquid: Within functional limits Presentation: Straw    Nectar Thick Nectar Thick Liquid: Not tested   Honey Thick Honey Thick Liquid: Not tested   Puree Puree: Within functional limits Presentation: Spoon   Solid     Solid: Within functional limits     Kelechi Astarita B. Murvin NatalBueche, [facility]ree[facility] HospitalMSP, CCC-SLP Speech Language Pathologist [phone]  Leigh AuroraBueche, Alajah Witman Brown 09/14/2017,11:03 AM

## 2017-09-14 NOTE — Clinical Social Work Note (Signed)
Clinical Social Work Assessment  Patient Details  Name: Marissa Chung MRN: [id number] Date of Birth: 12-27-1933  Date of referral:  09/14/17               Reason for consult:  Discharge Planning                Permission sought to share information with:  Facility Medical sales representativeContact Representative, Family Supports Permission granted to share information::  Yes, Verbal Permission Granted  Name::     Burna MortimerWanda  Agency::  [facility]  Relationship::  Niece  Contact Information:  [phone]  Housing/Transportation Living arrangements for the past 2 months:  Assisted Living Facility Source of Information:  Other (Comment Required)(Niece) Patient Interpreter Needed:  None Criminal Activity/Legal Involvement Pertinent to Current Situation/Hospitalization:  No - Comment as needed Significant Relationships:  Other Family Members Lives with:  Facility Resident Do you feel safe going back to the place where you live?  No Need for family participation in patient care:  Yes (Comment)  Care giving concerns:  CSW received consult regarding discharge plan. CSW spoke with patient's niece, Burna MortimerWanda. [facility] and will return there at discharge. CSW to continue to follow and assist with discharge planning needs.   Social Worker assessment / plan:  CSW spoke with patient's niece concerning return to [facility].  Employment status:  Retired Health and safety inspectornsurance information:  Medicare PT Recommendations:  Not assessed at this time Information / Referral to community resources:     Patient/Family's Response to care:  Patient's niece reports understanding of discharge plan and requests PTAR for transport. She has also hired extra caregivers to come work with the patient three times a week at the facility.   Patient/Family's Understanding of and Emotional Response to Diagnosis, Current Treatment, and Prognosis:  Patient/family is realistic regarding therapy needs and expressed  being hopeful for improved health. Patient's niece expressed understanding of CSW role and discharge process as well as medical condition. No questions/concerns about plan or treatment.    Emotional Assessment Appearance:  Appears stated age Attitude/Demeanor/Rapport:  Unable to Assess Affect (typically observed):  Unable to Assess Orientation:  (Disoriented x4) Alcohol / Substance use:  Not Applicable Psych involvement (Current and /or in the community):  No (Comment)  Discharge Needs  Concerns to be addressed:  Care Coordination Readmission within the last 30 days:  No Current discharge risk:  None Barriers to Discharge:  No Barriers Identified   Mearl Latinadia S Luigi Stuckey, LCSWA 09/14/2017, 11:05 AM

## 2017-09-14 NOTE — NC FL2 (Addendum)
MEDICAID FL2 LEVEL OF CARE SCREENING TOOL     IDENTIFICATION  Patient Name: Marissa Chung Birthdate: 1933/04/06 Sex: female Admission Date (Current Location): 02/21/2017  [facility] and [place] Number:  Producer, television/film/video and Address:  [facility] [place], [address], [place], [place] [address]      Provider Number: [id number]  Attending Physician Name and Address:  Maretta Bees, MD  Relative Name and Phone Number:  Moises Blood (Niece) [phone]    Current Level of Care: Hospital Recommended Level of Care: Memory Care Prior Approval Number:    Date Approved/Denied:   PASRR Number:    Discharge Plan: Other (Comment)(Memory Care)    Current Diagnoses: Patient Active Problem List   Diagnosis Date Noted  . Frontotemporal dementia Altered mental status 002/20/2019  . Palliative care by specialist   . Goals of care, counseling/discussion   . Encephalopathy 05/12/2017  . Syncope 03/10/2016  . Acute encephalopathy 08/27/2013  . Confusion 08/27/2013  . Accelerated hypertension 07/26/2013  .  07/26/2013  . Anemia 07/26/2013  . Dyslipidemia 07/26/2013    Orientation RESPIRATION BLADDER Height & Weight     (Not oriented x4)    Continent, External catheter Weight: 133 lb 6.1 oz (60.5 kg) Height:     BEHAVIORAL SYMPTOMS/MOOD NEUROLOGICAL BOWEL NUTRITION STATUS  Other (Comment)(Poor attention, concentration and judgment)   Continent Diet (mechanical soft)  AMBULATORY STATUS COMMUNICATION OF NEEDS Skin   Limited Assist Verbally Skin abrasions(abrasion left elbow)                       Personal Care Assistance Level of Assistance  Bathing, Feeding, Dressing, Total care Bathing Assistance: Limited assistance Feeding assistance: Independent Dressing Assistance: Limited assistance Total Care Assistance: Limited assistance   Functional Limitations Info  Sight, Hearing, Speech Sight Info: Adequate Hearing Info: Adequate Speech  Info: Adequate    SPECIAL CARE FACTORS FREQUENCY  PT (By licensed PT), OT (By licensed OT)     PT Frequency: 2x weekly OT Frequency: 2x weekly            Contractures Contractures Info: Not present    Additional Factors Info  Code Status, Allergies Code Status Info: Full Code Allergies Info: Allergies:  Shellfish-derived Products           Current Medications (09/14/2017):   Discharge Medications: TAKE these medications   acetaminophen 650 MG CR tablet Commonly known as:  TYLENOL Take 650 mg by mouth daily.   aspirin 81 MG EC tablet Take 1 tablet (81 mg total) by mouth daily.   BAZA EX Apply 1 application topically See admin instructions. Apply to skin 3x/day as needed. Apply to sacral area with each incontinence episode.   Feeding Supplies Misc Take 1 Can by mouth 3 (three) times daily.   lactulose 10 GM/15ML solution Commonly known as:  CHRONULAC Take 20 g by mouth 2 (two) times daily.   levothyroxine 25 MCG tablet Commonly known as:  SYNTHROID, LEVOTHROID Take 25 mcg by mouth daily before breakfast.   lisinopril 10 MG tablet Commonly known as:  PRINIVIL,ZESTRIL Take 10 mg by mouth daily.   megestrol 400 MG/10ML suspension Commonly known as:  MEGACE Take 10 mLs (400 mg total) by mouth daily.   nitrofurantoin (macrocrystal-monohydrate) 100 MG capsule Commonly known as:  MACROBID Take 100 mg by mouth at bedtime.   OLANZapine zydis 5 MG disintegrating tablet Commonly known as:  ZYPREXA Take 5 mg by mouth at bedtime.  risperiDONE 0.5 MG tablet Commonly known as:  RISPERDAL Take 1 tablet (0.5 mg total) by mouth at bedtime.   rivastigmine 9.5 mg/24hr Commonly known as:  EXELON Place 9.5 mg onto the skin daily.   SYSTANE 0.4-0.3 % Soln Generic drug:  Polyethyl Glycol-Propyl Glycol Place 1 drop into both eyes 3 (three) times daily.   Vitamin D (Ergocalciferol) 50000 units Caps capsule Commonly known as:  DRISDOL Take 50,000 Units  by mouth every 7 (seven) days.     Relevant Imaging Results:  Relevant Lab Results:   Additional Information SSN: [id number]      Add Palliative Care Services at facility  [facility] M Sinthia Karabin, LCSW

## 2017-09-14 NOTE — Progress Notes (Signed)
Patient was agitated trying to get out from the bed notified on call NP and given haldol 2 mg iv . She refused all the medicine. Patient is alert and disoriented x4.

## 2017-09-14 NOTE — Progress Notes (Signed)
  Echocardiogram 2D Echocardiogram has been performed.  Marissa SavoyCasey N Shonda Chung 09/14/2017, 9:21 AM

## 2017-09-14 NOTE — Progress Notes (Signed)
SLP Cancellation Note  Patient Details Name: Marissa Chung MRN: [id number] DOB: [birth year]   Cancelled treatment:       Reason Eval/Treat Not Completed: Patient at procedure or test/unavailable. Pt currently off unit. Will continue efforts.  Celia B. Murvin NatalBueche, [facility] Cl[facility] HospitalMSP, CCC-SLP Speech Language Pathologist [phone]  Leigh AuroraBueche, Celia Brown 09/14/2017, 9:47 AM

## 2017-09-14 NOTE — Progress Notes (Signed)
Patient received from ED Via bed. Patient is alert and oriented self.vital signs are stable. Patient is on low bed due to fall risk .iv in place and running infusion. Call bell in reach and side rail up x3.

## 2017-09-14 NOTE — Discharge Summary (Signed)
PATIENT DETAILS Name: Marissa Chung Age: 84 y.o. Sex: female Date of Birth: 02-13-1933 MRN: [id number]. Admitting Physician: Pearson Grippe, MD WUJ:[id number], Pcp Not In  Admit Date: 09/13/2017 Discharge date: 09/14/2017  Recommendations for Outpatient Follow-up:  1. Follow up with PCP in 1-2 weeks 2. Please obtain BMP/CBC in one week 3. Repeat 2 view chest x-ray in the next 1-2 weeks  Admitted From:  Memory care unit  Disposition: Memory care unit (per social work does not qualify for SNF)   Home Health: Yes  Equipment/Devices: None  Discharge Condition: Stable  CODE STATUS: FULL CODE  Diet recommendation:  Heart Healthy Dysphagia 2 (Fine chop);Thin liquid  Liquid Administration via: Straw;Cup Medication Administration: Crushed with puree Supervision: Staff to assist with self feeding;Full supervision/cueing for compensatory strategies Compensations: Minimize environmental distractions;Slow rate;Small sips/bites Postural Changes: Seated upright at 90 degrees;Remain upright for at least 30 minutes after po intake   Brief Summary: See H&P, Labs, Consult and Test reports for all details in brief, patient is a 84 year old female with history of dementia, hypertension who presented with worsening confusion.  Initially she was thought to have a code stroke-however this was quickly ruled out.  She was admitted for further evaluation and treatment.  Brief Hospital Course: Confusion: Suspect this is more of delirium related to underlying dementia.  CT head did not show any acute abnormalities.  Neurology was consulted and they did not recommend any further work-up.  She is pleasantly confused this morning-she is able to answer some of my questions appropriately, I suspect she is back to her baseline, no family at bedside-able to reach patient's niece over the phone as well.  Since she has improved-she is being discharged back to her memory care unit.  There is no evidence of UTI on  UA or evidence of pneumonia on chest x-ray.  Dementia with delirium: Continue Zyprexa, risperidone and Exelon  Hypothyroidism: Continue levothyroxine  Hypertension: Continue lisinopril  Possible abnormal chest x-ray: Though chest x-ray on 9/12 showed a possible nodular opacity in the left upper hemithorax, repeat chest x-ray on 9/13 did not show a nodule but unfortunately there was a cardiac lead over the region of interest-it is best to repeat another chest x-ray in the next 1 to 2 weeks.  Will defer this to the attending MD at SNF/memory care unit.  Procedures/Studies: 9/13>>Echo: Left ventricle:  The cavity size was normal. Wall thickness was normal. Systolic function was normal. The estimated ejection fraction was in the range of 60% to 65%. Wall motion was normal; there were no regional wall motion abnormalities. Doppler parameters are consistent with abnormal left ventricular relaxation (grade 1 diastolic dysfunction). There was no evidence of elevated ventricular filling pressure by Doppler parameters.  Discharge Diagnoses:  Principal Problem:   Altered mental status Active Problems:   Frontotemporal dementia   Syncope   Discharge Instructions:  Activity:  As tolerated with Full fall precautions use walker/cane & assistance as needed   Discharge Instructions    Diet - low sodium heart healthy   Complete by:  As directed    Dysphagia 2 (Fine chop);Thin liquid   Liquid Administration via: Straw;Cup Medication Administration: Crushed with puree Supervision: Staff to assist with self feeding;Full supervision/cueing for compensatory strategies Compensations: Minimize environmental distractions;Slow rate;Small sips/bites Postural Changes: Seated upright at 90 degrees;Remain upright for at least 30 minutes after po intake     Discharge instructions   Complete by:  As directed    Follow with Primary MD IN 1  WEEK  Please get a complete blood count and chemistry panel  checked by your Primary MD at your next visit, and again as instructed by your Primary MD.  Get Medicines reviewed and adjusted: Please take all your medications with you for your next visit with your Primary MD  Laboratory/radiological data: Please request your Primary MD to go over all hospital tests and procedure/radiological results at the follow up, please ask your Primary MD to get all Hospital records sent to his/her office.  In some cases, they will be blood work, cultures and biopsy results pending at the time of your discharge. Please request that your primary care M.D. follows up on these results.  Also Note the following: If you experience worsening of your admission symptoms, develop shortness of breath, life threatening emergency, suicidal or homicidal thoughts you must seek medical attention immediately by calling 911 or calling your MD immediately  if symptoms less severe.  You must read complete instructions/literature along with all the possible adverse reactions/side effects for all the Medicines you take and that have been prescribed to you. Take any new Medicines after you have completely understood and accpet all the possible adverse reactions/side effects.   Do not drive when taking Pain medications or sleeping medications (Benzodaizepines)  Do not take more than prescribed Pain, Sleep and Anxiety Medications. It is not advisable to combine anxiety,sleep and pain medications without talking with your primary care practitioner  Special Instructions: If you have smoked or chewed Tobacco  in the last 2 yrs please stop smoking, stop any regular Alcohol  and or any Recreational drug use.  Wear Seat belts while driving.  Please note: You were cared for by a hospitalist during your hospital stay. Once you are discharged, your primary care physician will handle any further medical issues. Please note that NO REFILLS for any discharge medications will be authorized once you are  discharged, as it is imperative that you return to your primary care physician (or establish a relationship with a primary care physician if you do not have one) for your post hospital discharge needs so that they can reassess your need for medications and monitor your lab values.   Increase activity slowly   Complete by:  As directed      Allergies as of 09/14/2017      Reactions   Shellfish-derived Products Other (See Comments)   Unknown documented from [facility]      Medication List    TAKE these medications   acetaminophen 650 MG CR tablet Commonly known as:  TYLENOL Take 650 mg by mouth daily.   aspirin 81 MG EC tablet Take 1 tablet (81 mg total) by mouth daily.   BAZA EX Apply 1 application topically See admin instructions. Apply to skin 3x/day as needed. Apply to sacral area with each incontinence episode.   Feeding Supplies Misc Take 1 Can by mouth 3 (three) times daily.   lactulose 10 GM/15ML solution Commonly known as:  CHRONULAC Take 20 g by mouth 2 (two) times daily.   levothyroxine 25 MCG tablet Commonly known as:  SYNTHROID, LEVOTHROID Take 25 mcg by mouth daily before breakfast.   lisinopril 10 MG tablet Commonly known as:  PRINIVIL,ZESTRIL Take 10 mg by mouth daily.   megestrol 400 MG/10ML suspension Commonly known as:  MEGACE Take 10 mLs (400 mg total) by mouth daily.   nitrofurantoin (macrocrystal-monohydrate) 100 MG capsule Commonly known as:  MACROBID Take 100 mg by mouth at bedtime.   OLANZapine zydis  5 MG disintegrating tablet Commonly known as:  ZYPREXA Take 5 mg by mouth at bedtime.   risperiDONE 0.5 MG tablet Commonly known as:  RISPERDAL Take 1 tablet (0.5 mg total) by mouth at bedtime.   rivastigmine 9.5 mg/24hr Commonly known as:  EXELON Place 9.5 mg onto the skin daily.   SYSTANE 0.4-0.3 % Soln Generic drug:  Polyethyl Glycol-Propyl Glycol Place 1 drop into both eyes 3 (three) times daily.   Vitamin D (Ergocalciferol) 50000 units  Caps capsule Commonly known as:  DRISDOL Take 50,000 Units by mouth every 7 (seven) days.      Contact information for after-discharge care    Destination    [facility] .   Service:  Assisted Living Contact information: [address] [place] [address] [phone]             Allergies  Allergen Reactions  . Shellfish-Derived Products Other (See Comments)    Unknown documented from [facility]    Consultations:   neurology  Other Procedures/Studies: Dg Chest 2 View  Result Date: 09/14/2017 CLINICAL DATA:  Possible nodular opacity overlying the left upper lung by prior chest x-ray. EXAM: CHEST - 2 VIEW COMPARISON:  2020-06-517 FINDINGS: Normal heart size. Stable atherosclerosis and tortuosity of the thoracic aorta. Although the nodular density is less difficult to appreciate on the current study, there is a cardiac monitoring lead very close to the left first rib and where the nodular density was present by prior chest x-ray. The frontal radiograph was performed with AP technique. There is no evidence of pulmonary edema, consolidation, pneumothorax or pleural fluid. IMPRESSION: No acute findings. The current frontal chest radiograph is impaired by the fact that there is a cardiac lead just over the region of interest of the previously identified possible nodule near the end of the first left rib and overlapping the lung. Recommend repeat true PA and lateral chest x-ray rather than AP and lateral and with cardiac monitoring leads removed. Electronically Signed   By: Irish Lack M.D.   On: 09/14/2017 09:49   Dg Chest 2 View  Result Date: 07-31-2017 CLINICAL DATA:  Patient with altered mental status. EXAM: CHEST - 2 VIEW COMPARISON:  None. FINDINGS: Monitoring leads overlie the patient. Normal scratch the cardiac contours upper limits of normal. Multiple monitoring leads overlie the patient. No large area pulmonary consolidation. Dense nodular opacity  projecting over the left hemithorax superiorly may represent overlapping costal cartilage. No pleural effusion or pneumothorax. Thoracic spine degenerative changes. IMPRESSION: Mild cardiomegaly.  No acute cardiopulmonary process. Nodular opacity projecting over the left upper hemithorax may represent overlapping rib/costal cartilage. Recommend further evaluation with repeat chest radiograph when patient is clinically able. Electronically Signed   By: Annia Belt M.D.   On: 2020-06-517 17:41   Ct Head Code Stroke Wo Contrast  Result Date: 07-31-2017 CLINICAL DATA:  Code stroke. Slurred speech focal neuro deficit, less than 6 hours, stroke suspected. EXAM: CT HEAD WITHOUT CONTRAST TECHNIQUE: Contiguous axial images were obtained from the base of the skull through the vertex without intravenous contrast. COMPARISON:  CT head without contrast 08/04/2017 FINDINGS: Brain: Moderate white matter changes are present bilaterally. There is some atrophy. No acute infarct, hemorrhage, or mass lesion is present. Basal ganglia are stable. Insular ribbon is intact bilaterally. No acute or focal cortical abnormality is present. Vascular: Atherosclerotic calcifications are present in the cavernous internal carotid arteries bilaterally. There is no asymmetric hyperdense vessel. Skull: Calvarium is intact. No focal lytic or  blastic lesions are present. Sinuses/Orbits: The paranasal sinuses and mastoid air cells are clear. Globes and orbits are within normal limits. ASPECTS [facility]([place] Stroke Program Early CT Score) - Ganglionic level infarction (caudate, lentiform nuclei, internal capsule, insula, M1-M3 cortex): 7/7 - Supraganglionic infarction (M4-M6 cortex): 3/3 Total score (0-10 with 10 being normal): 10/10 IMPRESSION: 1. No acute intracranial abnormality or significant interval change. 2. Stable atrophy and moderate white matter disease. This likely reflects the sequela of chronic microvascular ischemia. 3. ASPECTS is 10/10 The  above was relayed via text pager to Dr. Wilford CornerArora on 09/13/2017 at 16:13 . Electronically Signed   By: Marin Robertshristopher  Mattern M.D.   On: 09/13/2017 16:13     TODAY-DAY OF DISCHARGE:  Subjective:   Marissa Chung today remains pleasantly confused.  Objective:   Blood pressure (!) 188/118, pulse 96, temperature 98.7 F (37.1 C), resp. rate 18, weight 60.5 kg, SpO2 100 %.  Intake/Output Summary (Last 24 hours) at 09/14/2017 1352 Last data filed at 09/14/2017 0534 Gross per 24 hour  Intake 1023.34 ml  Output -  Net 1023.34 ml   Filed Weights   09/13/17 1632  Weight: 60.5 kg    Exam: Awake Alert, Oriented *3, No new F.N deficits, Normal affect [place].AT,PERRAL Supple Neck,No JVD, No cervical lymphadenopathy appriciated.  Symmetrical Chest wall movement, Good air movement bilaterally, CTAB RRR,No Gallops,Rubs or new Murmurs, No Parasternal Heave +ve B.Sounds, Abd Soft, Non tender, No organomegaly appriciated, No rebound -guarding or rigidity. No Cyanosis, Clubbing or edema, No new Rash or bruise   PERTINENT RADIOLOGIC STUDIES: Dg Chest 2 View  Result Date: 09/14/2017 CLINICAL DATA:  Possible nodular opacity overlying the left upper lung by prior chest x-ray. EXAM: CHEST - 2 VIEW COMPARISON:  09/13/2017 FINDINGS: Normal heart size. Stable atherosclerosis and tortuosity of the thoracic aorta. Although the nodular density is less difficult to appreciate on the current study, there is a cardiac monitoring lead very close to the left first rib and where the nodular density was present by prior chest x-ray. The frontal radiograph was performed with AP technique. There is no evidence of pulmonary edema, consolidation, pneumothorax or pleural fluid. IMPRESSION: No acute findings. The current frontal chest radiograph is impaired by the fact that there is a cardiac lead just over the region of interest of the previously identified possible nodule near the end of the first left rib and overlapping the  lung. Recommend repeat true PA and lateral chest x-ray rather than AP and lateral and with cardiac monitoring leads removed. Electronically Signed   By: Irish LackGlenn  Yamagata M.D.   On: 09/14/2017 09:49   Dg Chest 2 View  Result Date: 09/13/2017 CLINICAL DATA:  Patient with altered mental status. EXAM: CHEST - 2 VIEW COMPARISON:  None. FINDINGS: Monitoring leads overlie the patient. Normal scratch the cardiac contours upper limits of normal. Multiple monitoring leads overlie the patient. No large area pulmonary consolidation. Dense nodular opacity projecting over the left hemithorax superiorly may represent overlapping costal cartilage. No pleural effusion or pneumothorax. Thoracic spine degenerative changes. IMPRESSION: Mild cardiomegaly.  No acute cardiopulmonary process. Nodular opacity projecting over the left upper hemithorax may represent overlapping rib/costal cartilage. Recommend further evaluation with repeat chest radiograph when patient is clinically able. Electronically Signed   By: Annia Beltrew  Davis M.D.   On: 09/13/2017 17:41   Ct Head Code Stroke Wo Contrast  Result Date: 09/13/2017 CLINICAL DATA:  Code stroke. Slurred speech focal neuro deficit, less than 6 hours, stroke suspected. EXAM: CT HEAD WITHOUT  CONTRAST TECHNIQUE: Contiguous axial images were obtained from the base of the skull through the vertex without intravenous contrast. COMPARISON:  CT head without contrast 08/04/2017 FINDINGS: Brain: Moderate white matter changes are present bilaterally. There is some atrophy. No acute infarct, hemorrhage, or mass lesion is present. Basal ganglia are stable. Insular ribbon is intact bilaterally. No acute or focal cortical abnormality is present. Vascular: Atherosclerotic calcifications are present in the cavernous internal carotid arteries bilaterally. There is no asymmetric hyperdense vessel. Skull: Calvarium is intact. No focal lytic or blastic lesions are present. Sinuses/Orbits: The paranasal sinuses  and mastoid air cells are clear. Globes and orbits are within normal limits. ASPECTS [facility] Stroke Program Early CT Score) - Ganglionic level infarction (caudate, lentiform nuclei, internal capsule, insula, M1-M3 cortex): 7/7 - Supraganglionic infarction (M4-M6 cortex): 3/3 Total score (0-10 with 10 being normal): 10/10 IMPRESSION: 1. No acute intracranial abnormality or significant interval change. 2. Stable atrophy and moderate white matter disease. This likely reflects the sequela of chronic microvascular ischemia. 3. ASPECTS is 10/10 The above was relayed via text pager to Dr. Wilford Corner on 07/26/2017 at 16:13 . Electronically Signed   By: Marin Roberts M.D.   On: 007/25/2019 16:13     PERTINENT LAB RESULTS: CBC: Recent Labs    09/13/17 1618 09/13/17 1625 09/14/17 0630  WBC 7.1  --  5.0  HGB 10.7* 11.6* 11.2*  HCT 32.9* 34.0* 36.5  PLT PLATELET CLUMPS NOTED ON SMEAR, UNABLE TO ESTIMATE  --  164   CMET CMP     Component Value Date/Time   NA 144 09/14/2017 0630   K 4.1 09/14/2017 0630   CL 110 09/14/2017 0630   CO2 26 09/14/2017 0630   GLUCOSE 82 09/14/2017 0630   BUN 9 09/14/2017 0630   CREATININE 0.77 09/14/2017 0630   CALCIUM 9.4 09/14/2017 0630   PROT 6.9 09/14/2017 0630   ALBUMIN 3.4 (L) 09/14/2017 0630   AST 22 09/14/2017 0630   ALT 10 09/14/2017 0630   ALKPHOS 60 09/14/2017 0630   BILITOT 0.5 09/14/2017 0630   GFRNONAA >60 09/14/2017 0630   GFRAA >60 09/14/2017 0630    GFR Estimated Creatinine Clearance: 51.8 mL/min (by C-G formula based on SCr of 0.77 mg/dL). No results for input(s): LIPASE, AMYLASE in the last 72 hours. No results for input(s): CKTOTAL, CKMB, CKMBINDEX, TROPONINI in the last 72 hours. Invalid input(s): POCBNP No results for input(s): DDIMER in the last 72 hours. No results for input(s): HGBA1C in the last 72 hours. No results for input(s): CHOL, HDL, LDLCALC, TRIG, CHOLHDL, LDLDIRECT in the last 72 hours. Recent Labs    09/13/17 1806  TSH  2.215   No results for input(s): VITAMINB12, FOLATE, FERRITIN, TIBC, IRON, RETICCTPCT in the last 72 hours. Coags: Recent Labs    09/13/17 1602  INR 1.08   Microbiology: No results found for this or any previous visit (from the past 240 hour(s)).  FURTHER DISCHARGE INSTRUCTIONS:  Get Medicines reviewed and adjusted: Please take all your medications with you for your next visit with your Primary MD  Laboratory/radiological data: Please request your Primary MD to go over all hospital tests and procedure/radiological results at the follow up, please ask your Primary MD to get all Hospital records sent to his/her office.  In some cases, they will be blood work, cultures and biopsy results pending at the time of your discharge. Please request that your primary care M.D. goes through all the records of your hospital data and follows up  on these results.  Also Note the following: If you experience worsening of your admission symptoms, develop shortness of breath, life threatening emergency, suicidal or homicidal thoughts you must seek medical attention immediately by calling 911 or calling your MD immediately  if symptoms less severe.  You must read complete instructions/literature along with all the possible adverse reactions/side effects for all the Medicines you take and that have been prescribed to you. Take any new Medicines after you have completely understood and accpet all the possible adverse reactions/side effects.   Do not drive when taking Pain medications or sleeping medications (Benzodaizepines)  Do not take more than prescribed Pain, Sleep and Anxiety Medications. It is not advisable to combine anxiety,sleep and pain medications without talking with your primary care practitioner  Special Instructions: If you have smoked or chewed Tobacco  in the last 2 yrs please stop smoking, stop any regular Alcohol  and or any Recreational drug use.  Wear Seat belts while  driving.  Please note: You were cared for by a hospitalist during your hospital stay. Once you are discharged, your primary care physician will handle any further medical issues. Please note that NO REFILLS for any discharge medications will be authorized once you are discharged, as it is imperative that you return to your primary care physician (or establish a relationship with a primary care physician if you do not have one) for your post hospital discharge needs so that they can reassess your need for medications and monitor your lab values.  Total Time spent coordinating discharge including counseling, education and face to face time equals 25 minutes.  SignedJeoffrey Massed 09/14/2017 1:52 PM

## 2017-09-14 NOTE — Progress Notes (Signed)
[facility]

## 2017-09-14 NOTE — Progress Notes (Signed)
[facility] Anticipated DC date: 09/14/17 Family notified: Burna MortimerWanda Transport by: Sharin MonsPTAR   Per MD [facility]. RN, patient, patient's family, and facility notified of DC. Discharge Summary and FL2 sent to facility. RN given number for report [phone], Selena BattenKim). DC packet on chart. Ambulance transport requested for patient.   CSW signing off.  Cristobal GoldmannNadia Seferino Oscar, LCSW Clinical Social Worker [phone]

## 2017-09-16 ENCOUNTER — Emergency Department (HOSPITAL_COMMUNITY): Payer: Medicare Other

## 2017-09-16 ENCOUNTER — Other Ambulatory Visit: Payer: Self-pay

## 2017-09-16 ENCOUNTER — Encounter (HOSPITAL_COMMUNITY): Payer: Self-pay | Admitting: Emergency Medicine

## 2017-09-16 ENCOUNTER — Emergency Department (HOSPITAL_COMMUNITY)
Admission: EM | Admit: 2017-09-16 | Discharge: 2017-09-17 | Disposition: A | Discharge status: Skilled Nursing Facility | Payer: Medicare Other | Attending: Emergency Medicine | Admitting: Emergency Medicine

## 2017-09-16 DIAGNOSIS — S0990XA Unspecified injury of head, initial encounter: Secondary | ICD-10-CM | POA: Diagnosis not present

## 2017-09-16 DIAGNOSIS — W19XXXA Unspecified fall, initial encounter: Secondary | ICD-10-CM

## 2017-09-16 DIAGNOSIS — Z79899 Other long term (current) drug therapy: Secondary | ICD-10-CM | POA: Diagnosis not present

## 2017-09-16 DIAGNOSIS — W08XXXA Fall from other furniture, initial encounter: Secondary | ICD-10-CM | POA: Insufficient documentation

## 2017-09-16 DIAGNOSIS — Y92129 Unspecified place in nursing home as the place of occurrence of the external cause: Secondary | ICD-10-CM | POA: Insufficient documentation

## 2017-09-16 DIAGNOSIS — Z87891 Personal history of nicotine dependence: Secondary | ICD-10-CM | POA: Diagnosis not present

## 2017-09-16 DIAGNOSIS — Z23 Encounter for immunization: Secondary | ICD-10-CM | POA: Insufficient documentation

## 2017-09-16 DIAGNOSIS — Y9389 Activity, other specified: Secondary | ICD-10-CM | POA: Insufficient documentation

## 2017-09-16 DIAGNOSIS — Y999 Unspecified external cause status: Secondary | ICD-10-CM | POA: Diagnosis not present

## 2017-09-16 DIAGNOSIS — S0101XA Laceration without foreign body of scalp, initial encounter: Secondary | ICD-10-CM | POA: Diagnosis not present

## 2017-09-16 MED ORDER — TETANUS-DIPHTH-ACELL PERTUSSIS 5-2.5-18.5 LF-MCG/0.5 IM SUSP
0.5000 mL | Freq: Once | INTRAMUSCULAR | Status: AC
  Administered 2017-09-16: 0.5 mL via INTRAMUSCULAR
  Filled 2017-09-16: qty 0.5

## 2017-09-16 MED ORDER — LIDOCAINE-EPINEPHRINE-TETRACAINE (LET) SOLUTION
3.0000 mL | Freq: Once | NASAL | Status: AC
  Administered 2017-09-16: 3 mL via TOPICAL
  Filled 2017-09-16: qty 3

## 2017-09-16 NOTE — ED Notes (Signed)
Bed: [id number] Expected date:  Expected time:  Means of arrival:  Comments: 84 yo F/SNF Fall

## 2017-09-16 NOTE — ED Provider Notes (Signed)
[facility]-EMERGENCY DEPT Provider Note   CSN: [id number] Arrival date & time: 09/16/17  2029     History   Chief Complaint Chief Complaint  Patient presents with  . Fall    HPI Marissa Chung is a 84 y.o. female with history of dementia presenting from [place] placed for unwitnessed fall.  Patient slid backward off couch when staff was not looking striking the back of her head on a table.  Bleeding controlled by EMS prior to arrival with direct pressure.  Upon arrival patient is currently alert and oriented to self.  Patient had baseline per nursing staff. Patient answers direct questions, states that she is in no pain at this time.  She is moving all extremities spontaneously and without obvious signs of distress.  HPI  Past Medical History:  Diagnosis Date  . Cataract    BILATERAL  . Frontal lobe dementia   . History of shingles   . Hypertension     Patient Active Problem List   Diagnosis Date Noted  . Altered mental status 2020/11/517  . Palliative care by specialist   . Goals of care, counseling/discussion   . Encephalopathy 05/12/2017  . Syncope 03/10/2016  . Acute encephalopathy 08/27/2013  . Confusion 08/27/2013  . Accelerated hypertension 07/26/2013  . Frontotemporal dementia 07/26/2013  . Anemia 07/26/2013  . Dyslipidemia 07/26/2013    Past Surgical History:  Procedure Laterality Date  . FINGER SURGERY Right    pinky finger     OB History   None      Home Medications    Prior to Admission medications   Medication Sig Start Date End Date Taking? Authorizing Provider  acetaminophen (TYLENOL) 650 MG CR tablet Take 650 mg by mouth daily.    [provider]  aspirin EC 81 MG EC tablet Take 1 tablet (81 mg total) by mouth daily. 05/19/17   Narda Bonds, MD  Chloroxylenol-Zinc Oxide Jeanie Cooks EX) Apply 1 application topically See admin instructions. Apply to skin 3x/day as needed. Apply to sacral area with each  incontinence episode.    [provider]  Feeding Supplies MISC Take 1 Can by mouth 3 (three) times daily.    [provider]  lactulose (CHRONULAC) 10 GM/15ML solution Take 20 g by mouth 2 (two) times daily.    [provider]  levothyroxine (SYNTHROID, LEVOTHROID) 25 MCG tablet Take 25 mcg by mouth daily before breakfast.    [provider]  lisinopril (PRINIVIL,ZESTRIL) 10 MG tablet Take 10 mg by mouth daily.    [provider]  megestrol (MEGACE) 400 MG/10ML suspension Take 10 mLs (400 mg total) by mouth daily. Patient not taking: Reported on 07/15/2017 05/19/17   Narda Bonds, MD  nitrofurantoin, macrocrystal-monohydrate, (MACROBID) 100 MG capsule Take 100 mg by mouth at bedtime.    [provider]  OLANZapine zydis (ZYPREXA) 5 MG disintegrating tablet Take 5 mg by mouth at bedtime.    [provider]  Polyethyl Glycol-Propyl Glycol (SYSTANE) 0.4-0.3 % SOLN Place 1 drop into both eyes 3 (three) times daily.    [provider]  risperiDONE (RISPERDAL) 0.5 MG tablet Take 1 tablet (0.5 mg total) by mouth at bedtime. Patient not taking: Reported on 07/15/2017 05/18/17   Narda Bonds, MD  rivastigmine (EXELON) 9.5 mg/24hr Place 9.5 mg onto the skin daily.    [provider]  Vitamin D, Ergocalciferol, (DRISDOL) 50000 units CAPS capsule Take 50,000 Units by mouth every 7 (seven) days.  [provider]    Family History Family History  Family history unknown: Yes    Social History Social History   Tobacco Use  . Smoking status: Former Games developer  . Smokeless tobacco: Never Used  . Tobacco comment: quit smoking over 20 years ago   Substance Use Topics  . Alcohol use: No  . Drug use: No     Allergies   Shellfish-derived products   Review of Systems Review of Systems  Unable to perform ROS: Dementia     Physical Exam Updated Vital Signs BP 117/68   Pulse 78   Temp 98.4 F (36.9 C)  (Oral)   Resp 16   SpO2 100%   Physical Exam  Constitutional: She appears well-developed and well-nourished. No distress.  HENT:  Head: Normocephalic. Head is without raccoon's eyes and without Battle's sign.    Right Ear: Hearing, tympanic membrane, external ear and ear canal normal. No hemotympanum.  Left Ear: Hearing, tympanic membrane, external ear and ear canal normal. No hemotympanum.  Nose: Nose normal.  Mouth/Throat: Uvula is midline, oropharynx is clear and moist and mucous membranes are normal.  Eyes: Pupils are equal, round, and reactive to light. Conjunctivae and EOM are normal.  Neck: Trachea normal and normal range of motion. Neck supple. No spinous process tenderness and no muscular tenderness present. No tracheal deviation present.  Cardiovascular: Normal rate, regular rhythm, normal heart sounds and intact distal pulses.  Pulses:      Dorsalis pedis pulses are 2+ on the right side, and 2+ on the left side.       Posterior tibial pulses are 2+ on the right side, and 2+ on the left side.  Pulmonary/Chest: Effort normal and breath sounds normal. No respiratory distress. She exhibits no tenderness, no crepitus, no edema, no deformity and no swelling.  Abdominal: Soft. Normal appearance. There is no tenderness. There is no rigidity, no rebound and no guarding.  Musculoskeletal: Normal range of motion.       Right shoulder: Normal. She exhibits normal range of motion and no crepitus.       Left shoulder: Normal. She exhibits normal range of motion and no crepitus.       Right elbow: Normal.      Left elbow: Normal.       Right wrist: Normal.       Left wrist: Normal.       Right hip: Normal. She exhibits normal range of motion and no crepitus.       Left hip: Normal. She exhibits normal range of motion and no crepitus.       Right knee: Normal.       Left knee: Normal.       Right ankle: Normal.       Left ankle: Normal.       Cervical back: Normal.       Thoracic back:  Normal.       Lumbar back: Normal.  No midline spinal tenderness to palpation, no paraspinal muscle tenderness, no deformity, crepitus, or step-off noted.  Palpation and motion intact to all major joints.  No obvious signs of deformity or injury.  No pain with range of motion.  Knee-to-chest and external/internal rotation intact to both hips without pain or crepitus.  Patient able to lift arms above head, full range of motion of the shoulders without pain or crepitus.  Feet:  Right Foot:  Protective Sensation: 3 sites tested. 3 sites sensed.  Left Foot:  Protective  Sensation: 3 sites tested. 3 sites sensed.  Neurological: She is alert. She has normal strength. No sensory deficit. GCS eye subscore is 4. GCS verbal subscore is 3. GCS motor subscore is 6.  Mental Status: Alert, oriented to self. Able to follow simple commands with assistance. Cranial Nerves: II-XII: Grossly intact bilaterally. Motor: Normal tone. 5/5 strength in upper and lower extremities bilaterally including strong and equal grip strength and dorsiflexion/plantar flexion. Sensory: Sensation intact to light touch in all extremities. CV: distal pulses palpable throughout  Skin: Skin is warm and dry. Capillary refill takes less than 2 seconds. Laceration noted.  Psychiatric: She has a normal mood and affect. Her behavior is normal.    ED Treatments / Results  Labs (all labs ordered are listed, but only abnormal results are displayed) Labs Reviewed - No data to display  EKG None  Radiology Ct Head Wo Contrast  Result Date: 09/16/2017 CLINICAL DATA:  Patient status post fall. Patient with injury to the back of the head. EXAM: CT HEAD WITHOUT CONTRAST CT CERVICAL SPINE WITHOUT CONTRAST TECHNIQUE: Multidetector CT imaging of the head and cervical spine was performed following the standard protocol without intravenous contrast. Multiplanar CT image reconstructions of the cervical spine were also generated. COMPARISON:   CT head 09/13/2017 FINDINGS: CT HEAD FINDINGS Brain: Ventricles and sulci are appropriate for patient's age. Periventricular and subcortical white matter hypodensity compatible with chronic microvascular ischemic changes. No evidence for acute cortically based infarct, intracranial hemorrhage, mass lesion or mass-effect. Vascular: Unremarkable. Skull: Intact. Sinuses/Orbits: Paranasal sinuses well aerated. Mastoid air cells unremarkable. Orbits unremarkable. Other: Soft tissue swelling/injury overlying the dorsal calvarium. CT CERVICAL SPINE FINDINGS Alignment: Normal anatomic alignment. Skull base and vertebrae: Intact. Soft tissues and spinal canal: Unremarkable. Disc levels: Degenerative disc disease most pronounced C5-6 and C6-7. No acute fracture. Upper chest: Apical scarring. Other: None. IMPRESSION: No acute intracranial process. No acute cervical spine fracture. Electronically Signed   By: Annia Beltrew  Davis M.D.   On: 09/16/2017 23:20   Ct Cervical Spine Wo Contrast  Result Date: 09/16/2017 CLINICAL DATA:  Patient status post fall. Patient with injury to the back of the head. EXAM: CT HEAD WITHOUT CONTRAST CT CERVICAL SPINE WITHOUT CONTRAST TECHNIQUE: Multidetector CT imaging of the head and cervical spine was performed following the standard protocol without intravenous contrast. Multiplanar CT image reconstructions of the cervical spine were also generated. COMPARISON:  CT head 09/13/2017 FINDINGS: CT HEAD FINDINGS Brain: Ventricles and sulci are appropriate for patient's age. Periventricular and subcortical white matter hypodensity compatible with chronic microvascular ischemic changes. No evidence for acute cortically based infarct, intracranial hemorrhage, mass lesion or mass-effect. Vascular: Unremarkable. Skull: Intact. Sinuses/Orbits: Paranasal sinuses well aerated. Mastoid air cells unremarkable. Orbits unremarkable. Other: Soft tissue swelling/injury overlying the dorsal calvarium. CT CERVICAL SPINE  FINDINGS Alignment: Normal anatomic alignment. Skull base and vertebrae: Intact. Soft tissues and spinal canal: Unremarkable. Disc levels: Degenerative disc disease most pronounced C5-6 and C6-7. No acute fracture. Upper chest: Apical scarring. Other: None. IMPRESSION: No acute intracranial process. No acute cervical spine fracture. Electronically Signed   By: Annia Beltrew  Davis M.D.   On: 09/16/2017 23:20    Procedures .Marland Kitchen.Laceration Repair Date/Time: 09/16/2017 11:58 PM Performed by: Bill SalinasMorelli, Nilaya Bouie A, PA-C Authorized by: Bill SalinasMorelli, Braxdon Gappa A, PA-C   Consent:    Consent obtained:  Emergent situation Anesthesia (see MAR for exact dosages):    Anesthesia method:  Topical application   Topical anesthetic:  LET Laceration details:    Location:  Scalp  Scalp location:  Occipital   Length (cm):  2   Depth (mm):  3 Repair type:    Repair type:  Simple Pre-procedure details:    Preparation:  Patient was prepped and draped in usual sterile fashion Exploration:    Hemostasis achieved with:  LET and direct pressure   Wound exploration: wound explored through full range of motion and entire depth of wound probed and visualized     Wound extent: no foreign bodies/material noted, no muscle damage noted, no nerve damage noted, no tendon damage noted, no underlying fracture noted and no vascular damage noted     Contaminated: no   Treatment:    Area cleansed with:  Saline   Amount of cleaning:  Standard   Irrigation solution:  Sterile saline   Irrigation volume:  250cc   Irrigation method:  Pressure wash Skin repair:    Repair method:  Staples   Number of staples:  3 Approximation:    Approximation:  Close Post-procedure details:    Dressing:  Antibiotic ointment, non-adherent dressing and sterile dressing   Patient tolerance of procedure:  Tolerated well, no immediate complications   (including critical care time)  Medications Ordered in ED Medications  lidocaine-EPINEPHrine-tetracaine (LET)  solution (3 mLs Topical Given 09/16/17 2119)  Tdap (BOOSTRIX) injection 0.5 mL (0.5 mLs Intramuscular Given 09/16/17 2349)    Initial Impression / Assessment and Plan / ED Course  I have reviewed the triage vital signs and the nursing notes.  Pertinent labs & imaging results that were available during my care of the patient were reviewed by me and considered in my medical decision making (see chart for details).    Jisselle Poth is a 84 y.o. female who presents to ED for laceration of the occipital scalp after unwitnessed fall.  Patient at baseline per nursing home staff.  CT head and cervical spine negative for acute findings.  Patient well-appearing no acute distress.  All joints, long bones, torso and abdomen and back palpated no signs of injury aside from occipital laceration. Patient moving all extremities spontaneously without signs of distress. Wound thoroughly cleaned in ED today. Wound explored and bottom of wound seen in a bloodless field. Laceration repaired as dictated above.  No Tdap on file, given today. Noncontaminated scalp laceration thoroughly cleaned no antibiotics indicated at this time. Return precautions provided in AVS to care facility.  Patient to have staples removed in 7/10 days.  At this time there does not appear to be any evidence of an acute emergency medical condition and the patient appears stable for discharge with appropriate outpatient follow up.  Diagnosis and return precautions provided on AVS to nursing home staff.  Patient's case discussed with Dr. Ranae Palms who agrees with plan to discharge with follow-up.     Note: Portions of this report may have been transcribed using voice recognition software. Every effort was made to ensure accuracy; however, inadvertent computerized transcription errors may still be present.  Final Clinical Impressions(s) / ED Diagnoses   Final diagnoses:  Fall, initial encounter  Laceration of scalp, initial encounter     ED Discharge Orders    None       Marissa Chung 09/17/17 0127    Loren Racer, MD 09/21/17 [phone]

## 2017-09-16 NOTE — ED Triage Notes (Signed)
Pt BIB [facility] s/p [facility]itnessed [facility]. Per staff patient was sitting on the couch, slid down and hit the back of her head on the end table. No complaints of pain at this time, bleeding controlled. Patient somnolent due to it being her bedtime and night time medications.

## 2017-09-17 NOTE — Discharge Instructions (Addendum)
Please return to the Emergency Department for any new or worsening symptoms or if your symptoms do not improve. Please be sure to follow up with your Primary Care Physician as soon as possible regarding your visit today. If you do not have a Primary Doctor please use the resources below to establish one. Please take patient to see a medical provider in the next 7-10 days for staple removal and wound check.  Contact your doctor if: You have a fever. You have chills. You have redness, puffiness (swelling), or pain at the site of your wound. You have fluid, blood, or pus coming from your wound. There is a bad smell coming from your wound. The skin edges of your wound start to separate after your sutures have been removed. Your wound becomes thick, raised, and darker in color after your sutures come out (scarring). Get help right away if: You have: A very bad (severe) headache that is not helped by medicine. Trouble walking or weakness in your arms and legs. Clear or bloody fluid coming from your nose or ears. Changes in your seeing (vision). Jerky movements that you cannot control (seizure). You throw up (vomit). Your symptoms get worse. You lose balance. Your speech is slurred. You pass out. You are sleepier and have trouble staying awake. The black centers of your eyes (pupils) change in size.  RESOURCE GUIDE  Chronic Pain Problems: Contact [facility]  [phone] Patients need to be referred by their primary care doctor.  Insufficient Money for Medicine: Contact United Way:  call "211" or [facility] [phone].  No Primary Care Doctor: Call [facility] Connect  [phone] - can help you locate a primary care doctor that  accepts your insurance, provides certain services, etc. Physician Referral Service- [phone]  Agencies that provide inexpensive medical care: [facility]  [phone] [facility]  [phone] [facility]  [phone] [facility]  [phone] [facility]  [phone] [facility]  [phone]  Medicaid-accepting [facility] Providers: [facility]- [address], [address]  [phone], Mon-Fri 9am-7pm, Sat 9am-1pm [facility]- [address], [address] [place]  [phone] [facility]- [address], [address] [place]  [phone] [facility]- [address]  [phone] Renaye RakersVeita Bland- [address], [address], [phone]  Only accepts [place] patients after they have their name  applied to their card  Self Pay (no insurance) in [facility]: Sickle Cell Patients: Dr Willey BladeEric Dean, [facility]  [address], [phone] [facility]- [address]  [phone]       [facility]ge G[facility]-     [facility]- [address], [address]       -     [facility]- see information above (Speak to CitigroupPam H if you do not have insurance)       -  [facility]- [address], [phone]       -  [facility]- [address],  [phone]       -  [facility]- [address] [place] [address], [phone]       -  Dr Julio Sickssei-Bonsu-  [address], [address], [place], [phone]       -  [facility]- [address], [phone]       -  [facility]- [address] [phone], also [address], [phone]       -    [facility]- [address] [place], [phone], 1st & 3rd Saturday   every month, 10am-1pm  1) Find a Doctor and Pay Out of Pocket Although you won't have to find out who is covered by your insurance plan, it is a good idea to ask around and get recommendations. You will then need to call the office and see if the doctor you have chosen will accept you as a new patient and what types of options they offer for patients who are self-pay. Some doctors offer discounts or will set up  payment plans for their patients who do not have insurance, but you will need to ask so you aren't surprised when you get to your appointment.  2) Contact Your Local Health Department Not all health departments have doctors that can see patients for sick visits, but many do, so it is worth a call to see if yours does. If you don't know where your local health department is, you can check in your phone book. The CDC also has a tool to help you locate your state's health department, and many state websites also have listings of all of their local health departments.  3) Find a Walk-in Clinic If your illness is not likely to be very severe or complicated, you may want to try a walk in clinic. These are popping up all over the country in pharmacies, drugstores, and shopping centers. They're usually staffed by nurse practitioners or physician assistants that have been trained to treat common illnesses and complaints. They're usually fairly quick and inexpensive. However, if you have serious medical issues or chronic medical problems, these are probably not your best option  STD Testing [facility] Department of [facility], STD Clinic, [address], [place], phone [phone] or [phone].  Monday - Friday, call for an appointment. [facility] Department of [employer], STD Clinic, [address], [place], phone [phone] or [phone].  Monday - Friday, call for an appointment.  Abuse/Neglect: [facility] Child Abuse Hotline 306-346-0118 [facility] Child Abuse Hotline 4374463586 (After Hours)  Emergency Shelter:  [facility] 787-347-3301  Maternity Homes: [facility] [place] [facility] 531-872-4872 [facility] [phone]  MRSA Hotline #:   815-105-2617  [facility] Resources  [facility] [place]  United Way [facility] Dept. [address]                 [address]         [address]  [place]                                               Cristobal Goldmann Phone:  [phone]                                  Phone:  [phone]  Phone:  [place], [place]- [phone]       -     [facility] in [place], [address]                                  [place] [phone] or [phone] (After Hours)   [place]  Substance Abuse Resources: Alcohol and Drug Services  [place] 561-544-4844 [facility] Chinita Pester 667-456-5334 Residential & Outpatient Substance Abuse Program  336-228-8655  Psychological Services: [place]  [phone] [place]  [place], [place]. [address] [place], [place]: [phone] or [phone], PicCapture.uy  Dental Assistance  If unable to pay or uninsured, contact:  [facility] or [facility]. to become qualified for the adult dental clinic.  Patients with Medicaid: [facility] [phone] [address], [place] [address] [phone]  If unable to pay, or uninsured, contact [facility] [phone]) or [place] [phone] in [place], [place] in [facility]) to become qualified for the adult dental clinic   Other [place]- [place], [place], [place], [address], [place], [place], 2nd and 4th Thursday of the month at 6:30am.  10 clients each day by appointment, can sometimes see walk-in patients if someone does not show for an appointment. [facility]- [address], [place], [place], [address], [place], [place], [place], [address], [place] Department- [phone] [facility] [phone]

## 2017-09-17 NOTE — ED Notes (Signed)
[facility] contacted for transport 

## 2017-09-17 NOTE — ED Notes (Addendum)
[facility] at bedside. Patient unable to sign for discharge due to dementia.

## 2017-11-22 ENCOUNTER — Emergency Department (HOSPITAL_COMMUNITY): Payer: Medicare Other

## 2017-11-22 ENCOUNTER — Other Ambulatory Visit: Payer: Self-pay

## 2017-11-22 ENCOUNTER — Encounter (HOSPITAL_COMMUNITY): Payer: Self-pay

## 2017-11-22 ENCOUNTER — Emergency Department (HOSPITAL_COMMUNITY)
Admission: EM | Admit: 2017-11-22 | Discharge: 2017-11-22 | Disposition: A | Discharge status: Skilled Nursing Facility | Payer: Medicare Other | Attending: Emergency Medicine | Admitting: Emergency Medicine

## 2017-11-22 DIAGNOSIS — Z79899 Other long term (current) drug therapy: Secondary | ICD-10-CM | POA: Insufficient documentation

## 2017-11-22 DIAGNOSIS — G3109 Other frontotemporal dementia: Secondary | ICD-10-CM | POA: Insufficient documentation

## 2017-11-22 DIAGNOSIS — S025XXA Fracture of tooth (traumatic), initial encounter for closed fracture: Secondary | ICD-10-CM | POA: Insufficient documentation

## 2017-11-22 DIAGNOSIS — Z7982 Long term (current) use of aspirin: Secondary | ICD-10-CM | POA: Diagnosis not present

## 2017-11-22 DIAGNOSIS — S0993XA Unspecified injury of face, initial encounter: Secondary | ICD-10-CM | POA: Diagnosis present

## 2017-11-22 DIAGNOSIS — Y939 Activity, unspecified: Secondary | ICD-10-CM | POA: Diagnosis not present

## 2017-11-22 DIAGNOSIS — W19XXXA Unspecified fall, initial encounter: Secondary | ICD-10-CM | POA: Insufficient documentation

## 2017-11-22 DIAGNOSIS — Y999 Unspecified external cause status: Secondary | ICD-10-CM | POA: Insufficient documentation

## 2017-11-22 DIAGNOSIS — N3090 Cystitis, unspecified without hematuria: Secondary | ICD-10-CM | POA: Diagnosis not present

## 2017-11-22 DIAGNOSIS — Y92129 Unspecified place in nursing home as the place of occurrence of the external cause: Secondary | ICD-10-CM | POA: Insufficient documentation

## 2017-11-22 DIAGNOSIS — Z87891 Personal history of nicotine dependence: Secondary | ICD-10-CM | POA: Diagnosis not present

## 2017-11-22 DIAGNOSIS — I10 Essential (primary) hypertension: Secondary | ICD-10-CM | POA: Insufficient documentation

## 2017-11-22 DIAGNOSIS — E041 Nontoxic single thyroid nodule: Secondary | ICD-10-CM | POA: Diagnosis not present

## 2017-11-22 DIAGNOSIS — S0083XA Contusion of other part of head, initial encounter: Secondary | ICD-10-CM | POA: Diagnosis not present

## 2017-11-22 LAB — CBC WITH DIFFERENTIAL/PLATELET
Abs Immature Granulocytes: 0.02 10*3/uL (ref 0.00–0.07)
BASOS ABS: 0 10*3/uL (ref 0.0–0.1)
Basophils Relative: 0 %
Eosinophils Absolute: 0.1 10*3/uL (ref 0.0–0.5)
Eosinophils Relative: 2 %
HEMATOCRIT: 36.6 % (ref 36.0–46.0)
Hemoglobin: 11.4 g/dL — ABNORMAL LOW (ref 12.0–15.0)
Immature Granulocytes: 0 %
LYMPHS ABS: 0.8 10*3/uL (ref 0.7–4.0)
LYMPHS PCT: 14 %
MCH: 27.1 pg (ref 26.0–34.0)
MCHC: 31.1 g/dL (ref 30.0–36.0)
MCV: 86.9 fL (ref 80.0–100.0)
Monocytes Absolute: 0.5 10*3/uL (ref 0.1–1.0)
Monocytes Relative: 8 %
NEUTROS ABS: 4.1 10*3/uL (ref 1.7–7.7)
NRBC: 0 % (ref 0.0–0.2)
Neutrophils Relative %: 76 %
Platelets: 189 10*3/uL (ref 150–400)
RBC: 4.21 MIL/uL (ref 3.87–5.11)
RDW: 15.2 % (ref 11.5–15.5)
WBC: 5.4 10*3/uL (ref 4.0–10.5)

## 2017-11-22 LAB — URINALYSIS, ROUTINE W REFLEX MICROSCOPIC
Bilirubin Urine: NEGATIVE
Glucose, UA: NEGATIVE mg/dL
Ketones, ur: NEGATIVE mg/dL
Nitrite: NEGATIVE
PH: 7 (ref 5.0–8.0)
Protein, ur: NEGATIVE mg/dL
Specific Gravity, Urine: 1.008 (ref 1.005–1.030)

## 2017-11-22 LAB — BASIC METABOLIC PANEL
Anion gap: 8 (ref 5–15)
BUN: 13 mg/dL (ref 8–23)
CALCIUM: 9.5 mg/dL (ref 8.9–10.3)
CHLORIDE: 106 mmol/L (ref 98–111)
CO2: 29 mmol/L (ref 22–32)
Creatinine, Ser: 0.86 mg/dL (ref 0.44–1.00)
GFR calc non Af Amer: >60 mL/min (ref >60)
Glucose, Bld: 93 mg/dL (ref 70–99)
Potassium: 3.7 mmol/L (ref 3.5–5.1)
Sodium: 143 mmol/L (ref 135–145)

## 2017-11-22 LAB — CK: Total CK: 65 U/L (ref 38–234)

## 2017-11-22 MED ORDER — CEPHALEXIN 500 MG PO CAPS: 500.0000 mg | ORAL_CAPSULE | Freq: Two times a day (BID) | ORAL | 0 refills | Status: DC

## 2017-11-22 NOTE — ED Triage Notes (Signed)
Pt [facility] brought by [facility] due to an unwitnessed fall. Pt complains of pain on left side of face. Pt has dementia.

## 2017-11-22 NOTE — ED Notes (Signed)
[facility] contacted for transport 

## 2017-11-22 NOTE — ED Notes (Signed)
EKG given to EDP,Molpus,MD., for review. 

## 2017-11-22 NOTE — ED Notes (Signed)
Pt's niece Burna MortimerWanda called for an update on her care. Update provided and niece states she is on the way to hospital.

## 2017-11-22 NOTE — Discharge Instructions (Signed)
Your CT showed a thyroid nodule. Please follow up with your pcp in regards to this.  Please take antibiotics as prescribed for urinary tract infection.  If you develop worsening or new concerning symptoms you can return to the emergency department for re-evaluation.

## 2017-11-22 NOTE — ED Provider Notes (Signed)
[facility] Note   CSN: [id number] Arrival date & time: 11/22/17  0354     History   Chief Complaint Chief Complaint  Patient presents with  . Fall   Level 5 caveat-dementia HPI Marissa Chung is a 84 y.o. female with a history of hypertension and dementia currently residing at [facility] nursing facility presenting for fall.  Level 5 caveat secondary to dementia.  History is provided by nursing staff and EMS reports.  Patient reportedly found prone at nursing facility after an unwitnessed fall.  Patient was found to have dried blood at the scene.  This appears to be from a dental trauma to the left upper incisor.  Bleeding has since resolved.  Patient is at her baseline per report.  It appears patient has been here several times in the past for falls.  They are unsure how long the patient was down for.  Patient denies any complaints.  Patient's tetanus is up-to-date.  HPI  Past Medical History:  Diagnosis Date  . Cataract    BILATERAL  . Frontal lobe dementia (HCC)   . History of shingles   . Hypertension     Patient Active Problem List   Diagnosis Date Noted  . Altered mental status 09/13/2017  . Palliative care by specialist   . Goals of care, counseling/discussion   . Encephalopathy 05/12/2017  . Syncope 03/10/2016  . Acute encephalopathy 08/27/2013  . Confusion 08/27/2013  . Accelerated hypertension 07/26/2013  . Frontotemporal dementia (HCC) 07/26/2013  . Anemia 07/26/2013  . Dyslipidemia 07/26/2013    Past Surgical History:  Procedure Laterality Date  . FINGER SURGERY Right    pinky finger     OB History   None      Home Medications    Prior to Admission medications   Medication Sig Start Date End Date Taking? Authorizing Provider  acetaminophen (TYLENOL) 650 MG CR tablet Take 650 mg by mouth daily.    [provider]  aspirin EC 81 MG EC tablet Take 1 tablet (81 mg total) by mouth daily. 05/19/17    Narda Bonds, MD  Chloroxylenol-Zinc Oxide Jeanie Cooks EX) Apply 1 application topically See admin instructions. Apply to skin 3x/day as needed. Apply to sacral area with each incontinence episode.    [provider]  Feeding Supplies MISC Take 1 Can by mouth 3 (three) times daily.    [provider]  lactulose (CHRONULAC) 10 GM/15ML solution Take 20 g by mouth 2 (two) times daily.    [provider]  levothyroxine (SYNTHROID, LEVOTHROID) 25 MCG tablet Take 25 mcg by mouth daily before breakfast.    [provider]  lisinopril (PRINIVIL,ZESTRIL) 10 MG tablet Take 10 mg by mouth daily.    [provider]  megestrol (MEGACE) 400 MG/10ML suspension Take 10 mLs (400 mg total) by mouth daily. Patient not taking: Reported on 07/15/2017 05/19/17   Narda Bonds, MD  nitrofurantoin, macrocrystal-monohydrate, (MACROBID) 100 MG capsule Take 100 mg by mouth at bedtime.    [provider]  OLANZapine zydis (ZYPREXA) 5 MG disintegrating tablet Take 5 mg by mouth at bedtime.    [provider]  Polyethyl Glycol-Propyl Glycol (SYSTANE) 0.4-0.3 % SOLN Place 1 drop into both eyes 3 (three) times daily.    [provider]  risperiDONE (RISPERDAL) 0.5 MG tablet Take 1 tablet (0.5 mg total) by mouth at bedtime. Patient not taking: Reported on 07/15/2017 05/18/17   Narda Bonds, MD  rivastigmine (  EXELON) 9.5 mg/24hr Place 9.5 mg onto the skin daily.    [provider]  Vitamin D, Ergocalciferol, (DRISDOL) 50000 units CAPS capsule Take 50,000 Units by mouth every 7 (seven) days.    [provider]    Family History Family History  Family history unknown: Yes    Social History Social History   Tobacco Use  . Smoking status: Former Games developer  . Smokeless tobacco: Never Used  . Tobacco comment: quit smoking over 20 years ago   Substance Use Topics  . Alcohol use: No  . Drug use: No     Allergies   Shellfish-derived  products   Review of Systems Review of Systems  Unable to perform ROS: Dementia     Physical Exam Updated Vital Signs BP (!) 221/85 (BP Location: Left Arm) Comment: pt. blood pressure elevated,RN, Amber made aware.  Pulse 73   Temp 98 F (36.7 C) (Oral)   Resp 16   SpO2 100%   Physical Exam  Constitutional: She appears well-developed and well-nourished.  Pleasantly demented female in no acute distress  HENT:  Head: Normocephalic. Head is without raccoon's eyes and without Battle's sign.    Right Ear: External ear normal. No hemotympanum.  Left Ear: External ear normal. No hemotympanum.  Nose: Nose normal. No nose lacerations, sinus tenderness or nasal septal hematoma.  Mouth/Throat: Uvula is midline, oropharynx is clear and moist and mucous membranes are normal. No tonsillar exudate.    Eyes: Pupils are equal, round, and reactive to light. Right eye exhibits no discharge. Left eye exhibits no discharge. No scleral icterus.  Cataracts  Neck: Trachea normal. Neck supple. No spinous process tenderness present. No neck rigidity. Normal range of motion present.  Cardiovascular: Normal rate, regular rhythm and intact distal pulses.  No murmur heard. Pulses:      Radial pulses are 2+ on the right side, and 2+ on the left side.       Dorsalis pedis pulses are 2+ on the right side, and 2+ on the left side.       Posterior tibial pulses are 2+ on the right side, and 2+ on the left side.  No lower extremity swelling or edema. Calves symmetric in size bilaterally.  Pulmonary/Chest: Effort normal and breath sounds normal. She exhibits no tenderness.  Abdominal: Soft. Bowel sounds are normal. There is no tenderness. There is no rebound and no guarding.  Musculoskeletal: She exhibits no edema.  Passively ranges upper and lower extremity without pain or difficulty.  Negative logroll test.  No leg shortening or external rotation.  No sacral crepitus.  No tenderness to palpation of the  cervical, thoracic or lumbar spine.  No deformities noted to the upper lower extremity.  Compartments soft upper and lower extremities.  Lymphadenopathy:    She has no cervical adenopathy.  Neurological: She is alert.  Patient alert.  She is oriented to self.  Moves all 4 extremities without difficulty.  Skin: Skin is warm and dry. No rash noted. She is not diaphoretic.  Psychiatric: She has a normal mood and affect.  Nursing note and vitals reviewed.    ED Treatments / Results  Labs (all labs ordered are listed, but only abnormal results are displayed) Labs Reviewed  CBC WITH DIFFERENTIAL/PLATELET - Abnormal; Notable for the following components:      Result Value   Hemoglobin 11.4 (*)    All other components within normal limits  URINE CULTURE  URINALYSIS, ROUTINE W REFLEX MICROSCOPIC  BASIC METABOLIC  PANEL  CK    EKG EKG Interpretation  Date/Time:  Thursday November 22 2017 04:42:18 EST Ventricular Rate:  70 PR Interval:    QRS Duration: 105 QT Interval:  396 QTC Calculation: 428 R Axis:   77 Text Interpretation:  Normal sinus rhythm No significant change was found Confirmed by Paula Libra ([id number]) on 11/22/2017 4:48:12 AM   Radiology Ct Head Wo Contrast  Result Date: 11/22/2017 CLINICAL DATA:  84 y/o F; unwitnessed fall with left-sided facial pain. EXAM: CT HEAD WITHOUT CONTRAST CT MAXILLOFACIAL WITHOUT CONTRAST CT CERVICAL SPINE WITHOUT CONTRAST TECHNIQUE: Multidetector CT imaging of the head, cervical spine, and maxillofacial structures were performed using the standard protocol without intravenous contrast. Multiplanar CT image reconstructions of the cervical spine and maxillofacial structures were also generated. COMPARISON:  09/16/2017 CT head and cervical spine. 07/16/2017 CT maxillofacial. FINDINGS: CT HEAD FINDINGS Brain: No evidence of acute infarction, hemorrhage, hydrocephalus, extra-axial collection or mass lesion/mass effect. Stable chronic microvascular  ischemic changes and volume loss of the brain. Vascular: Calcific atherosclerosis of the carotid siphons. No hyperdense vessel identified. Skull: Normal. Negative for fracture or focal lesion. Other: None. CT MAXILLOFACIAL FINDINGS Osseous: No fracture or mandibular dislocation. No destructive process. Extensive dental disease with multiple dental caries and absent dental crowns. Orbits: Negative. No traumatic or inflammatory finding. Sinuses: Clear. Soft tissues: Left facial superficial soft tissue contusion and small hematoma. CT CERVICAL SPINE FINDINGS Alignment: Normal. Skull base and vertebrae: No acute fracture. No primary bone lesion or focal pathologic process. Soft tissues and spinal canal: No prevertebral fluid or swelling. No visible canal hematoma. Disc levels: Mild discogenic degenerative changes at the C5-C7 levels with loss of intervertebral disc space height and endplate marginal osteophytes. No significant bony foraminal or canal stenosis. Upper chest: Negative. Other: Stable 17 mm nodule within the left lobe of the thyroid. IMPRESSION: CT head: 1. No acute intracranial abnormality or calvarial fracture. 2. Stable chronic microvascular ischemic changes and volume loss of the brain. CT maxillofacial: 1. Left facial superficial soft tissue contusion and small hematoma. 2. No acute facial fracture or mandibular dislocation. 3. Extensive dental disease with multiple dental caries and absent dental crowns. CT CERVICAL SPINE: 1. No acute fracture or dislocation. 2. Stable 17 mm nodule in the left lobe of the thyroid. This can be further characterized with thyroid ultrasound if clinically indicated. Electronically Signed   By: Mitzi Hansen M.D.   On: 11/22/2017 06:10   Ct Cervical Spine Wo Contrast  Result Date: 11/22/2017 CLINICAL DATA:  84 y/o F; unwitnessed fall with left-sided facial pain. EXAM: CT HEAD WITHOUT CONTRAST CT MAXILLOFACIAL WITHOUT CONTRAST CT CERVICAL SPINE WITHOUT  CONTRAST TECHNIQUE: Multidetector CT imaging of the head, cervical spine, and maxillofacial structures were performed using the standard protocol without intravenous contrast. Multiplanar CT image reconstructions of the cervical spine and maxillofacial structures were also generated. COMPARISON:  09/16/2017 CT head and cervical spine. 07/16/2017 CT maxillofacial. FINDINGS: CT HEAD FINDINGS Brain: No evidence of acute infarction, hemorrhage, hydrocephalus, extra-axial collection or mass lesion/mass effect. Stable chronic microvascular ischemic changes and volume loss of the brain. Vascular: Calcific atherosclerosis of the carotid siphons. No hyperdense vessel identified. Skull: Normal. Negative for fracture or focal lesion. Other: None. CT MAXILLOFACIAL FINDINGS Osseous: No fracture or mandibular dislocation. No destructive process. Extensive dental disease with multiple dental caries and absent dental crowns. Orbits: Negative. No traumatic or inflammatory finding. Sinuses: Clear. Soft tissues: Left facial superficial soft tissue contusion and small hematoma. CT CERVICAL SPINE FINDINGS Alignment:  Normal. Skull base and vertebrae: No acute fracture. No primary bone lesion or focal pathologic process. Soft tissues and spinal canal: No prevertebral fluid or swelling. No visible canal hematoma. Disc levels: Mild discogenic degenerative changes at the C5-C7 levels with loss of intervertebral disc space height and endplate marginal osteophytes. No significant bony foraminal or canal stenosis. Upper chest: Negative. Other: Stable 17 mm nodule within the left lobe of the thyroid. IMPRESSION: CT head: 1. No acute intracranial abnormality or calvarial fracture. 2. Stable chronic microvascular ischemic changes and volume loss of the brain. CT maxillofacial: 1. Left facial superficial soft tissue contusion and small hematoma. 2. No acute facial fracture or mandibular dislocation. 3. Extensive dental disease with multiple dental  caries and absent dental crowns. CT CERVICAL SPINE: 1. No acute fracture or dislocation. 2. Stable 17 mm nodule in the left lobe of the thyroid. This can be further characterized with thyroid ultrasound if clinically indicated. Electronically Signed   By: Mitzi Hansen M.D.   On: 11/22/2017 06:10   Ct Maxillofacial Wo Cm  Result Date: 11/22/2017 CLINICAL DATA:  84 y/o F; unwitnessed fall with left-sided facial pain. EXAM: CT HEAD WITHOUT CONTRAST CT MAXILLOFACIAL WITHOUT CONTRAST CT CERVICAL SPINE WITHOUT CONTRAST TECHNIQUE: Multidetector CT imaging of the head, cervical spine, and maxillofacial structures were performed using the standard protocol without intravenous contrast. Multiplanar CT image reconstructions of the cervical spine and maxillofacial structures were also generated. COMPARISON:  09/16/2017 CT head and cervical spine. 07/16/2017 CT maxillofacial. FINDINGS: CT HEAD FINDINGS Brain: No evidence of acute infarction, hemorrhage, hydrocephalus, extra-axial collection or mass lesion/mass effect. Stable chronic microvascular ischemic changes and volume loss of the brain. Vascular: Calcific atherosclerosis of the carotid siphons. No hyperdense vessel identified. Skull: Normal. Negative for fracture or focal lesion. Other: None. CT MAXILLOFACIAL FINDINGS Osseous: No fracture or mandibular dislocation. No destructive process. Extensive dental disease with multiple dental caries and absent dental crowns. Orbits: Negative. No traumatic or inflammatory finding. Sinuses: Clear. Soft tissues: Left facial superficial soft tissue contusion and small hematoma. CT CERVICAL SPINE FINDINGS Alignment: Normal. Skull base and vertebrae: No acute fracture. No primary bone lesion or focal pathologic process. Soft tissues and spinal canal: No prevertebral fluid or swelling. No visible canal hematoma. Disc levels: Mild discogenic degenerative changes at the C5-C7 levels with loss of intervertebral disc space  height and endplate marginal osteophytes. No significant bony foraminal or canal stenosis. Upper chest: Negative. Other: Stable 17 mm nodule within the left lobe of the thyroid. IMPRESSION: CT head: 1. No acute intracranial abnormality or calvarial fracture. 2. Stable chronic microvascular ischemic changes and volume loss of the brain. CT maxillofacial: 1. Left facial superficial soft tissue contusion and small hematoma. 2. No acute facial fracture or mandibular dislocation. 3. Extensive dental disease with multiple dental caries and absent dental crowns. CT CERVICAL SPINE: 1. No acute fracture or dislocation. 2. Stable 17 mm nodule in the left lobe of the thyroid. This can be further characterized with thyroid ultrasound if clinically indicated. Electronically Signed   By: Mitzi Hansen M.D.   On: 11/22/2017 06:10    Procedures Procedures (including critical care time)  Medications Ordered in ED Medications - No data to display   Initial Impression / Assessment and Plan / ED Course  I have reviewed the triage vital signs and the nursing notes.  Pertinent labs & imaging results that were available during my care of the patient were reviewed by me and considered in my medical decision making (see  chart for details).     84 [facility] after an unwitnessed fall with an unknown period of time down.  Patient noted to have facial trauma.  She does have swelling to the left cheek and noted trauma to the left upper teeth as described above.  Patient is not on any blood thinners.  She is at her baseline.  Given unwitnessed fall and head trauma will get CT head, neck & maxillofacial.  Given unknown period of time down and fall being unwitnessed will get screening lab work (CBC, BMP, CK, UA) and ekg. Tetanus is up to date.   Patient lab work significant for UTI. Otherwise reassuring. Will tx with keflex. Urine cx sent. CT scans are  reassuring.  There is noted to be a thyroid nodule which will place on patient's discharge instructions and have follow-up with PCP.  No further work-up indicated.  Patient appears stable for discharge.  Patient case seen and discussed with Dr. Read DriversMolpus who is in agreement with plan.    Final Clinical Impressions(s) / ED Diagnoses   Final diagnoses:  Fall, initial encounter  Thyroid nodule  Cystitis    ED Discharge Orders         Ordered    cephALEXin (KEFLEX) 500 MG capsule  2 times daily     11/22/17 0651           Jacinto HalimMaczis, Doreena Maulden M, PA-C 11/22/17 0652    Molpus, Jonny RuizJohn, MD 11/22/17 [phone]

## 2017-11-22 NOTE — ED Notes (Signed)
Bed: [id number] Expected date:  Expected time:  Means of arrival:  Comments: Fall, face injury

## 2017-11-23 LAB — URINE CULTURE: Culture: 80000 — AB

## 2017-11-24 ENCOUNTER — Telehealth: Payer: Self-pay

## 2017-11-24 NOTE — Telephone Encounter (Signed)
Post ED Visit - Positive Culture Follow-up  Culture report reviewed by antimicrobial stewardship pharmacist:  []  Enzo BiNathan Batchelder, Pharm.D. []  Celedonio MiyamotoJeremy Frens, [address]Ph[address] BCPS AQ-ID []  Garvin FilaMike Maccia, Pharm.D., BCPS []  Georgina PillionElizabeth Martin, Pharm.D., BCPS []  BryanMinh Pham, [address] BCPS, AAHIVP []  Estella HuskMichelle Turner, Pharm.D., BCPS, AAHIVP []  Lysle Pearlachel Rumbarger, PharmD, BCPS []  Phillips Climeshuy Dang, PharmD, BCPS [x]  Agapito GamesAlison Masters, PharmD, BCPS []  Verlan FriendsErin Deja, PharmD  Positive urine culture Treated with Cephalexin, organism sensitive to the same and no further patient follow-up is required at this time.  Jerry CarasCullom, [place] [employer] [place] 11/24/2017, 1:49 PM

## 2017-12-19 ENCOUNTER — Encounter (HOSPITAL_COMMUNITY): Payer: Self-pay | Admitting: Emergency Medicine

## 2017-12-19 ENCOUNTER — Emergency Department (HOSPITAL_COMMUNITY): Payer: Medicare Other

## 2017-12-19 ENCOUNTER — Other Ambulatory Visit: Payer: Self-pay

## 2017-12-19 ENCOUNTER — Emergency Department (HOSPITAL_COMMUNITY)
Admission: EM | Admit: 2017-12-19 | Discharge: 2017-12-19 | Disposition: A | Discharge status: Home / Self Care | Payer: Medicare Other | Attending: Emergency Medicine | Admitting: Emergency Medicine

## 2017-12-19 DIAGNOSIS — W19XXXA Unspecified fall, initial encounter: Secondary | ICD-10-CM | POA: Diagnosis not present

## 2017-12-19 DIAGNOSIS — Z7982 Long term (current) use of aspirin: Secondary | ICD-10-CM | POA: Diagnosis not present

## 2017-12-19 DIAGNOSIS — N390 Urinary tract infection, site not specified: Secondary | ICD-10-CM | POA: Insufficient documentation

## 2017-12-19 DIAGNOSIS — Z79899 Other long term (current) drug therapy: Secondary | ICD-10-CM | POA: Insufficient documentation

## 2017-12-19 DIAGNOSIS — F0390 Unspecified dementia without behavioral disturbance: Secondary | ICD-10-CM | POA: Insufficient documentation

## 2017-12-19 DIAGNOSIS — Y92129 Unspecified place in nursing home as the place of occurrence of the external cause: Secondary | ICD-10-CM | POA: Diagnosis not present

## 2017-12-19 DIAGNOSIS — S0990XA Unspecified injury of head, initial encounter: Secondary | ICD-10-CM | POA: Diagnosis present

## 2017-12-19 DIAGNOSIS — I10 Essential (primary) hypertension: Secondary | ICD-10-CM | POA: Insufficient documentation

## 2017-12-19 DIAGNOSIS — Y999 Unspecified external cause status: Secondary | ICD-10-CM | POA: Diagnosis not present

## 2017-12-19 DIAGNOSIS — S0003XA Contusion of scalp, initial encounter: Secondary | ICD-10-CM | POA: Insufficient documentation

## 2017-12-19 DIAGNOSIS — Y939 Activity, unspecified: Secondary | ICD-10-CM | POA: Insufficient documentation

## 2017-12-19 DIAGNOSIS — Z87891 Personal history of nicotine dependence: Secondary | ICD-10-CM | POA: Insufficient documentation

## 2017-12-19 LAB — URINALYSIS, ROUTINE W REFLEX MICROSCOPIC
BILIRUBIN URINE: NEGATIVE
Glucose, UA: NEGATIVE mg/dL
Hgb urine dipstick: NEGATIVE
Ketones, ur: NEGATIVE mg/dL
NITRITE: NEGATIVE
Protein, ur: NEGATIVE mg/dL
SPECIFIC GRAVITY, URINE: 1.01 (ref 1.005–1.030)
pH: 6 (ref 5.0–8.0)

## 2017-12-19 LAB — COMPREHENSIVE METABOLIC PANEL
ALBUMIN: 4 g/dL (ref 3.5–5.0)
ALT: 14 U/L (ref 0–44)
ANION GAP: 7 (ref 5–15)
AST: 26 U/L (ref 15–41)
Alkaline Phosphatase: 56 U/L (ref 38–126)
BUN: 20 mg/dL (ref 8–23)
CALCIUM: 9.4 mg/dL (ref 8.9–10.3)
CHLORIDE: 108 mmol/L (ref 98–111)
CO2: 27 mmol/L (ref 22–32)
Creatinine, Ser: 0.73 mg/dL (ref 0.44–1.00)
GFR calc non Af Amer: >60 mL/min (ref >60)
Glucose, Bld: 87 mg/dL (ref 70–99)
POTASSIUM: 4.2 mmol/L (ref 3.5–5.1)
SODIUM: 142 mmol/L (ref 135–145)
Total Bilirubin: 0.8 mg/dL (ref 0.3–1.2)
Total Protein: 7.3 g/dL (ref 6.5–8.1)

## 2017-12-19 LAB — CBC WITH DIFFERENTIAL/PLATELET
Abs Immature Granulocytes: 0.01 10*3/uL (ref 0.00–0.07)
BASOS ABS: 0 10*3/uL (ref 0.0–0.1)
BASOS PCT: 0 %
EOS PCT: 1 %
Eosinophils Absolute: 0.1 10*3/uL (ref 0.0–0.5)
HCT: 33.8 % — ABNORMAL LOW (ref 36.0–46.0)
Hemoglobin: 10.5 g/dL — ABNORMAL LOW (ref 12.0–15.0)
Immature Granulocytes: 0 %
Lymphocytes Relative: 19 %
Lymphs Abs: 1.1 10*3/uL (ref 0.7–4.0)
MCH: 27.1 pg (ref 26.0–34.0)
MCHC: 31.1 g/dL (ref 30.0–36.0)
MCV: 87.1 fL (ref 80.0–100.0)
MONO ABS: 0.5 10*3/uL (ref 0.1–1.0)
Monocytes Relative: 8 %
NEUTROS ABS: 4.3 10*3/uL (ref 1.7–7.7)
Neutrophils Relative %: 72 %
PLATELETS: 163 10*3/uL (ref 150–400)
RBC: 3.88 MIL/uL (ref 3.87–5.11)
RDW: 15.3 % (ref 11.5–15.5)
WBC: 6 10*3/uL (ref 4.0–10.5)
nRBC: 0 % (ref 0.0–0.2)

## 2017-12-19 LAB — CK: CK TOTAL: 222 U/L (ref 38–234)

## 2017-12-19 MED ORDER — CEPHALEXIN 500 MG PO CAPS: 500.0000 mg | ORAL_CAPSULE | Freq: Two times a day (BID) | ORAL | 0 refills | Status: AC

## 2017-12-19 MED ORDER — LISINOPRIL 10 MG PO TABS
10.0000 mg | ORAL_TABLET | Freq: Once | ORAL | Status: DC
  Filled 2017-12-19: qty 1

## 2017-12-19 MED ORDER — RISPERIDONE 0.5 MG PO TABS
0.2500 mg | ORAL_TABLET | Freq: Once | ORAL | Status: AC
  Administered 2017-12-19: 0.25 mg via ORAL
  Filled 2017-12-19: qty 1

## 2017-12-19 NOTE — ED Notes (Signed)
Pt will not sit still for xray

## 2017-12-19 NOTE — ED Provider Notes (Signed)
[facility]-EMERGENCY DEPT Provider Note   CSN: [id number] Arrival date & time: 12/19/17  5284     History   Chief Complaint Chief Complaint  Patient presents with  . Fall    HPI Marissa Chung is a 84 y.o. female.  HPI   84yo female with history of dementia presents from [facility] after an unwitnessed fall.  Patient with no concerns, trying to get out of bed in the ED. Denies pain.  History limited by dementia.  Staff reports that she is at her baseline, however noted to have some changes in behavior over the last few days, will place herself on the floor and scoot around, seems less steady. No fevers, no black or bloody stools, no diarrhea or vomiting, no coughing, no focal neurologic concerns.  Was found on the floor this AM, not sure how long she was there, last seen last night before bedtime.    Past Medical History:  Diagnosis Date  . Cataract    BILATERAL  . Frontal lobe dementia (HCC)   . History of shingles   . Hypertension     Patient Active Problem List   Diagnosis Date Noted  . Altered mental status 09/13/2017  . Palliative care by specialist   . Goals of care, counseling/discussion   . Encephalopathy 05/12/2017  . Syncope 03/10/2016  . Acute encephalopathy 08/27/2013  . Confusion 08/27/2013  . Accelerated hypertension 07/26/2013  . Frontotemporal dementia (HCC) 07/26/2013  . Anemia 07/26/2013  . Dyslipidemia 07/26/2013    Past Surgical History:  Procedure Laterality Date  . FINGER SURGERY Right    pinky finger     OB History   No obstetric history on file.      Home Medications    Prior to Admission medications   Medication Sig Start Date End Date Taking? Authorizing Provider  acetaminophen (TYLENOL) 650 MG CR tablet Take 650 mg by mouth daily.   Yes [provider]  Chloroxylenol-Zinc Oxide (BAZA EX) Apply 1 application topically See admin instructions. Apply to skin 3x/day as needed. Apply to sacral  area with each incontinence episode.   Yes [provider]  docusate sodium (COLACE) 100 MG capsule Take 100 mg by mouth daily as needed for mild constipation.   Yes [provider]  ferrous sulfate 325 (65 FE) MG tablet Take 325 mg by mouth daily with breakfast.   Yes [provider]  lactulose (CHRONULAC) 10 GM/15ML solution Take 20 g by mouth 2 (two) times daily.   Yes [provider]  levothyroxine (SYNTHROID, LEVOTHROID) 25 MCG tablet Take 25 mcg by mouth daily before breakfast.   Yes [provider]  lisinopril (PRINIVIL,ZESTRIL) 20 MG tablet Take 20 mg by mouth daily.   Yes [provider]  OLANZapine zydis (ZYPREXA) 5 MG disintegrating tablet Take 5 mg by mouth at bedtime.   Yes [provider]  Polyethyl Glycol-Propyl Glycol (SYSTANE) 0.4-0.3 % SOLN Place 1 drop into both eyes 3 (three) times daily.   Yes [provider]  rivastigmine (EXELON) 9.5 mg/24hr Place 9.5 mg onto the skin daily.   Yes [provider]  Vitamin D, Ergocalciferol, (DRISDOL) 50000 units CAPS capsule Take 50,000 Units by mouth every 7 (seven) days.   Yes [provider]  aspirin EC 81 MG EC tablet Take 1 tablet (81 mg total) by mouth daily. Patient not taking: Reported on 11/22/2017 05/19/17   Narda Bonds, MD  cephALEXin (KEFLEX) 500 MG capsule Take 1 capsule (  500 mg total) by mouth 2 (two) times daily. Patient not taking: Reported on 12/19/2017 11/22/17   Jacinto Halim, PA-C  megestrol (MEGACE) 400 MG/10ML suspension Take 10 mLs (400 mg total) by mouth daily. Patient not taking: Reported on 07/15/2017 05/19/17   Narda Bonds, MD  risperiDONE (RISPERDAL) 0.5 MG tablet Take 1 tablet (0.5 mg total) by mouth at bedtime. Patient not taking: Reported on 07/15/2017 05/18/17   Narda Bonds, MD    Family History Family History  Family history unknown: Yes    Social History Social History   Tobacco Use  . Smoking  status: Former Games developer  . Smokeless tobacco: Never Used  . Tobacco comment: quit smoking over 20 years ago   Substance Use Topics  . Alcohol use: No  . Drug use: No     Allergies   Shellfish-derived products   Review of Systems Review of Systems  Unable to perform ROS: Dementia  Constitutional: Negative for fever.  Respiratory: Negative for cough.   Cardiovascular: Negative for chest pain.  Gastrointestinal: Negative for blood in stool, diarrhea, nausea and vomiting.     Physical Exam Updated Vital Signs BP (!) 168/137 (BP Location: Right Arm)   Pulse 65   Temp (!) 97.4 F (36.3 C) (Oral)   Resp 12   SpO2 96%   Physical Exam Vitals signs and nursing note reviewed.  Constitutional:      General: She is not in acute distress.    Appearance: She is well-developed. She is not diaphoretic.  HENT:     Head: Normocephalic.     Comments: Hematoma right head Hematoma versus chronic findings middle of forehead Eyes:     Conjunctiva/sclera: Conjunctivae normal.  Neck:     Musculoskeletal: Normal range of motion.  Cardiovascular:     Rate and Rhythm: Normal rate and regular rhythm.     Heart sounds: Normal heart sounds. No murmur. No friction rub. No gallop.   Pulmonary:     Effort: Pulmonary effort is normal. No respiratory distress.     Breath sounds: Normal breath sounds. No wheezing or rales.  Abdominal:     General: There is no distension.     Palpations: Abdomen is soft.     Tenderness: There is no abdominal tenderness. There is no guarding.  Musculoskeletal:        General: No tenderness.  Skin:    General: Skin is warm and dry.     Findings: No erythema or rash.  Neurological:     Mental Status: She is alert.     Comments: Oriented to self Intermittently able to understand and follow directions Normal bilateral upper and lower ext strength, symmetric facies, normal witnessed EOM however,       ED Treatments / Results  Labs (all labs ordered are  listed, but only abnormal results are displayed) Labs Reviewed  CBC WITH DIFFERENTIAL/PLATELET - Abnormal; Notable for the following components:      Result Value   Hemoglobin 10.5 (*)    HCT 33.8 (*)    All other components within normal limits  URINALYSIS, ROUTINE W REFLEX MICROSCOPIC - Abnormal; Notable for the following components:   Leukocytes, UA LARGE (*)    Bacteria, UA RARE (*)    All other components within normal limits  URINE CULTURE  COMPREHENSIVE METABOLIC PANEL  CK    EKG None  Radiology Dg Knee 2 Views Left  Result Date: 12/19/2017 CLINICAL DATA:  Recent fall with knee bruising, initial  encounter EXAM: LEFT KNEE - 2 VIEW COMPARISON:  None. FINDINGS: Mild medial degenerative changes are noted. Some at these of fights are noted arising from the superior aspect of the patella. No acute fracture or dislocation is noted. No soft tissue changes are seen. IMPRESSION: Mild degenerative changes without acute abnormality. Electronically Signed   By: Alcide CleverMark  Lukens M.D.   On: 12/19/2017 12:51   Dg Knee 2 Views Right  Result Date: 12/19/2017 CLINICAL DATA:  Recent fall with bilateral knee bruising EXAM: RIGHT KNEE - 1-2 VIEW COMPARISON:  None. FINDINGS: Mild degenerative changes are noted most marked in the medial joint space but to a lesser degree in the patellofemoral space. No acute fracture or dislocation is seen. No soft tissue abnormality is noted. IMPRESSION: Degenerative change without acute abnormality. Electronically Signed   By: Alcide CleverMark  Lukens M.D.   On: 12/19/2017 12:52   Ct Head Wo Contrast  Result Date: 12/19/2017 CLINICAL DATA:  Fall.  Dementia forehead swelling EXAM: CT HEAD WITHOUT CONTRAST CT CERVICAL SPINE WITHOUT CONTRAST TECHNIQUE: Multidetector CT imaging of the head and cervical spine was performed following the standard protocol without intravenous contrast. Multiplanar CT image reconstructions of the cervical spine were also generated. COMPARISON:  01/21/2017  FINDINGS: CT HEAD FINDINGS Brain: The brain does not show accelerated atrophic changes. There are mild chronic small-vessel ischemic changes of the deep white matter. No sign of acute infarction, mass lesion, hemorrhage, hydrocephalus or extra-axial collection. Vascular: There is atherosclerotic calcification of the major vessels at the base of the brain. Skull: No skull fracture. Sinuses/Orbits: Clear/normal Other: Right-sided forehead scalp hematoma. CT CERVICAL SPINE FINDINGS Alignment: No traumatic malalignment. The head is turned towards the left, resulting in expected rotatory relationship of C1 and C2. If the patient can not easily rotate the head from right to left, rotary subluxation is not excluded. Skull base and vertebrae: Negative Soft tissues and spinal canal: Negative Disc levels: Ordinary mild spondylosis C5-6 and C6-7. No bony canal or foraminal stenosis. No significant facet arthropathy. Upper chest: Negative except for mild scarring. Other: None IMPRESSION: Head CT: Forehead scalp hematoma. No intracranial injury or acute intracranial finding. Cervical spine CT: No evidence of fracture. Rotatory positioning of C1 relative to C2 presumed secondary to head positioning for the scan. If the patient can not easily turn the head from right to left, rotatory subluxation of C1-2 is not excluded, but not favored based on what we have. Electronically Signed   By: Paulina FusiMark  Shogry M.D.   On: 12/19/2017 08:41   Ct Cervical Spine Wo Contrast  Result Date: 12/19/2017 CLINICAL DATA:  Fall.  Dementia forehead swelling EXAM: CT HEAD WITHOUT CONTRAST CT CERVICAL SPINE WITHOUT CONTRAST TECHNIQUE: Multidetector CT imaging of the head and cervical spine was performed following the standard protocol without intravenous contrast. Multiplanar CT image reconstructions of the cervical spine were also generated. COMPARISON:  01/21/2017 FINDINGS: CT HEAD FINDINGS Brain: The brain does not show accelerated atrophic changes.  There are mild chronic small-vessel ischemic changes of the deep white matter. No sign of acute infarction, mass lesion, hemorrhage, hydrocephalus or extra-axial collection. Vascular: There is atherosclerotic calcification of the major vessels at the base of the brain. Skull: No skull fracture. Sinuses/Orbits: Clear/normal Other: Right-sided forehead scalp hematoma. CT CERVICAL SPINE FINDINGS Alignment: No traumatic malalignment. The head is turned towards the left, resulting in expected rotatory relationship of C1 and C2. If the patient can not easily rotate the head from right to left, rotary subluxation is not excluded.  Skull base and vertebrae: Negative Soft tissues and spinal canal: Negative Disc levels: Ordinary mild spondylosis C5-6 and C6-7. No bony canal or foraminal stenosis. No significant facet arthropathy. Upper chest: Negative except for mild scarring. Other: None IMPRESSION: Head CT: Forehead scalp hematoma. No intracranial injury or acute intracranial finding. Cervical spine CT: No evidence of fracture. Rotatory positioning of C1 relative to C2 presumed secondary to head positioning for the scan. If the patient can not easily turn the head from right to left, rotatory subluxation of C1-2 is not excluded, but not favored based on what we have. Electronically Signed   By: Paulina Fusi M.D.   On: 12/19/2017 08:41    Procedures Procedures (including critical care time)  Medications Ordered in ED Medications  lisinopril (PRINIVIL,ZESTRIL) tablet 10 mg (10 mg Oral Refused 12/19/17 0859)  risperiDONE (RISPERDAL) tablet 0.25 mg (0.25 mg Oral Given 12/19/17 0905)     Initial Impression / Assessment and Plan / ED Course  I have reviewed the triage vital signs and the nursing notes.  Pertinent labs & imaging results that were available during my care of the patient were reviewed by me and considered in my medical decision making (see chart for details).      84yo female with history of  dementia presents from [facility] after an unwitnessed fall.  Facility reports she had been placing herself on the floor and scooting around more which is new. Given behavior change, unknown time on floor, sent labs including CK for furhter evaluation. Labs without abnormalities.  Patient htn and refusing medications. No signs of hypertensive emergency on hx of exam. CT shows no acute findings of head and neck.  Family reports concerns regarding contusions on legs to nursing> XR of knees shows no acute abnormalities.  Family no longer at bedside on my reevaluation, attempted to call niece but no answer.  Family was concerned contusion looked like hand print, I think given her history of falls it is probable they are from falls, but would encourage family to let the facility, APS know if they have concerns.  At this time given hx I have and exam I do not feel she requires holding for placement in other location.   UA concerning for UTI. Will treat with keflex. Patient discharged in stable condition with understanding of reasons to return.    Final Clinical Impressions(s) / ED Diagnoses   Final diagnoses:  Fall, initial encounter  Contusion of scalp, initial encounter    ED Discharge Orders    None       Alvira Monday, MD 12/19/17 1302

## 2017-12-19 NOTE — ED Notes (Signed)
[facility] called  

## 2017-12-19 NOTE — ED Triage Notes (Signed)
Pt was seen last night, had an unwitnessed fall was found this morning by staff. 3 hematomas on her forehead. No blood thinners. Staff said pt is at baseline (has dementia)

## 2017-12-19 NOTE — ED Notes (Signed)
PTAR called Niece Burna MortimerWanda [phone] attempted to call

## 2017-12-19 NOTE — ED Notes (Signed)
Bed: WA01 Expected date:  Expected time:  Means of arrival:  Comments: 84 yr old fall, forehead contusion

## 2017-12-19 NOTE — ED Notes (Signed)
Family at bedside. 

## 2017-12-20 ENCOUNTER — Encounter (HOSPITAL_COMMUNITY): Payer: Self-pay | Admitting: Emergency Medicine

## 2017-12-20 ENCOUNTER — Emergency Department (HOSPITAL_COMMUNITY): Payer: Medicare Other

## 2017-12-20 ENCOUNTER — Emergency Department (HOSPITAL_COMMUNITY)
Admission: EM | Admit: 2017-12-20 | Discharge: 2017-12-20 | Disposition: A | Discharge status: Home / Self Care | Payer: Medicare Other | Attending: Emergency Medicine | Admitting: Emergency Medicine

## 2017-12-20 DIAGNOSIS — Y92122 Bedroom in nursing home as the place of occurrence of the external cause: Secondary | ICD-10-CM | POA: Diagnosis not present

## 2017-12-20 DIAGNOSIS — W19XXXA Unspecified fall, initial encounter: Secondary | ICD-10-CM

## 2017-12-20 DIAGNOSIS — S0003XD Contusion of scalp, subsequent encounter: Secondary | ICD-10-CM | POA: Insufficient documentation

## 2017-12-20 DIAGNOSIS — Z7982 Long term (current) use of aspirin: Secondary | ICD-10-CM | POA: Diagnosis not present

## 2017-12-20 DIAGNOSIS — Y999 Unspecified external cause status: Secondary | ICD-10-CM | POA: Diagnosis not present

## 2017-12-20 DIAGNOSIS — Y9389 Activity, other specified: Secondary | ICD-10-CM | POA: Insufficient documentation

## 2017-12-20 DIAGNOSIS — S50312A Abrasion of left elbow, initial encounter: Secondary | ICD-10-CM | POA: Diagnosis not present

## 2017-12-20 DIAGNOSIS — W01198A Fall on same level from slipping, tripping and stumbling with subsequent striking against other object, initial encounter: Secondary | ICD-10-CM | POA: Insufficient documentation

## 2017-12-20 DIAGNOSIS — G3109 Other frontotemporal dementia: Secondary | ICD-10-CM | POA: Diagnosis not present

## 2017-12-20 DIAGNOSIS — Z87891 Personal history of nicotine dependence: Secondary | ICD-10-CM | POA: Diagnosis not present

## 2017-12-20 DIAGNOSIS — Z79899 Other long term (current) drug therapy: Secondary | ICD-10-CM | POA: Diagnosis not present

## 2017-12-20 DIAGNOSIS — S59902A Unspecified injury of left elbow, initial encounter: Secondary | ICD-10-CM | POA: Diagnosis present

## 2017-12-20 DIAGNOSIS — I10 Essential (primary) hypertension: Secondary | ICD-10-CM | POA: Diagnosis not present

## 2017-12-20 MED ORDER — ACETAMINOPHEN 325 MG PO TABS
650.0000 mg | ORAL_TABLET | Freq: Once | ORAL | Status: AC
  Administered 2017-12-20: 650 mg via ORAL
  Filled 2017-12-20: qty 2

## 2017-12-20 NOTE — ED Notes (Signed)
Family at bedside. 

## 2017-12-20 NOTE — ED Provider Notes (Signed)
[facility]-EMERGENCY DEPT Provider Note   CSN: [id number] Arrival date & time: 12/20/17  1136     History   Chief Complaint Chief Complaint  Patient presents with  . Fall    HPI Marissa Chung is a 84 y.o. female history of dementia, hypertension here presenting with fall.  This is a recurrent problem and patient is in the assisted living currently.  Patient was seen in the ED yesterday after a fall and was noted to have frontal hematoma and had a CT head and neck was unremarkable.  Patient was diagnosed with UTI and sent back to the facility with Keflex. Patient apparently had another unwitnessed fall this morning.  She was noted to have a right hip hematoma versus dislocation.  Also there was noted to have left elbow skin tear.  It was no reported head injury today.   The history is provided by the patient.    Past Medical History:  Diagnosis Date  . Cataract    BILATERAL  . Frontal lobe dementia (HCC)   . History of shingles   . Hypertension     Patient Active Problem List   Diagnosis Date Noted  . Altered mental status 09/13/2017  . Palliative care by specialist   . Goals of care, counseling/discussion   . Encephalopathy 05/12/2017  . Syncope 03/10/2016  . Acute encephalopathy 08/27/2013  . Confusion 08/27/2013  . Accelerated hypertension 07/26/2013  . Frontotemporal dementia (HCC) 07/26/2013  . Anemia 07/26/2013  . Dyslipidemia 07/26/2013    Past Surgical History:  Procedure Laterality Date  . FINGER SURGERY Right    pinky finger     OB History   No obstetric history on file.      Home Medications    Prior to Admission medications   Medication Sig Start Date End Date Taking? Authorizing Provider  acetaminophen (TYLENOL) 650 MG CR tablet Take 650 mg by mouth daily.   Yes [provider]  cephALEXin (KEFLEX) 500 MG capsule Take 1 capsule (500 mg total) by mouth 2 (two) times daily for 7 days. 12/19/17 12/26/17 Yes  Alvira MondaySchlossman, Erin, MD  Chloroxylenol-Zinc Oxide (BAZA EX) Apply 1 application topically See admin instructions. Apply to skin 3x/day as needed. Apply to sacral area with each incontinence episode.   Yes [provider]  docusate sodium (COLACE) 100 MG capsule Take 100 mg by mouth daily as needed for mild constipation.   Yes [provider]  ferrous sulfate 325 (65 FE) MG tablet Take 325 mg by mouth daily with breakfast.   Yes [provider]  lactulose (CHRONULAC) 10 GM/15ML solution Take 20 g by mouth 2 (two) times daily.   Yes [provider]  levothyroxine (SYNTHROID, LEVOTHROID) 25 MCG tablet Take 25 mcg by mouth daily before breakfast.   Yes [provider]  lisinopril (PRINIVIL,ZESTRIL) 20 MG tablet Take 20 mg by mouth daily.   Yes [provider]  OLANZapine zydis (ZYPREXA) 5 MG disintegrating tablet Take 5 mg by mouth at bedtime.   Yes [provider]  Polyethyl Glycol-Propyl Glycol (SYSTANE) 0.4-0.3 % SOLN Place 1 drop into both eyes 3 (three) times daily.   Yes [provider]  rivastigmine (EXELON) 9.5 mg/24hr Place 9.5 mg onto the skin daily.   Yes [provider]  Vitamin D, Ergocalciferol, (DRISDOL) 50000 units CAPS capsule Take 50,000 Units by mouth every 7 (seven) days.   Yes [provider]  aspirin EC 81 MG EC tablet Take 1 tablet (81  mg total) by mouth daily. Patient not taking: Reported on 11/22/2017 05/19/17   Narda Bonds, MD  megestrol (MEGACE) 400 MG/10ML suspension Take 10 mLs (400 mg total) by mouth daily. Patient not taking: Reported on 07/15/2017 05/19/17   Narda Bonds, MD  risperiDONE (RISPERDAL) 0.5 MG tablet Take 1 tablet (0.5 mg total) by mouth at bedtime. Patient not taking: Reported on 07/15/2017 05/18/17   Narda Bonds, MD    Family History Family History  Family history unknown: Yes    Social History Social History   Tobacco Use  . Smoking status: Former Games developer   . Smokeless tobacco: Never Used  . Tobacco comment: quit smoking over 20 years ago   Substance Use Topics  . Alcohol use: No  . Drug use: No     Allergies   Shellfish-derived products   Review of Systems Review of Systems  Musculoskeletal:       L hip pain   All other systems reviewed and are negative.    Physical Exam Updated Vital Signs BP (!) 174/85 (BP Location: Right Arm)   Pulse 74   Temp 98.2 F (36.8 C) (Oral)   Resp 16   SpO2 99%   Physical Exam Vitals signs and nursing note reviewed.  Constitutional:      Comments: Chronically ill, demented   HENT:     Head:     Comments: R frontal scalp hematoma    Nose: Nose normal.     Mouth/Throat:     Mouth: Mucous membranes are moist.  Eyes:     Extraocular Movements: Extraocular movements intact.     Pupils: Pupils are equal, round, and reactive to light.  Neck:     Musculoskeletal: Normal range of motion.     Comments: No midline tenderness  Cardiovascular:     Rate and Rhythm: Normal rate and regular rhythm.  Pulmonary:     Effort: Pulmonary effort is normal.     Breath sounds: Normal breath sounds.  Abdominal:     General: Abdomen is flat.     Palpations: Abdomen is soft.  Musculoskeletal:     Comments: Abrasion L elbow. ? Hematoma R hip. No midline spinal tenderness   Skin:    General: Skin is warm.  Neurological:     General: No focal deficit present.     Mental Status: She is oriented to person, place, and time.  Psychiatric:        Mood and Affect: Mood normal.      ED Treatments / Results  Labs (all labs ordered are listed, but only abnormal results are displayed) Labs Reviewed - No data to display  EKG None  Radiology Dg Knee 2 Views Left  Result Date: 12/19/2017 CLINICAL DATA:  Recent fall with knee bruising, initial encounter EXAM: LEFT KNEE - 2 VIEW COMPARISON:  None. FINDINGS: Mild medial degenerative changes are noted. Some at these of fights are noted arising from the  superior aspect of the patella. No acute fracture or dislocation is noted. No soft tissue changes are seen. IMPRESSION: Mild degenerative changes without acute abnormality. Electronically Signed   By: Alcide Clever M.D.   On: 12/19/2017 12:51   Dg Knee 2 Views Right  Result Date: 12/19/2017 CLINICAL DATA:  Recent fall with bilateral knee bruising EXAM: RIGHT KNEE - 1-2 VIEW COMPARISON:  None. FINDINGS: Mild degenerative changes are noted most marked in the medial joint space but to a lesser degree in the patellofemoral space. No acute  fracture or dislocation is seen. No soft tissue abnormality is noted. IMPRESSION: Degenerative change without acute abnormality. Electronically Signed   By: Alcide CleverMark  Lukens M.D.   On: 12/19/2017 12:52   Ct Head Wo Contrast  Result Date: 12/19/2017 CLINICAL DATA:  Fall.  Dementia forehead swelling EXAM: CT HEAD WITHOUT CONTRAST CT CERVICAL SPINE WITHOUT CONTRAST TECHNIQUE: Multidetector CT imaging of the head and cervical spine was performed following the standard protocol without intravenous contrast. Multiplanar CT image reconstructions of the cervical spine were also generated. COMPARISON:  01/21/2017 FINDINGS: CT HEAD FINDINGS Brain: The brain does not show accelerated atrophic changes. There are mild chronic small-vessel ischemic changes of the deep white matter. No sign of acute infarction, mass lesion, hemorrhage, hydrocephalus or extra-axial collection. Vascular: There is atherosclerotic calcification of the major vessels at the base of the brain. Skull: No skull fracture. Sinuses/Orbits: Clear/normal Other: Right-sided forehead scalp hematoma. CT CERVICAL SPINE FINDINGS Alignment: No traumatic malalignment. The head is turned towards the left, resulting in expected rotatory relationship of C1 and C2. If the patient can not easily rotate the head from right to left, rotary subluxation is not excluded. Skull base and vertebrae: Negative Soft tissues and spinal canal:  Negative Disc levels: Ordinary mild spondylosis C5-6 and C6-7. No bony canal or foraminal stenosis. No significant facet arthropathy. Upper chest: Negative except for mild scarring. Other: None IMPRESSION: Head CT: Forehead scalp hematoma. No intracranial injury or acute intracranial finding. Cervical spine CT: No evidence of fracture. Rotatory positioning of C1 relative to C2 presumed secondary to head positioning for the scan. If the patient can not easily turn the head from right to left, rotatory subluxation of C1-2 is not excluded, but not favored based on what we have. Electronically Signed   By: Paulina FusiMark  Shogry M.D.   On: 12/19/2017 08:41   Ct Cervical Spine Wo Contrast  Result Date: 12/19/2017 CLINICAL DATA:  Fall.  Dementia forehead swelling EXAM: CT HEAD WITHOUT CONTRAST CT CERVICAL SPINE WITHOUT CONTRAST TECHNIQUE: Multidetector CT imaging of the head and cervical spine was performed following the standard protocol without intravenous contrast. Multiplanar CT image reconstructions of the cervical spine were also generated. COMPARISON:  01/21/2017 FINDINGS: CT HEAD FINDINGS Brain: The brain does not show accelerated atrophic changes. There are mild chronic small-vessel ischemic changes of the deep white matter. No sign of acute infarction, mass lesion, hemorrhage, hydrocephalus or extra-axial collection. Vascular: There is atherosclerotic calcification of the major vessels at the base of the brain. Skull: No skull fracture. Sinuses/Orbits: Clear/normal Other: Right-sided forehead scalp hematoma. CT CERVICAL SPINE FINDINGS Alignment: No traumatic malalignment. The head is turned towards the left, resulting in expected rotatory relationship of C1 and C2. If the patient can not easily rotate the head from right to left, rotary subluxation is not excluded. Skull base and vertebrae: Negative Soft tissues and spinal canal: Negative Disc levels: Ordinary mild spondylosis C5-6 and C6-7. No bony canal or  foraminal stenosis. No significant facet arthropathy. Upper chest: Negative except for mild scarring. Other: None IMPRESSION: Head CT: Forehead scalp hematoma. No intracranial injury or acute intracranial finding. Cervical spine CT: No evidence of fracture. Rotatory positioning of C1 relative to C2 presumed secondary to head positioning for the scan. If the patient can not easily turn the head from right to left, rotatory subluxation of C1-2 is not excluded, but not favored based on what we have. Electronically Signed   By: Paulina FusiMark  Shogry M.D.   On: 12/19/2017 08:41    Procedures  Procedures (including critical care time)  Medications Ordered in ED Medications  acetaminophen (TYLENOL) tablet 650 mg (has no administration in time range)     Initial Impression / Assessment and Plan / ED Course  I have reviewed the triage vital signs and the nursing notes.  Pertinent labs & imaging results that were available during my care of the patient were reviewed by me and considered in my medical decision making (see chart for details).    Marissa Chung is a 84 y.o. female here with fall. Patient is from dementia unit but is an assisted living. Patient has recurrent falls and multiple ED visits for fall, most recent yesterday. Initially, no reported head injury but then family call and was unsure. Will repeat CT head/neck, xrays.   3:21 PM CT unremarkable. xrays showed no hip fracture, ? Occult L radial head fracture. On exam, she is moving the L elbow and there is only small abrasion with no obvious joint swelling. Will hold off on splint. Case management saw patient and recommend home health services and outpatient placement from facility.    Final Clinical Impressions(s) / ED Diagnoses   Final diagnoses:  None    ED Discharge Orders         Ordered    Home Health     12/20/17 1214    Face-to-face encounter (required for Medicare/Medicaid patients)    Comments:  I Richardean Canal certify that this  patient is under my care and that I, or a nurse practitioner or physician's assistant working with me, had a face-to-face encounter that meets the physician face-to-face encounter requirements with this patient on 12/20/2017. The encounter with the patient was in whole, or in part for the following medical condition(s) which is the primary reason for home health care (List medical condition): dementia. Further orders from PCP   12/20/17 1214           Charlynne Pander, MD 12/20/17 [phone]

## 2017-12-20 NOTE — Discharge Instructions (Addendum)
Take tylenol for pain.   Repeat left elbow xray in a week if she has pain or swelling.   Fall precautions.   Home health will contact you   See your doctor  Return to ER if you have another fall, vomiting, lethargy

## 2017-12-20 NOTE — ED Triage Notes (Signed)
Per EMS[facility], unwitnessed fall within last 40 minutes-patient slipped putting on pants-skin tear to left elbow and hematoma to left forehead-staff lifted patient from floor to bed prior to EMS arriving-no s/s's of pain-history of dementia, at baseline per staff-no blood thinners-was evaluated for fall yesterday(2 hematomas to right forehead)-staff states right hip looks "out of place"-no pain upon palpation

## 2017-12-20 NOTE — Care Management Note (Addendum)
Case Management Note  Patient Details  Name: Marissa Chung MRN: [id number] Date of Birth: 13-Sep-1933  CM noted [facility].  CM spoke with Dr[facility] Silverio LayYao [facility] PT OT SW.  He was surprised she was coming from ALF not SNF.  CM advised that was part of the reason [facility]; to evaluate appropriate LOC and facilitate any transition needed.  CM contacted [name] with [facility] who contracts with [facility].  He accepted pt for services and is aware of pt's needs.  CM was consulted again once pt's niece was at bedside.  CM and niece had a very long discussion about moving pt to a SNF.  She is already aware that is what the pt needs but there is a problem with financing.  She reports the pt gets about $1300 a month in disability and then another $2600 from a pension plan a month.  This disqualifies her for medicaid which will pay for LTC at a SNF but it is not enough to pay for a SNF.  CM suggested Ms. Joseph ArtWoods find out if they can cash the pension to use towards SNF payment which the pt could then transfer to medicaid if those funds were expended.  She reports she will work on finding that information out.  Also discussed PCS at length.  She is interested in finding a PCS agency that can stay with the pt overnight at the ALF so she doesn't continue to fall.  She will look further into an agency.  CM updated her that [facility] can also help with that.  We also discussed palliative and hospice care.    Pt's niece appeared very overwhelmed and blamed herself for not doing something sooner.  She said "I dropped the ball."  She was very thankful for the help and fel that she would be [facility] in the coming days.  No further CM needs noted at this time.  Expected Discharge Date:   12/20/2017               Expected Discharge Plan:  Home w Home Health Services  Discharge planning Services  CM Consult  Post Acute Care Choice:  Home Health Choice  offered to:  NA  HH Arranged:  RN, PT, OT, Social Work [facility]  Status of Service:  Completed, signed off  Lynk Marti, Lynnae Sandhoffngela N, RN 12/20/2017, 12:06 PM

## 2017-12-20 NOTE — ED Notes (Signed)
Introduced self to pt, pt with c/o headache.  Rn asked pt to point to where it hurt and pt pointed to front bilateral temples. When palpating hip, pt reported no pain.

## 2017-12-20 NOTE — ED Notes (Signed)
Notified [facility] of need for [facility].

## 2017-12-20 NOTE — ED Notes (Signed)
Bed: [facility] Expected date:  Expected time:  Means of arrival:  Comments: EMS-fall 

## 2017-12-21 LAB — URINE CULTURE

## 2017-12-24 ENCOUNTER — Emergency Department (HOSPITAL_COMMUNITY)
Admission: EM | Admit: 2017-12-24 | Discharge: 2017-12-24 | Disposition: A | Discharge status: Home / Self Care | Payer: Medicare Other | Attending: Emergency Medicine | Admitting: Emergency Medicine

## 2017-12-24 ENCOUNTER — Other Ambulatory Visit: Payer: Self-pay

## 2017-12-24 ENCOUNTER — Encounter (HOSPITAL_COMMUNITY): Payer: Self-pay | Admitting: Emergency Medicine

## 2017-12-24 DIAGNOSIS — Y92191 Dining room in other specified residential institution as the place of occurrence of the external cause: Secondary | ICD-10-CM | POA: Diagnosis not present

## 2017-12-24 DIAGNOSIS — Y939 Activity, unspecified: Secondary | ICD-10-CM | POA: Insufficient documentation

## 2017-12-24 DIAGNOSIS — W19XXXA Unspecified fall, initial encounter: Secondary | ICD-10-CM | POA: Diagnosis not present

## 2017-12-24 DIAGNOSIS — Y999 Unspecified external cause status: Secondary | ICD-10-CM | POA: Insufficient documentation

## 2017-12-24 DIAGNOSIS — S0083XA Contusion of other part of head, initial encounter: Secondary | ICD-10-CM | POA: Insufficient documentation

## 2017-12-24 DIAGNOSIS — Z79899 Other long term (current) drug therapy: Secondary | ICD-10-CM | POA: Insufficient documentation

## 2017-12-24 DIAGNOSIS — S0993XA Unspecified injury of face, initial encounter: Secondary | ICD-10-CM | POA: Diagnosis present

## 2017-12-24 DIAGNOSIS — F0390 Unspecified dementia without behavioral disturbance: Secondary | ICD-10-CM | POA: Insufficient documentation

## 2017-12-24 DIAGNOSIS — Z87891 Personal history of nicotine dependence: Secondary | ICD-10-CM | POA: Diagnosis not present

## 2017-12-24 DIAGNOSIS — I10 Essential (primary) hypertension: Secondary | ICD-10-CM | POA: Insufficient documentation

## 2017-12-24 NOTE — ED Notes (Addendum)
[facility] has been called to transport this [facility]. [facility] is en route.

## 2017-12-24 NOTE — ED Triage Notes (Signed)
Pt [facility]. Pt had witness a witnessed fall in the dinning room and hit her head. Pt isn't on blood thinners. Pt has no LOC.   EMS put patient in c-collar.  Pt has dementia.

## 2017-12-24 NOTE — ED Notes (Addendum)
Patient is unable to answer all triage questions due to being mentally altered w/ Dementia.

## 2017-12-24 NOTE — ED Provider Notes (Signed)
[facility]-EMERGENCY DEPT Provider Note   CSN: [id number] Arrival date & time: 12/24/17  7829     History   Chief Complaint Chief Complaint  Patient presents with  . Fall    HPI Marissa Chung is a 84 y.o. female.  HPI Patient is sent from nursing home facility with a witnessed fall.  Patient was in the dining room, she fell and hit her head.  No loss of consciousness.  Patient is not on anticoagulants.  Patient has severe dementia.  She interacts with me but response to questions are very unreliable. Past Medical History:  Diagnosis Date  . Cataract    BILATERAL  . Frontal lobe dementia (HCC)   . History of shingles   . Hypertension     Patient Active Problem List   Diagnosis Date Noted  . Altered mental status 06-Sep-202019  . Palliative care by specialist   . Goals of care, counseling/discussion   . Encephalopathy 05/12/2017  . Syncope 03/10/2016  . Acute encephalopathy 08/27/2013  . Confusion 08/27/2013  . Accelerated hypertension 07/26/2013  . Frontotemporal dementia (HCC) 07/26/2013  . Anemia 07/26/2013  . Dyslipidemia 07/26/2013    Past Surgical History:  Procedure Laterality Date  . FINGER SURGERY Right    pinky finger     OB History   No obstetric history on file.      Home Medications    Prior to Admission medications   Medication Sig Start Date End Date Taking? Authorizing Provider  acetaminophen (TYLENOL) 650 MG CR tablet Take 650 mg by mouth daily.   Yes [provider]  cephALEXin (KEFLEX) 500 MG capsule Take 1 capsule (500 mg total) by mouth 2 (two) times daily for 7 days. 12/19/17 12/26/17 Yes Alvira Monday, MD  docusate sodium (COLACE) 100 MG capsule Take 100 mg by mouth daily as needed for mild constipation.   Yes [provider]  ferrous sulfate 325 (65 FE) MG tablet Take 325 mg by mouth daily with breakfast.   Yes [provider]  lactulose (CHRONULAC) 10 GM/15ML solution Take 20 g  by mouth 2 (two) times daily.   Yes [provider]  levothyroxine (SYNTHROID, LEVOTHROID) 25 MCG tablet Take 25 mcg by mouth daily before breakfast.   Yes [provider]  lisinopril (PRINIVIL,ZESTRIL) 20 MG tablet Take 20 mg by mouth daily.   Yes [provider]  OLANZapine zydis (ZYPREXA) 5 MG disintegrating tablet Take 5 mg by mouth at bedtime.   Yes [provider]  Polyethyl Glycol-Propyl Glycol (SYSTANE) 0.4-0.3 % SOLN Place 1 drop into both eyes 3 (three) times daily.   Yes [provider]  rivastigmine (EXELON) 9.5 mg/24hr Place 9.5 mg onto the skin daily.   Yes [provider]  Vitamin D, Ergocalciferol, (DRISDOL) 50000 units CAPS capsule Take 50,000 Units by mouth every 7 (seven) days.   Yes [provider]  aspirin EC 81 MG EC tablet Take 1 tablet (81 mg total) by mouth daily. Patient not taking: Reported on 12/24/2017 05/19/17   Narda Bonds, MD  Chloroxylenol-Zinc Oxide Jeanie Cooks EX) Apply 1 application topically See admin instructions. Apply to skin 3x/day as needed. Apply to sacral area with each incontinence episode.    [provider]  megestrol (MEGACE) 400 MG/10ML suspension Take 10 mLs (400 mg total) by mouth daily. Patient not taking: Reported on 07/15/2017 05/19/17   Narda Bonds, MD  risperiDONE (RISPERDAL) 0.5 MG tablet Take 1 tablet (0.5 mg total) by mouth  at bedtime. Patient not taking: Reported on 12/24/2017 05/18/17   Narda BondsNettey, Ralph A, MD    Family History Family History  Family history unknown: Yes    Social History Social History   Tobacco Use  . Smoking status: Former Games developermoker  . Smokeless tobacco: Never Used  . Tobacco comment: quit smoking over 20 years ago   Substance Use Topics  . Alcohol use: No  . Drug use: No     Allergies   Shellfish-derived products   Review of Systems Review of Systems Level V caveat cannot obtain review of systems due to dementia.  Physical  Exam Updated Vital Signs BP (!) 150/136 (BP Location: Right Arm)   Pulse 73   Temp 97.7 F (36.5 C) (Oral)   Resp 16   SpO2 (!) 89%   Physical Exam Constitutional:      Comments: Patient is lying in the stretcher awake.  She has a cervical collar on.  She is looking around.  No respiratory distress.  HENT:     Head:     Comments: Patient has hematoma to the left forehead.  She has some purplish ecchymosis around the left zygoma.  This appears to be slightly older.  She has another hematoma to the right forehead.  It appears slightly older and has brownish pigmentation.  No open lacerations or fresh abrasions to the face.  No palpable hematomas in the scalp.  Patient's hair is shaved back to the crown.  Much of scalp is visible.    Nose: Nose normal.     Mouth/Throat:     Mouth: Mucous membranes are moist.  Eyes:     Extraocular Movements: Extraocular movements intact.  Neck:     Comments: I have extensively palpated the patient's cervical spine, base of the head and top of the back with absolutely no suggestion of pain.  Seems soothed and comforted by exam. Cardiovascular:     Rate and Rhythm: Normal rate and regular rhythm.     Pulses: Normal pulses.  Pulmonary:     Effort: Pulmonary effort is normal.     Breath sounds: Normal breath sounds.     Comments: I have compressed and palpated the thorax with no signs of pain or objection.  No crepitus or significant visual contusions or abrasions. Abdominal:     General: Abdomen is flat. There is no distension.     Tenderness: There is no abdominal tenderness. There is no guarding.  Musculoskeletal: Normal range of motion.     Comments: I have placed patients upper and lower extremities to passive range of motion.  This does not cause the patient pain or distress.  No appreciable deformities or effusions.  Skin:    General: Skin is warm and dry.  Neurological:     Comments: Patient is alert.  She does respond to me verbally.  Some of  the responses are not situationally appropriate.  She is calm and pleasant.  She seems very hard of hearing.  Psychiatric:        Mood and Affect: Mood normal.      ED Treatments / Results  Labs (all labs ordered are listed, but only abnormal results are displayed) Labs Reviewed - No data to display  EKG None  Radiology No results found.  Procedures Procedures (including critical care time)  Medications Ordered in ED Medications - No data to display   Initial Impression / Assessment and Plan / ED Course  I have reviewed the triage vital signs  and the nursing notes.  Pertinent labs & imaging results that were available during my care of the patient were reviewed by me and considered in my medical decision making (see chart for details).    Patient is alert.  She shows no signs of distress.  She has frequent falls.  She is not in anticoagulated.  She had no loss of consciousness with a witnessed fall.  Patient has had many CT scans.  At this time, clinically I do not see indication for repeat scan.  Both contusions on the face appear to be older.  No evidence on clinical exam to suggest she has facial fractures.  Patient is alert and in no distress.  Patient will be returned to nursing home care for observation and return if changes in baseline function.  Final Clinical Impressions(s) / ED Diagnoses   Final diagnoses:  Fall, initial encounter  Contusion of face, initial encounter    ED Discharge Orders    None       Arby BarrettePfeiffer, Bashir Marchetti, MD 12/24/17 1303

## 2017-12-24 NOTE — ED Notes (Signed)
[facility] Expected date:  Expected time:  Means of arrival:  Comments: EMS- fall/no injury/Hx of dementia

## 2017-12-24 NOTE — ED Notes (Addendum)
Patient is unable to sign discharge paper work due to mental status. No family members are present on the patient's behalf. [facility]

## 2017-12-24 NOTE — ED Notes (Signed)
Patient was picked up by [facility]  

## 2017-12-24 NOTE — Care Management Note (Signed)
Case Management Note  CM was contacted by Tommi with [facility] who advised [facility] Friday and when they went to evaluate the pt today, they discovered she was in the ED.  CM updated her about last Thursday's ED encounter and discussion with pt's niece.  CM contacted Denyse AmassCorey with [facility] who advised that the pt was opened on Fri and that PT also had a chance to evaluate the pt since her last ED visit.  PT does not feel that pt will rehab well.  They advised they will keep the pt open to assist with needs until pt is [facility].  CM contacted Tommi again to update.  She contacted the pt's niece Ms. Woods who requests they evaluate the pt tomorrow at 10 am instead of tonight.  Their intake nurse will contact [facility] after the assessment if pt meets hospice qualifications so [facility] can close the case and [facility] can open her.  Updated [name] with [facility].  No further CM needs noted at this time.  Ina Poupard, Lynnae SandhoffAngela N, RN 12/24/2017, 4:20 PM

## 2018-03-01 ENCOUNTER — Emergency Department (HOSPITAL_COMMUNITY)
Admission: EM | Admit: 2018-03-01 | Discharge: 2018-03-02 | Disposition: A | Discharge status: Home / Self Care | Payer: Medicare Other | Attending: Emergency Medicine | Admitting: Emergency Medicine

## 2018-03-01 DIAGNOSIS — Y9384 Activity, sleeping: Secondary | ICD-10-CM | POA: Diagnosis not present

## 2018-03-01 DIAGNOSIS — Y998 Other external cause status: Secondary | ICD-10-CM | POA: Diagnosis not present

## 2018-03-01 DIAGNOSIS — Y92122 Bedroom in nursing home as the place of occurrence of the external cause: Secondary | ICD-10-CM | POA: Diagnosis not present

## 2018-03-01 DIAGNOSIS — F0390 Unspecified dementia without behavioral disturbance: Secondary | ICD-10-CM | POA: Diagnosis not present

## 2018-03-01 DIAGNOSIS — Z87891 Personal history of nicotine dependence: Secondary | ICD-10-CM | POA: Insufficient documentation

## 2018-03-01 DIAGNOSIS — S0181XA Laceration without foreign body of other part of head, initial encounter: Secondary | ICD-10-CM | POA: Diagnosis not present

## 2018-03-01 DIAGNOSIS — I10 Essential (primary) hypertension: Secondary | ICD-10-CM | POA: Diagnosis not present

## 2018-03-01 DIAGNOSIS — S0083XA Contusion of other part of head, initial encounter: Secondary | ICD-10-CM | POA: Insufficient documentation

## 2018-03-01 DIAGNOSIS — Z79899 Other long term (current) drug therapy: Secondary | ICD-10-CM | POA: Diagnosis not present

## 2018-03-01 DIAGNOSIS — W19XXXA Unspecified fall, initial encounter: Secondary | ICD-10-CM

## 2018-03-01 DIAGNOSIS — W06XXXA Fall from bed, initial encounter: Secondary | ICD-10-CM | POA: Insufficient documentation

## 2018-03-01 DIAGNOSIS — S0990XA Unspecified injury of head, initial encounter: Secondary | ICD-10-CM | POA: Diagnosis present

## 2018-03-01 NOTE — ED Triage Notes (Signed)
Pt arrives EMS from [facility] s/p unwitnessed fall from bed. Pt found on floor. Hematoma above left eye and back left of head. Negative for blood thinners. Patient with hx dementia. Patient in c-collar per EMS precautions.

## 2018-03-01 NOTE — ED Notes (Signed)
Bed: WA20 Expected date:  Expected time:  Means of arrival:  Comments: 59 F fall

## 2018-03-02 ENCOUNTER — Emergency Department (HOSPITAL_COMMUNITY): Payer: Medicare Other

## 2018-03-02 ENCOUNTER — Other Ambulatory Visit: Payer: Self-pay

## 2018-03-02 ENCOUNTER — Encounter (HOSPITAL_COMMUNITY): Payer: Self-pay | Admitting: Emergency Medicine

## 2018-03-02 DIAGNOSIS — S0083XA Contusion of other part of head, initial encounter: Secondary | ICD-10-CM | POA: Diagnosis not present

## 2018-03-02 LAB — URINALYSIS, ROUTINE W REFLEX MICROSCOPIC
Bilirubin Urine: NEGATIVE
Glucose, UA: NEGATIVE mg/dL
Hgb urine dipstick: NEGATIVE
Ketones, ur: 5 mg/dL — AB
LEUKOCYTE UA: NEGATIVE
Nitrite: NEGATIVE
Protein, ur: NEGATIVE mg/dL
Specific Gravity, Urine: 1.014 (ref 1.005–1.030)
pH: 6 (ref 5.0–8.0)

## 2018-03-02 MED ORDER — FENTANYL CITRATE (PF) 100 MCG/2ML IJ SOLN
50.0000 ug | Freq: Once | INTRAMUSCULAR | Status: AC
  Administered 2018-03-02: 50 ug via INTRAMUSCULAR
  Filled 2018-03-02: qty 2

## 2018-03-02 NOTE — ED Provider Notes (Signed)
[facility]-EMERGENCY DEPT Provider Note  CSN: [id number] Arrival date & time: 03/01/18 2358  Chief Complaint(s) Fall  HPI Marissa Chung is a 85 y.o. female with a history of dementia who lives in a skilled nursing facility presents to the emergency department after an unwitnessed fall from bed.  Patient was found just prior to arrival next to her bed and noted to have contusion to the left forehead and blood in her occiput.  Patient complains of headache but denies any other physical complaints.  Remainder of history, ROS, and physical exam limited due to patient's condition (dementia). Additional information was obtained from EMS and family.   Level V Caveat.  Niece states that she frequently falls whenever she has a urinary tract infection.  Niece reports that the patient has been in hospice for 3 months.  HPI  Past Medical History Past Medical History:  Diagnosis Date  . Cataract    BILATERAL  . Frontal lobe dementia (HCC)   . History of shingles   . Hypertension    Patient Active Problem List   Diagnosis Date Noted  . Altered mental status 05/17/2017  . Palliative care by specialist   . Goals of care, counseling/discussion   . Encephalopathy 05/12/2017  . Syncope 03/10/2016  . Acute encephalopathy 08/27/2013  . Confusion 08/27/2013  . Accelerated hypertension 07/26/2013  . Frontotemporal dementia (HCC) 07/26/2013  . Anemia 07/26/2013  . Dyslipidemia 07/26/2013   Home Medication(s) Prior to Admission medications   Medication Sig Start Date End Date Taking? Authorizing Provider  acetaminophen (TYLENOL) 650 MG CR tablet Take 650 mg by mouth daily.    [provider]  aspirin EC 81 MG EC tablet Take 1 tablet (81 mg total) by mouth daily. Patient not taking: Reported on 12/24/2017 05/19/17   Narda Bonds, MD  Chloroxylenol-Zinc Oxide Jeanie Cooks EX) Apply 1 application topically See admin instructions. Apply to skin 3x/day as needed. Apply to  sacral area with each incontinence episode.    [provider]  docusate sodium (COLACE) 100 MG capsule Take 100 mg by mouth daily as needed for mild constipation.    [provider]  ferrous sulfate 325 (65 FE) MG tablet Take 325 mg by mouth daily with breakfast.    [provider]  lactulose (CHRONULAC) 10 GM/15ML solution Take 20 g by mouth 2 (two) times daily.    [provider]  levothyroxine (SYNTHROID, LEVOTHROID) 25 MCG tablet Take 25 mcg by mouth daily before breakfast.    [provider]  lisinopril (PRINIVIL,ZESTRIL) 20 MG tablet Take 20 mg by mouth daily.    [provider]  megestrol (MEGACE) 400 MG/10ML suspension Take 10 mLs (400 mg total) by mouth daily. Patient not taking: Reported on 07/15/2017 05/19/17   Narda Bonds, MD  OLANZapine zydis (ZYPREXA) 5 MG disintegrating tablet Take 5 mg by mouth at bedtime.    [provider]  Polyethyl Glycol-Propyl Glycol (SYSTANE) 0.4-0.3 % SOLN Place 1 drop into both eyes 3 (three) times daily.    [provider]  risperiDONE (RISPERDAL) 0.5 MG tablet Take 1 tablet (0.5 mg total) by mouth at bedtime. Patient not taking: Reported on 12/24/2017 05/18/17   Narda Bonds, MD  rivastigmine (EXELON) 9.5 mg/24hr Place 9.5 mg onto the skin daily.    [provider]  Vitamin D, Ergocalciferol, (DRISDOL) 50000 units CAPS capsule Take 50,000 Units by mouth every 7 (seven) days.    [provider]  Past Surgical History Past Surgical History:  Procedure Laterality Date  . FINGER SURGERY Right    pinky finger   Family History Family History  Family history unknown: Yes    Social History Social History   Tobacco Use  . Smoking status: Former Games developermoker  . Smokeless tobacco: Never Used  . Tobacco comment: quit smoking over 20 years  ago   Substance Use Topics  . Alcohol use: No  . Drug use: No   Allergies Shellfish-derived products  Review of Systems Review of Systems  Unable to perform ROS: Dementia    Physical Exam Vital Signs  I have reviewed the triage vital signs BP (!) 185/102 (BP Location: Left Arm)   Pulse 89   Temp 98.1 F (36.7 C) (Oral)   Resp 18   SpO2 99%   Physical Exam Constitutional:      General: She is not in acute distress.    Appearance: She is well-developed. She is not diaphoretic.  HENT:     Head: Normocephalic. Contusion present.      Right Ear: External ear normal.     Left Ear: External ear normal.     Nose: Nose normal.  Eyes:     General: No scleral icterus.       Right eye: No discharge.        Left eye: No discharge.     Conjunctiva/sclera: Conjunctivae normal.     Pupils: Pupils are equal, round, and reactive to light.  Neck:     Musculoskeletal: Normal range of motion and neck supple.  Cardiovascular:     Rate and Rhythm: Normal rate and regular rhythm.     Pulses:          Radial pulses are 2+ on the right side and 2+ on the left side.       Dorsalis pedis pulses are 2+ on the right side and 2+ on the left side.     Heart sounds: Normal heart sounds. No murmur. No friction rub. No gallop.   Pulmonary:     Effort: Pulmonary effort is normal. No respiratory distress.     Breath sounds: Normal breath sounds. No stridor. No wheezing.  Abdominal:     General: There is no distension.     Palpations: Abdomen is soft.     Tenderness: There is no abdominal tenderness.  Musculoskeletal:        General: No tenderness.     Cervical back: She exhibits no bony tenderness.     Thoracic back: She exhibits no bony tenderness.     Lumbar back: She exhibits no bony tenderness.     Comments: Clavicles stable. Chest stable to AP/Lat compression. Pelvis stable to Lat compression. No obvious extremity deformity. No chest or abdominal wall contusion.  Skin:    General:  Skin is warm and dry.     Findings: No erythema or rash.  Neurological:     Mental Status: She is alert. She is disoriented.     Comments: Moving all extremities with 5 out of 5 strength.     ED Results and Treatments Labs (all labs ordered are listed, but only abnormal results are displayed) Labs Reviewed  URINALYSIS, ROUTINE W REFLEX MICROSCOPIC - Abnormal; Notable for the following components:      Result Value   Ketones, ur 5 (*)    All other components within normal limits  EKG  EKG Interpretation  Date/Time:    Ventricular Rate:    PR Interval:    QRS Duration:   QT Interval:    QTC Calculation:   R Axis:     Text Interpretation:        Radiology Ct Head Wo Contrast  Result Date: 03/02/2018 CLINICAL DATA:  85 year old female with fall. EXAM: CT HEAD WITHOUT CONTRAST CT CERVICAL SPINE WITHOUT CONTRAST TECHNIQUE: Multidetector CT imaging of the head and cervical spine was performed following the standard protocol without intravenous contrast. Multiplanar CT image reconstructions of the cervical spine were also generated. COMPARISON:  Head CT dated 12/20/2017 FINDINGS: CT HEAD FINDINGS Brain: The ventricles and sulci appropriate size for patient's age. Mild periventricular and deep white matter chronic microvascular ischemic changes noted. There is no acute intracranial hemorrhage. No mass effect or midline shift. No extra-axial fluid collection. Vascular: No hyperdense vessel or unexpected calcification. Skull: Normal. Negative for fracture or focal lesion. Sinuses/Orbits: No acute finding. Other: Left forehead hematoma. CT CERVICAL SPINE FINDINGS Alignment: Normal. Skull base and vertebrae: No acute fracture. No primary bone lesion or focal pathologic process. Osteopenia. Soft tissues and spinal canal: No prevertebral fluid or swelling. No visible canal  hematoma. Disc levels:  No acute findings. Degenerative changes. Upper chest: Negative. Other: Mild bilateral carotid bulb calcified plaques. IMPRESSION: 1. No acute intracranial hemorrhage. 2. No acute/traumatic cervical spine pathology. Electronically Signed   By: Elgie Collard M.D.   On: 03/02/2018 02:10   Ct Cervical Spine Wo Contrast  Result Date: 03/02/2018 CLINICAL DATA:  85 year old female with fall. EXAM: CT HEAD WITHOUT CONTRAST CT CERVICAL SPINE WITHOUT CONTRAST TECHNIQUE: Multidetector CT imaging of the head and cervical spine was performed following the standard protocol without intravenous contrast. Multiplanar CT image reconstructions of the cervical spine were also generated. COMPARISON:  Head CT dated 12/20/2017 FINDINGS: CT HEAD FINDINGS Brain: The ventricles and sulci appropriate size for patient's age. Mild periventricular and deep white matter chronic microvascular ischemic changes noted. There is no acute intracranial hemorrhage. No mass effect or midline shift. No extra-axial fluid collection. Vascular: No hyperdense vessel or unexpected calcification. Skull: Normal. Negative for fracture or focal lesion. Sinuses/Orbits: No acute finding. Other: Left forehead hematoma. CT CERVICAL SPINE FINDINGS Alignment: Normal. Skull base and vertebrae: No acute fracture. No primary bone lesion or focal pathologic process. Osteopenia. Soft tissues and spinal canal: No prevertebral fluid or swelling. No visible canal hematoma. Disc levels:  No acute findings. Degenerative changes. Upper chest: Negative. Other: Mild bilateral carotid bulb calcified plaques. IMPRESSION: 1. No acute intracranial hemorrhage. 2. No acute/traumatic cervical spine pathology. Electronically Signed   By: Elgie Collard M.D.   On: 03/02/2018 02:10   Pertinent labs & imaging results that were available during my care of the patient were reviewed by me and considered in my medical decision making (see chart for  details).  Medications Ordered in ED Medications  fentaNYL (SUBLIMAZE) injection 50 mcg (50 mcg Intramuscular Given 03/02/18 0148)  Procedures Procedures  (including critical care time)  Medical Decision Making / ED Course I have reviewed the nursing notes for this encounter and the patient's prior records (if available in EHR or on provided paperwork).    Unwitnessed fall from bed at nursing facility.  Patient with left forehead hematoma with superficial laceration.  Wound was irrigated.  Will allow for secondary closure.  CT head and cervical spine negative.  No other injuries noted on exam requiring imaging.  UA obtained and negative for infection.  The patient appears reasonably screened and/or stabilized for discharge and I doubt any other medical condition or other [facility] requiring further screening, evaluation, or treatment in the ED at this time prior to discharge.  The patient is safe for discharge with strict return precautions.   Final Clinical Impression(s) / ED Diagnoses Final diagnoses:  Fall, initial encounter  Contusion of face, initial encounter  Facial laceration, initial encounter   Disposition: Discharge  Condition: Good  I have discussed the results, Dx and Tx plan with the patient's niece who expressed understanding and agree(s) with the plan. Discharge instructions discussed at great length. The patient's niece was given strict return precautions who verbalized understanding of the instructions. No further questions at time of discharge.    ED Discharge Orders    None       Follow Up: Facility physician   As needed      This chart was dictated using voice recognition software.  Despite best efforts to proofread,  errors can occur which can change the documentation meaning.   Nira Conn, MD 03/02/18  [phone]

## 2018-03-02 NOTE — ED Notes (Signed)
Patient unable to sign for discharge due to dementia. Report called to facility.

## 2018-03-02 NOTE — ED Notes (Signed)
Patient transported to CT 

## 2018-03-02 NOTE — ED Notes (Signed)
[facility] contacted for transport back to [facility].

## 2018-05-23 ENCOUNTER — Other Ambulatory Visit: Payer: Self-pay

## 2018-05-23 ENCOUNTER — Encounter (HOSPITAL_COMMUNITY): Payer: Self-pay

## 2018-05-23 ENCOUNTER — Emergency Department (HOSPITAL_COMMUNITY): Payer: Medicare Other

## 2018-05-23 ENCOUNTER — Emergency Department (HOSPITAL_COMMUNITY)
Admission: EM | Admit: 2018-05-23 | Discharge: 2018-05-24 | Disposition: A | Discharge status: Skilled Nursing Facility | Payer: Medicare Other | Attending: Emergency Medicine | Admitting: Emergency Medicine

## 2018-05-23 DIAGNOSIS — W050XXA Fall from non-moving wheelchair, initial encounter: Secondary | ICD-10-CM | POA: Diagnosis not present

## 2018-05-23 DIAGNOSIS — S0181XA Laceration without foreign body of other part of head, initial encounter: Secondary | ICD-10-CM | POA: Insufficient documentation

## 2018-05-23 DIAGNOSIS — W19XXXA Unspecified fall, initial encounter: Secondary | ICD-10-CM

## 2018-05-23 DIAGNOSIS — Y999 Unspecified external cause status: Secondary | ICD-10-CM | POA: Diagnosis not present

## 2018-05-23 DIAGNOSIS — Z7982 Long term (current) use of aspirin: Secondary | ICD-10-CM | POA: Diagnosis not present

## 2018-05-23 DIAGNOSIS — Y92199 Unspecified place in other specified residential institution as the place of occurrence of the external cause: Secondary | ICD-10-CM | POA: Insufficient documentation

## 2018-05-23 DIAGNOSIS — F0390 Unspecified dementia without behavioral disturbance: Secondary | ICD-10-CM | POA: Diagnosis not present

## 2018-05-23 DIAGNOSIS — Z87891 Personal history of nicotine dependence: Secondary | ICD-10-CM | POA: Insufficient documentation

## 2018-05-23 DIAGNOSIS — Y939 Activity, unspecified: Secondary | ICD-10-CM | POA: Insufficient documentation

## 2018-05-23 DIAGNOSIS — S0990XA Unspecified injury of head, initial encounter: Secondary | ICD-10-CM | POA: Diagnosis present

## 2018-05-23 DIAGNOSIS — S0083XA Contusion of other part of head, initial encounter: Secondary | ICD-10-CM | POA: Insufficient documentation

## 2018-05-23 DIAGNOSIS — N390 Urinary tract infection, site not specified: Secondary | ICD-10-CM

## 2018-05-23 DIAGNOSIS — T148XXA Other injury of unspecified body region, initial encounter: Secondary | ICD-10-CM

## 2018-05-23 DIAGNOSIS — I10 Essential (primary) hypertension: Secondary | ICD-10-CM | POA: Insufficient documentation

## 2018-05-23 LAB — URINALYSIS, ROUTINE W REFLEX MICROSCOPIC
Bilirubin Urine: NEGATIVE
Glucose, UA: NEGATIVE mg/dL
Hgb urine dipstick: NEGATIVE
Ketones, ur: NEGATIVE mg/dL
Nitrite: POSITIVE — AB
Protein, ur: NEGATIVE mg/dL
Specific Gravity, Urine: 1.015 (ref 1.005–1.030)
WBC, UA: >50 WBC/hpf — ABNORMAL HIGH (ref 0–5)
pH: 5 (ref 5.0–8.0)

## 2018-05-23 MED ORDER — CEPHALEXIN 500 MG PO CAPS: 500.0000 mg | ORAL_CAPSULE | Freq: Two times a day (BID) | ORAL | 0 refills | Status: AC

## 2018-05-23 NOTE — ED Notes (Signed)
Bed: EF00 Expected date:  Expected time:  Means of arrival:  Comments: 85 yr old fall, hematoma and laceration

## 2018-05-23 NOTE — ED Notes (Signed)
[facility] called  

## 2018-05-23 NOTE — ED Triage Notes (Signed)
Pt arriving from [facility]. Pt had a fall, hematoma and laceration noted on pts head. Htx of dementia, currently at baseline. On hospice care, unknown DNR status.  Vitals 178/94, p-82

## 2018-05-23 NOTE — ED Provider Notes (Signed)
[facility]-EMERGENCY DEPT Provider Note   CSN: [id number] Arrival date & time: 05/23/18  2006    History   Chief Complaint Chief Complaint  Patient presents with   Fall    HPI Marissa Chung is a 85 y.o. female.     HPI  85yo female with history of dementia presents from [facility] after a fall out of wheelchair. They were getting her ready for bed with one aid in the bathroom and one making bed, could not catch her fast enough.  Hit head on floor. No LOC. Staff reports she has been ancy, trying to get out of chair all day.  Around 340PM had a prn ativan and was still trying to get out of bed.  Otherwise is at her baseline and staff does not have any other concerns.  Niece Marissa Chung reports that often her falls are associated with UTIs.  She is under hospice care but not receiving morphine yet.  Reports she has been doing well over the last few months without falls.    Past Medical History:  Diagnosis Date   Cataract    BILATERAL   Frontal lobe dementia (HCC)    History of shingles    Hypertension     Patient Active Problem List   Diagnosis Date Noted   Altered mental status 04/01/202019   Palliative care by specialist    Goals of care, counseling/discussion    Encephalopathy 05/12/2017   Syncope 03/10/2016   Acute encephalopathy 08/27/2013   Confusion 08/27/2013   Accelerated hypertension 07/26/2013   Frontotemporal dementia (HCC) 07/26/2013   Anemia 07/26/2013   Dyslipidemia 07/26/2013    Past Surgical History:  Procedure Laterality Date   FINGER SURGERY Right    pinky finger     OB History   No obstetric history on file.      Home Medications    Prior to Admission medications   Medication Sig Start Date End Date Taking? Authorizing Provider  acetaminophen (TYLENOL) 650 MG CR tablet Take 650 mg by mouth daily.   Yes [provider]  Chloroxylenol-Zinc Oxide (BAZA EX) Apply 1 application topically  See admin instructions. Apply to skin 3x/day as needed. Apply to sacral area with each incontinence episode.   Yes [provider]  CRANBERRY CONCENTRATE PO Take 1 capsule by mouth daily.   Yes [provider]  feeding supplement, GLUCERNA SHAKE, (GLUCERNA SHAKE) LIQD Take 237 mLs by mouth 3 (three) times daily between meals.   Yes [provider]  ferrous sulfate 325 (65 FE) MG tablet Take 325 mg by mouth daily with breakfast.   Yes [provider]  lactulose (CHRONULAC) 10 GM/15ML solution Take 30 g by mouth 2 (two) times daily.   Yes [provider]  mirtazapine (REMERON) 7.5 MG tablet Take 7.5 mg by mouth at bedtime. 04/03/18  Yes [provider]  Morphine Sulfate (MORPHINE CONCENTRATE) 10 mg / 0.5 ml concentrated solution Place 20 mg under the tongue See admin instructions. Take 20 mg every hour by mouth as needed for pain   Yes [provider]  OLANZapine zydis (ZYPREXA) 5 MG disintegrating tablet Take 5 mg by mouth at bedtime.   Yes [provider]  Polyethyl Glycol-Propyl Glycol (SYSTANE) 0.4-0.3 % SOLN Place 1 drop into both eyes 3 (three) times daily.   Yes [provider]  aspirin EC 81 MG EC tablet Take 1 tablet (81 mg total) by mouth daily. Patient not taking: Reported on 12/24/2017 05/19/17  Narda Bonds, MD  cephALEXin (KEFLEX) 500 MG capsule Take 1 capsule (500 mg total) by mouth 2 (two) times daily for 7 days. 05/23/18 05/30/18  Alvira Monday, MD  megestrol (MEGACE) 400 MG/10ML suspension Take 10 mLs (400 mg total) by mouth daily. Patient not taking: Reported on 07/15/2017 05/19/17   Narda Bonds, MD  risperiDONE (RISPERDAL) 0.5 MG tablet Take 1 tablet (0.5 mg total) by mouth at bedtime. Patient not taking: Reported on 12/24/2017 05/18/17   Narda Bonds, MD    Family History Family History  Family history unknown: Yes    Social History Social History   Tobacco Use   Smoking status:  Former Smoker   Smokeless tobacco: Never Used   Tobacco comment: quit smoking over 20 years ago   Substance Use Topics   Alcohol use: No   Drug use: No     Allergies   Shellfish-derived products   Review of Systems Review of Systems  Unable to perform ROS: Dementia     Physical Exam  ED Triage Vitals [05/23/18 2019]  Enc Vitals Group     BP (!) 162/97     Pulse Rate 81     Resp 14     Temp 98.3 F (36.8 C)     Temp Source Oral     SpO2 98 %     Weight      Height      Head Circumference      Peak Flow      Pain Score      Pain Loc      Pain Edu?      Excl. in GC?       Physical Exam Vitals signs and nursing note reviewed.  Constitutional:      General: She is not in acute distress.    Appearance: She is well-developed. She is cachectic. She is not diaphoretic.  HENT:     Head:     Comments: Hematoma frontal and 2cm superficial laceration Eyes:     Comments: miosis  Neck:     Musculoskeletal: Normal range of motion.  Cardiovascular:     Rate and Rhythm: Normal rate and regular rhythm.     Heart sounds: Normal heart sounds. No murmur. No friction rub. No gallop.   Pulmonary:     Effort: Pulmonary effort is normal. No respiratory distress.  Abdominal:     Palpations: Abdomen is soft.     Tenderness: There is no abdominal tenderness.  Musculoskeletal:        General: No tenderness (does not appear to have tenderness of extremities).  Skin:    General: Skin is warm and dry.     Findings: No erythema or rash.  Neurological:     GCS: GCS eye subscore is 4. GCS verbal subscore is 2. GCS motor subscore is 4.     Comments: Sleepy, will moan, withdraw to pain, moves all 4 ext      ED Treatments / Results  Labs (all labs ordered are listed, but only abnormal results are displayed) Labs Reviewed  URINALYSIS, ROUTINE W REFLEX MICROSCOPIC - Abnormal; Notable for the following components:      Result Value   APPearance HAZY (*)    Nitrite POSITIVE  (*)    Leukocytes,Ua SMALL (*)    WBC, UA >50 (*)    Bacteria, UA MANY (*)    All other components within normal limits  URINE CULTURE    EKG None  Radiology  Ct Head Wo Contrast  Result Date: 05/23/2018 CLINICAL DATA:  85 year old female status post fall with laceration. EXAM: CT HEAD WITHOUT CONTRAST CT CERVICAL SPINE WITHOUT CONTRAST TECHNIQUE: Multidetector CT imaging of the head and cervical spine was performed following the standard protocol without intravenous contrast. Multiplanar CT image reconstructions of the cervical spine were also generated. COMPARISON:  03/02/2018 and earlier. FINDINGS: CT HEAD FINDINGS Brain: Stable and largely normal for age gray-white matter differentiation throughout the brain. No ventriculomegaly. No midline shift, mass effect, or evidence of intracranial mass lesion. No acute intracranial hemorrhage identified. No cortically based acute infarct identified. Vascular: Calcified atherosclerosis at the skull base. No suspicious intracranial vascular hyperdensity. Skull: Stable.  No acute osseous abnormality identified. Sinuses/Orbits: Mild new bubbly opacity in the left sphenoid, otherwise stable and well pneumatized paranasal sinuses, tympanic cavities and mastoids. Other: Stable orbits soft tissues. Right forehead scalp hematoma up to 5 millimeters in thickness. No scalp soft tissue gas. CT CERVICAL SPINE FINDINGS Alignment: Preserved cervical lordosis. Stable cervicothoracic junction alignment. Bilateral posterior element alignment is within normal limits. Skull base and vertebrae: Stable heterogeneous bone mineralization. Visualized skull base is intact. No atlanto-occipital dissociation. No acute osseous abnormality identified. Mild motion artifact at C5. Soft tissues and spinal canal: No prevertebral fluid or swelling. No visible canal hematoma. Stable noncontrast neck soft tissues. Disc levels:  Degenerative changes are mild for age and stable. Upper chest:  Visible upper thoracic levels appear intact. Negative lung apices. IMPRESSION: 1. Right forehead scalp hematoma without underlying skull fracture. 2. No other acute traumatic injury identified in the head or cervical spine. 3. Stable and negative for age noncontrast CT appearance of the brain. Electronically Signed   By: Odessa Fleming M.D.   On: 05/23/2018 21:38   Ct Cervical Spine Wo Contrast  Result Date: 05/23/2018 CLINICAL DATA:  85 year old female status post fall with laceration. EXAM: CT HEAD WITHOUT CONTRAST CT CERVICAL SPINE WITHOUT CONTRAST TECHNIQUE: Multidetector CT imaging of the head and cervical spine was performed following the standard protocol without intravenous contrast. Multiplanar CT image reconstructions of the cervical spine were also generated. COMPARISON:  03/02/2018 and earlier. FINDINGS: CT HEAD FINDINGS Brain: Stable and largely normal for age gray-white matter differentiation throughout the brain. No ventriculomegaly. No midline shift, mass effect, or evidence of intracranial mass lesion. No acute intracranial hemorrhage identified. No cortically based acute infarct identified. Vascular: Calcified atherosclerosis at the skull base. No suspicious intracranial vascular hyperdensity. Skull: Stable.  No acute osseous abnormality identified. Sinuses/Orbits: Mild new bubbly opacity in the left sphenoid, otherwise stable and well pneumatized paranasal sinuses, tympanic cavities and mastoids. Other: Stable orbits soft tissues. Right forehead scalp hematoma up to 5 millimeters in thickness. No scalp soft tissue gas. CT CERVICAL SPINE FINDINGS Alignment: Preserved cervical lordosis. Stable cervicothoracic junction alignment. Bilateral posterior element alignment is within normal limits. Skull base and vertebrae: Stable heterogeneous bone mineralization. Visualized skull base is intact. No atlanto-occipital dissociation. No acute osseous abnormality identified. Mild motion artifact at C5. Soft  tissues and spinal canal: No prevertebral fluid or swelling. No visible canal hematoma. Stable noncontrast neck soft tissues. Disc levels:  Degenerative changes are mild for age and stable. Upper chest: Visible upper thoracic levels appear intact. Negative lung apices. IMPRESSION: 1. Right forehead scalp hematoma without underlying skull fracture. 2. No other acute traumatic injury identified in the head or cervical spine. 3. Stable and negative for age noncontrast CT appearance of the brain. Electronically Signed   By: Althea Grimmer.D.  On: 05/23/2018 21:38    Procedures Procedures (including critical care time)  Medications Ordered in ED Medications - No data to display   Initial Impression / Assessment and Plan / ED Course  I have reviewed the triage vital signs and the nursing notes.  Pertinent labs & imaging results that were available during my care of the patient were reviewed by me and considered in my medical decision making (see chart for details).        85yo female with history of dementia under hospice care presents from [facility] after a fall out of wheelchair.  Discussed plan of care with both niece Marissa Blood and [employer].  Will order CT head and CSpine as well as urine for further evaluation. Agree that labwork in the setting of no change from baseline and her goals of care does not align with her goals.   CT show no acute abnormality.  UA shows urinary tract infection.  Very small laceration appropriate for healing by secondary intention.   Given rx for keflex and will discharge back to facility for further care.   Final Clinical Impressions(s) / ED Diagnoses   Final diagnoses:  Fall, initial encounter  Hematoma  Urinary tract infection without hematuria, site unspecified  Laceration of forehead, initial encounter    ED Discharge Orders         Ordered    cephALEXin (KEFLEX) 500 MG capsule  2 times daily     05/23/18 2237             Alvira Monday, MD 05/23/18 2347

## 2018-05-25 LAB — URINE CULTURE: Culture: >=100000 — AB

## 2018-05-26 ENCOUNTER — Telehealth: Payer: Self-pay | Admitting: Emergency Medicine

## 2018-05-26 NOTE — Progress Notes (Signed)
ED Antimicrobial Stewardship Positive Culture Follow Up   Hanifah Schirra is an 85 y.o. female who presented to [facility] on 05/23/2018 with a chief complaint of  Chief Complaint  Patient presents with  . Fall    Recent Results (from the past 720 hour(s))  Urine culture     Status: Abnormal   Collection Time: 05/23/18 10:02 PM  Result Value Ref Range Status   Specimen Description   Final    URINE, CATHETERIZED Performed at [facility], [address], [place], [place] [address]    Special Requests   Final    NONE Performed at [facility], [address] [place], [place] [address]    Culture (A)  Final    >=100,000 COLONIES/mL ESCHERICHIA COLI Confirmed Extended Spectrum Beta-Lactamase Producer (ESBL).  In bloodstream infections from ESBL organisms, carbapenems are preferred over piperacillin/tazobactam. They are shown to have a lower risk of mortality.    Report Status 05/25/2018 FINAL  Final   Organism ID, Bacteria ESCHERICHIA COLI (A)  Final      Susceptibility   Escherichia coli - MIC*    AMPICILLIN >=32 RESISTANT Resistant     CEFAZOLIN >=64 RESISTANT Resistant     CEFTRIAXONE RESISTANT Resistant     CIPROFLOXACIN >=4 RESISTANT Resistant     GENTAMICIN <=1 SENSITIVE Sensitive     IMIPENEM <=0.25 SENSITIVE Sensitive     NITROFURANTOIN <=16 SENSITIVE Sensitive     TRIMETH/SULFA >=320 RESISTANT Resistant     AMPICILLIN/SULBACTAM >=32 RESISTANT Resistant     PIP/TAZO <=4 SENSITIVE Sensitive     Extended ESBL POSITIVE Resistant     * >=100,000 COLONIES/mL ESCHERICHIA COLI    [x]  Treated with Keflex, organism resistant to prescribed antimicrobial  Assessment:  Patient resides at [facility]. Pt was brought to the ED after a fall with a laceration. Pt has dementia. Family reports history of falls when patient has UTI. UA obtained (+ nitritates, hazy, WBC >50, bacteria present).  Urine culture was obtained and patient was  discharged on cephalexin. No WBC or SCr were obtained on ED visit. Pt was afebrile.   Urine culture resulted growing >100 K E.coli ESBL +. Given dementia and difficulty to assess for presence of symptoms of UTI, hard to determine if colonization vs. Pathogenic. I spoke with a staff member at [facility] who confirmed that they have on call provider coverage for the weekend that would be able to review culture data.   Recommendation: Recommend MD to assess patient for colonization vs. Pathogen. Discontinue Keflex.   If patient has improved and is not showing any signs of infection (symptoms of dysuria, increased urinary frequency, abdominal pain, N/V), afebrile, no elevated white count could possibly be colonization. If patient has not improved or has infectious symptoms, fever, elevated white count and/or MD determines bug to be pathogenic - would recommend IV antibiotics. MD to assess for the need to return to hospital for IV antibiotics vs. Administering at facility. If patient requires IV antibiotics, recommend meropenem or ertapenem. Unable to recommend dose given lack of SCr to determine renal function.   Sent culture data to [facility] with recommendation.  Jodelle Red Valiant Dills 05/26/2018, 11:35 AM Clinical Pharmacist [phone]

## 2018-05-26 NOTE — Telephone Encounter (Signed)
Post ED Visit - Positive Culture Follow-up: Successful Patient Follow-Up  Culture assessed and recommendations reviewed by:  []  Enzo Bi, Pharm.D. []  Celedonio Miyamoto, Pharm.D., BCPS AQ-ID []  Garvin Fila, Pharm.D., BCPS []  Georgina Pillion, Pharm.D., BCPS []  [place], [address].D., BCPS, AAHIVP []  Estella Husk, Pharm.D., BCPS, AAHIVP []  Lysle Pearl, PharmD, BCPS []  Phillips Climes, PharmD, BCPS []  Agapito Games, PharmD, BCPS [x]  Keturah Barre, PharmD  Positive urine culture  []  Patient discharged without antimicrobial prescription and treatment is now indicated [x]  Organism is resistant to prescribed ED discharge antimicrobial []  Patient with positive blood cultures  **Patient resident @ [facility] [phone], Pharmacist and RN contacted [facility] and notified nurse Earnestine Mealing. Result faxed to [phone].  Contacted patient, date 05/26/2018, time 1345   Norm Parcel 05/26/2018, 1:55 PM

## 2018-06-19 ENCOUNTER — Emergency Department (HOSPITAL_COMMUNITY)
Admission: EM | Admit: 2018-06-19 | Discharge: 2018-06-20 | Disposition: A | Discharge status: Home / Self Care | Attending: Emergency Medicine | Admitting: Emergency Medicine

## 2018-06-19 ENCOUNTER — Other Ambulatory Visit: Payer: Self-pay

## 2018-06-19 ENCOUNTER — Emergency Department (HOSPITAL_COMMUNITY)

## 2018-06-19 ENCOUNTER — Encounter (HOSPITAL_COMMUNITY): Payer: Self-pay

## 2018-06-19 DIAGNOSIS — F0390 Unspecified dementia without behavioral disturbance: Secondary | ICD-10-CM | POA: Insufficient documentation

## 2018-06-19 DIAGNOSIS — Z79899 Other long term (current) drug therapy: Secondary | ICD-10-CM | POA: Diagnosis not present

## 2018-06-19 DIAGNOSIS — I10 Essential (primary) hypertension: Secondary | ICD-10-CM | POA: Insufficient documentation

## 2018-06-19 DIAGNOSIS — W19XXXA Unspecified fall, initial encounter: Secondary | ICD-10-CM | POA: Insufficient documentation

## 2018-06-19 DIAGNOSIS — Z87891 Personal history of nicotine dependence: Secondary | ICD-10-CM | POA: Diagnosis not present

## 2018-06-19 DIAGNOSIS — Y999 Unspecified external cause status: Secondary | ICD-10-CM | POA: Insufficient documentation

## 2018-06-19 DIAGNOSIS — Y92128 Other place in nursing home as the place of occurrence of the external cause: Secondary | ICD-10-CM | POA: Insufficient documentation

## 2018-06-19 DIAGNOSIS — S0101XA Laceration without foreign body of scalp, initial encounter: Secondary | ICD-10-CM | POA: Insufficient documentation

## 2018-06-19 DIAGNOSIS — Y939 Activity, unspecified: Secondary | ICD-10-CM | POA: Insufficient documentation

## 2018-06-19 NOTE — ED Triage Notes (Signed)
Per ems: pt coming from [facility] on [address] after unwitnessed fall in the dayroom. Pt has a laceration above the right ear. Bleeding controlled. No blood thinners. Hx of dementia, currently at baseline. Pt is a DNR

## 2018-06-19 NOTE — ED Provider Notes (Signed)
[facility]-EMERGENCY DEPT Provider Note   CSN: [id number] Arrival date & time: 06/19/18  1919    History   Chief Complaint Chief Complaint  Patient presents with  . Fall    HPI Marissa Chung is a 85 y.o. female.     HPI Patient coming from nursing home with unwitnessed fall and head injury.  Patient has baseline dementia and is unable to contribute to history.  Level 5 caveat applies.  Last tetanus was then 2019. Past Medical History:  Diagnosis Date  . Cataract    BILATERAL  . Frontal lobe dementia (HCC)   . History of shingles   . Hypertension     Patient Active Problem List   Diagnosis Date Noted  . Altered mental status 09/13/2017  . Palliative care by specialist   . Goals of care, counseling/discussion   . Encephalopathy 05/12/2017  . Syncope 03/10/2016  . Acute encephalopathy 08/27/2013  . Confusion 08/27/2013  . Accelerated hypertension 07/26/2013  . Frontotemporal dementia (HCC) 07/26/2013  . Anemia 07/26/2013  . Dyslipidemia 07/26/2013    Past Surgical History:  Procedure Laterality Date  . FINGER SURGERY Right    pinky finger     OB History   No obstetric history on file.      Home Medications    Prior to Admission medications   Medication Sig Start Date End Date Taking? Authorizing Provider  acetaminophen (TYLENOL) 650 MG CR tablet Take 650 mg by mouth daily.   Yes [provider]  acetaminophen (TYLENOL) 650 MG suppository Place 650 mg rectally every 4 (four) hours as needed for moderate pain.   Yes [provider]  Chloroxylenol-Zinc Oxide (BAZA EX) Apply 1 application topically See admin instructions. Apply to skin 3x/day as needed. Apply to sacral area with each incontinence episode.   Yes [provider]  docusate sodium (COLACE) 100 MG capsule Take 100 mg by mouth daily as needed for mild constipation.   Yes [provider]  feeding supplement, GLUCERNA SHAKE, (GLUCERNA SHAKE)  LIQD Take 237 mLs by mouth 3 (three) times daily between meals.   Yes [provider]  ferrous sulfate 325 (65 FE) MG tablet Take 325 mg by mouth daily with breakfast.   Yes [provider]  hyoscyamine (LEVSIN) 0.125 MG tablet Take 0.125 mg by mouth every 4 (four) hours as needed (secretions).   Yes [provider]  lactulose (CHRONULAC) 10 GM/15ML solution Take 30 g by mouth 2 (two) times daily.   Yes [provider]  LORazepam (ATIVAN) 0.5 MG tablet Take 0.5 mg by mouth every 4 (four) hours as needed for anxiety (agitation).   Yes [provider]  mirtazapine (REMERON) 7.5 MG tablet Take 7.5 mg by mouth at bedtime. 04/03/18  Yes [provider]  morphine (ROXANOL) 20 MG/ML concentrated solution Take 5 mg by mouth every hour as needed for severe pain.    Yes [provider]  OLANZapine zydis (ZYPREXA) 5 MG disintegrating tablet Take 5 mg by mouth at bedtime.   Yes [provider]  Polyethyl Glycol-Propyl Glycol (SYSTANE) 0.4-0.3 % SOLN Place 1 drop into both eyes 3 (three) times daily.   Yes [provider]  aspirin EC 81 MG EC tablet Take 1 tablet (81 mg total) by mouth daily. Patient not taking: Reported on 12/24/2017 05/19/17   Narda BondsNettey, Ralph A, MD  megestrol (MEGACE) 400 MG/10ML suspension Take 10 mLs (400 mg total) by mouth daily. Patient not taking: Reported on 07/15/2017  05/19/17   Narda BondsNettey, Ralph A, MD  risperiDONE (RISPERDAL) 0.5 MG tablet Take 1 tablet (0.5 mg total) by mouth at bedtime. Patient not taking: Reported on 12/24/2017 05/18/17   Narda BondsNettey, Ralph A, MD    Family History Family History  Family history unknown: Yes    Social History Social History   Tobacco Use  . Smoking status: Former Games developermoker  . Smokeless tobacco: Never Used  . Tobacco comment: quit smoking over 20 years ago   Substance Use Topics  . Alcohol use: No  . Drug use: No     Allergies   Shellfish-derived products   Review of  Systems Review of Systems  Unable to perform ROS: Dementia     Physical Exam Updated Vital Signs BP (!) 159/69 (BP Location: Right Arm)   Pulse 64   Temp 97.7 F (36.5 C) (Oral)   Resp 16   Ht 5\' 9"  (1.753 m)   Wt 59 kg   SpO2 98%   BMI 19.20 kg/m   Physical Exam Vitals signs and nursing note reviewed.  Constitutional:      Appearance: Normal appearance. She is well-developed.  HENT:     Head: Normocephalic.     Comments: 1 cm laceration to the posterior right parietal scalp.  No active bleeding.  No gross contamination.    Mouth/Throat:     Mouth: Mucous membranes are moist.  Eyes:     Pupils: Pupils are equal, round, and reactive to light.  Neck:     Comments: Cervical collar in place. Cardiovascular:     Rate and Rhythm: Normal rate and regular rhythm.     Heart sounds: No murmur. No friction rub. No gallop.   Pulmonary:     Effort: Pulmonary effort is normal. No respiratory distress.     Breath sounds: Normal breath sounds. No stridor. No wheezing, rhonchi or rales.  Chest:     Chest wall: No tenderness.  Abdominal:     General: Bowel sounds are normal.     Palpations: Abdomen is soft.     Tenderness: There is no abdominal tenderness. There is no guarding or rebound.  Musculoskeletal: Normal range of motion.        General: No tenderness.     Comments: Pelvis is stable.  Skin:    General: Skin is warm and dry.     Findings: No erythema or rash.  Neurological:     Mental Status: She is alert.     Comments: Patient intermittently following commands.  Appears to move all extremities without focal deficit.  Psychiatric:        Behavior: Behavior normal.      ED Treatments / Results  Labs (all labs ordered are listed, but only abnormal results are displayed) Labs Reviewed - No data to display  EKG None  Radiology Ct Head Wo Contrast  Result Date: 06/19/2018 CLINICAL DATA:  85 y/o F; unwitnessed fall. Laceration above the right ear. History of  dementia. EXAM: CT HEAD WITHOUT CONTRAST CT CERVICAL SPINE WITHOUT CONTRAST TECHNIQUE: Multidetector CT imaging of the head and cervical spine was performed following the standard protocol without intravenous contrast. Multiplanar CT image reconstructions of the cervical spine were also generated. COMPARISON:  05/23/2018 CT head. FINDINGS: CT HEAD FINDINGS Brain: No evidence of acute infarction, hemorrhage, hydrocephalus, extra-axial collection or mass lesion/mass effect. Stable nonspecific white matter hypodensities compatible with chronic microvascular ischemic changes. Stable volume loss of the brain. Vascular: Calcific atherosclerosis of carotid siphons. No hyperdense vessel.  Skull: Small right lateral scalp laceration. Sinuses/Orbits: No acute finding. Other: None. CT CERVICAL SPINE FINDINGS Alignment: Normal. Skull base and vertebrae: No acute fracture. No primary bone lesion or focal pathologic process. Soft tissues and spinal canal: No prevertebral fluid or swelling. No visible canal hematoma. Disc levels: Stable mild cervical spondylosis with discogenic degenerative changes greatest at the C5-C7 levels. No high-grade bony spinal canal stenosis. Upper chest: Negative. Other: Negative. IMPRESSION: 1. Small right lateral scalp laceration. No calvarial fracture. 2. No acute intracranial abnormality. 3. Stable chronic microvascular ischemic changes and parenchymal volume loss of the brain. 4. No acute fracture or dislocation of the cervical spine. 5. Stable mild cervical spondylosis greatest at the C5-C7 levels. Electronically Signed   By: Mitzi HansenLance  Furusawa-Stratton M.D.   On: 06/19/2018 22:28   Ct Cervical Spine Wo Contrast  Result Date: 06/19/2018 CLINICAL DATA:  85 y/o F; unwitnessed fall. Laceration above the right ear. History of dementia. EXAM: CT HEAD WITHOUT CONTRAST CT CERVICAL SPINE WITHOUT CONTRAST TECHNIQUE: Multidetector CT imaging of the head and cervical spine was performed following the  standard protocol without intravenous contrast. Multiplanar CT image reconstructions of the cervical spine were also generated. COMPARISON:  05/23/2018 CT head. FINDINGS: CT HEAD FINDINGS Brain: No evidence of acute infarction, hemorrhage, hydrocephalus, extra-axial collection or mass lesion/mass effect. Stable nonspecific white matter hypodensities compatible with chronic microvascular ischemic changes. Stable volume loss of the brain. Vascular: Calcific atherosclerosis of carotid siphons. No hyperdense vessel. Skull: Small right lateral scalp laceration. Sinuses/Orbits: No acute finding. Other: None. CT CERVICAL SPINE FINDINGS Alignment: Normal. Skull base and vertebrae: No acute fracture. No primary bone lesion or focal pathologic process. Soft tissues and spinal canal: No prevertebral fluid or swelling. No visible canal hematoma. Disc levels: Stable mild cervical spondylosis with discogenic degenerative changes greatest at the C5-C7 levels. No high-grade bony spinal canal stenosis. Upper chest: Negative. Other: Negative. IMPRESSION: 1. Small right lateral scalp laceration. No calvarial fracture. 2. No acute intracranial abnormality. 3. Stable chronic microvascular ischemic changes and parenchymal volume loss of the brain. 4. No acute fracture or dislocation of the cervical spine. 5. Stable mild cervical spondylosis greatest at the C5-C7 levels. Electronically Signed   By: Mitzi HansenLance  Furusawa-Stratton M.D.   On: 06/19/2018 22:28    Procedures .Marland Kitchen.Laceration Repair  Date/Time: 06/19/2018 10:52 PM Performed by: Loren RacerYelverton, Carloyn Lahue, MD Authorized by: Loren RacerYelverton, Tyann Niehaus, MD   Consent:    Consent obtained:  Emergent situation Anesthesia (see MAR for exact dosages):    Anesthesia method:  None Laceration details:    Location:  Scalp   Scalp location:  R parietal   Length (cm):  1 Repair type:    Repair type:  Simple Exploration:    Contaminated: no   Skin repair:    Repair method:  Staples   Number of  staples:  1 Approximation:    Approximation:  Close Post-procedure details:    Patient tolerance of procedure:  Tolerated well, no immediate complications   (including critical care time)  Medications Ordered in ED Medications - No data to display   Initial Impression / Assessment and Plan / ED Course  I have reviewed the triage vital signs and the nursing notes.  Pertinent labs & imaging results that were available during my care of the patient were reviewed by me and considered in my medical decision making (see chart for details).        CT head cervical spine without acute findings.  Laceration repaired.  Appears to be  at her baseline mental status.  Will discharge back to nursing home.  Final Clinical Impressions(s) / ED Diagnoses   Final diagnoses:  Laceration of scalp, initial encounter    ED Discharge Orders    None       Julianne Rice, MD 06/19/18 2254

## 2018-06-19 NOTE — ED Notes (Signed)
Bed: WA17 Expected date:  Expected time:  Means of arrival:  Comments: EMS: fall w/ lac

## 2018-06-19 NOTE — ED Notes (Signed)
Pt back from radiology 

## 2018-06-19 NOTE — ED Notes (Signed)
[facility] contacted and paperwork printed  

## 2018-06-20 NOTE — ED Notes (Signed)
[facility] at bedside 

## 2018-10-03 DEATH — deceased

## 2020-07-27 IMAGING — CT CT MAXILLOFACIAL W/O CM
5 of 11 series · 16 of 47 positions shown, 18 images · non-contrast
Comparison: 09/16/2017 CT head and cervical spine. 07/16/2017 CT
maxillofacial.

CLINICAL DATA: 84 y/o F; unwitnessed fall with left-sided facial
pain.

EXAM:
CT HEAD WITHOUT CONTRAST
CT MAXILLOFACIAL WITHOUT CONTRAST
CT CERVICAL SPINE WITHOUT CONTRAST
TECHNIQUE: Multidetector CT imaging of the head, cervical spine, and
maxillofacial structures were performed using the standard protocol
without intravenous contrast. Multiplanar CT image reconstructions
of the cervical spine and maxillofacial structures were also
generated.

[Series 4: head bone · axial · 0.47mm/px · z∈[+1590,+1638]mm · 2 of 49 slices shown]
[im 17/49  bone]
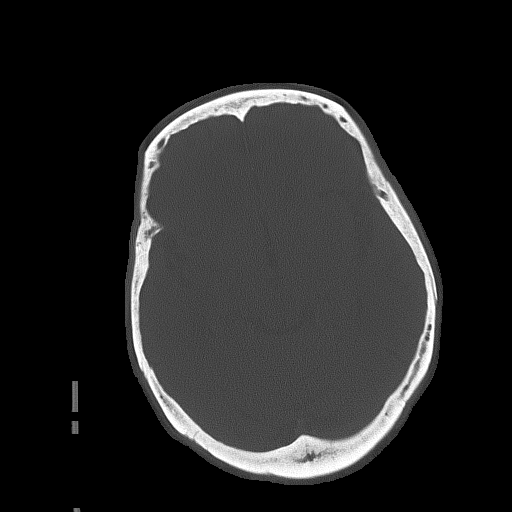
[im 33/49  bone]
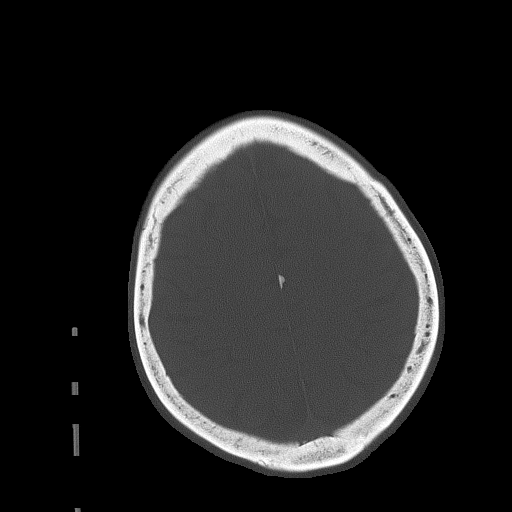

[Series 9: coronal soft · coronal · 0.30mm/px · 3 of 69 slices shown]
[im 18/69  bone]
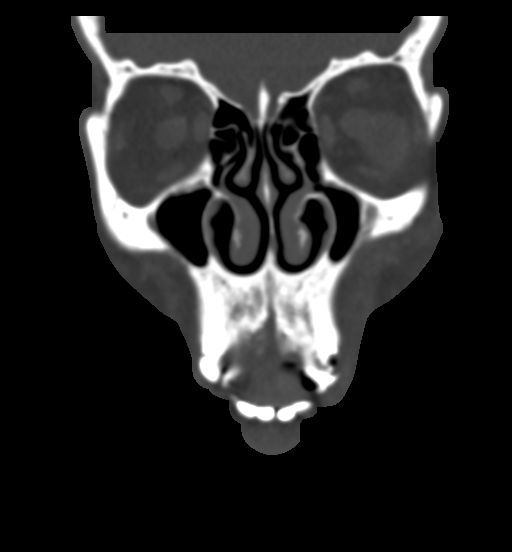
[im 35/69  bone]
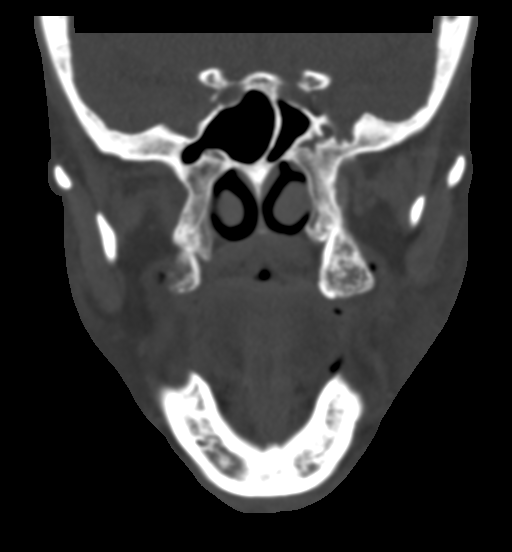
[im 52/69  bone]
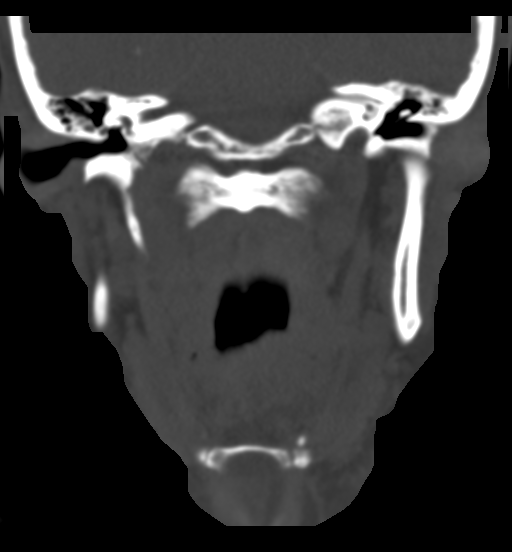

[Series 10: sagittal soft · sagittal · 0.28mm/px · 1 of 74 slices shown]
[im 37/74  bone]
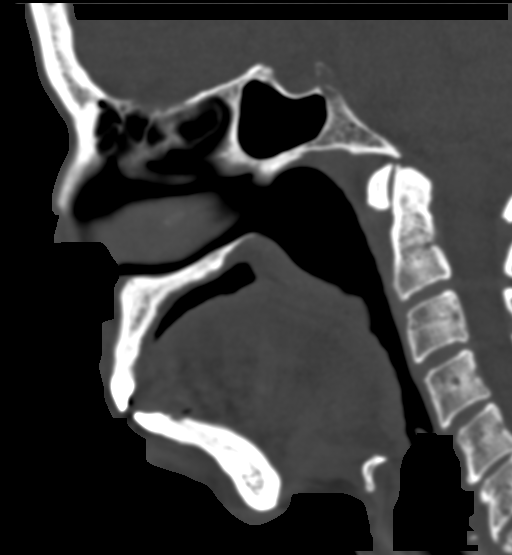

[Series 14: c spine soft · axial · 0.33mm/px · z∈[+1400,+1454]mm · 3 of 96 slices shown]
[im 14/96  brain]
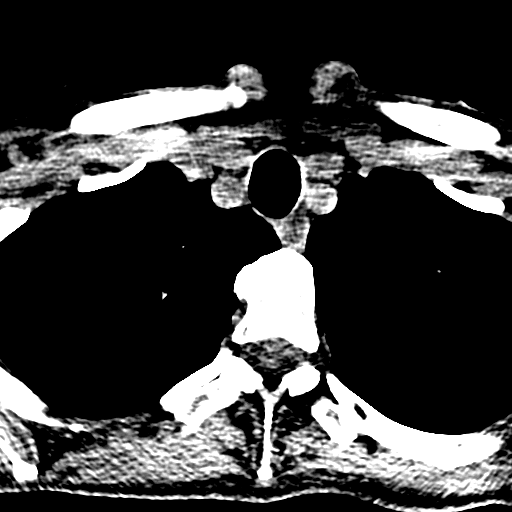
[im 28/96  brain]
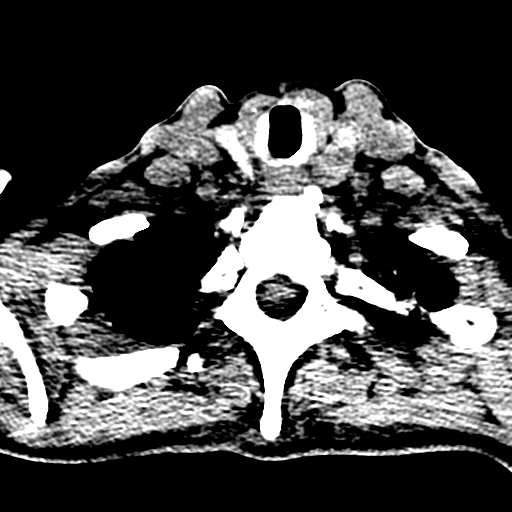
[im 41/96  brain]
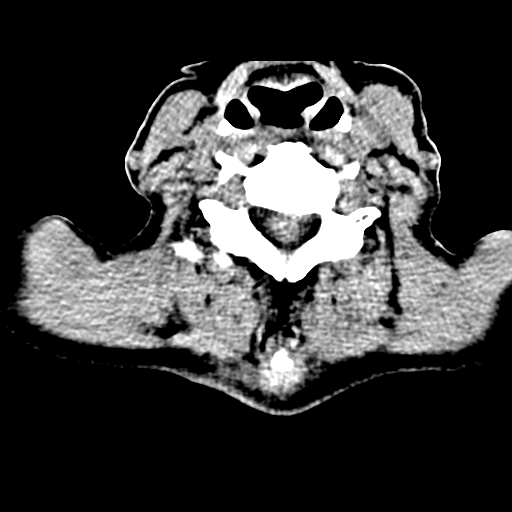

[Series 15: orthogonal axials · axial · 0.20mm/px · z∈[+1384,+1517]mm · 7 of 100 slices shown, 9 images]
[im 13/100  brain]
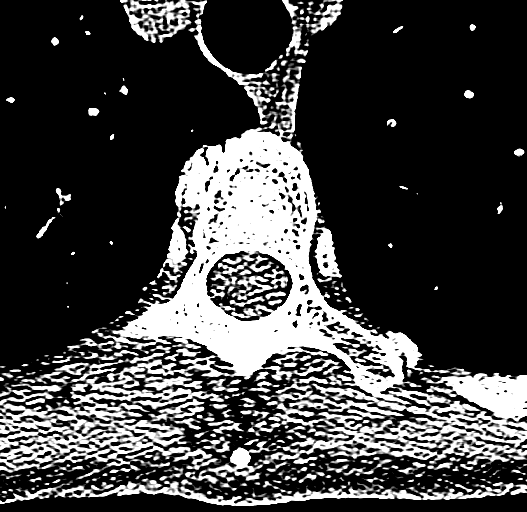
[im 13/100  bone]
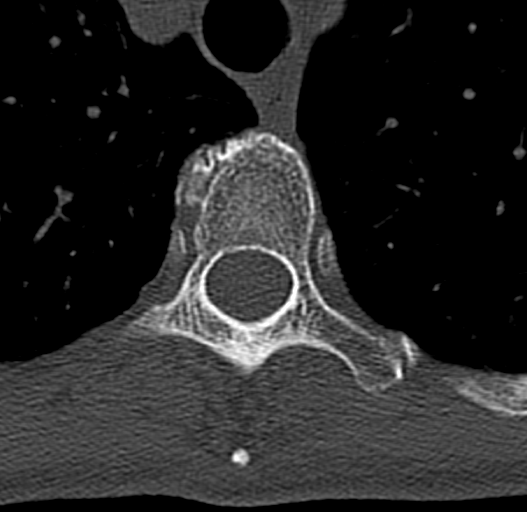
[im 25/100  bone]
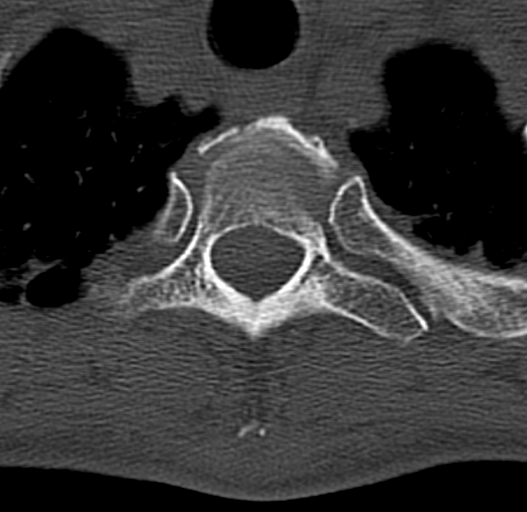
[im 38/100  bone]
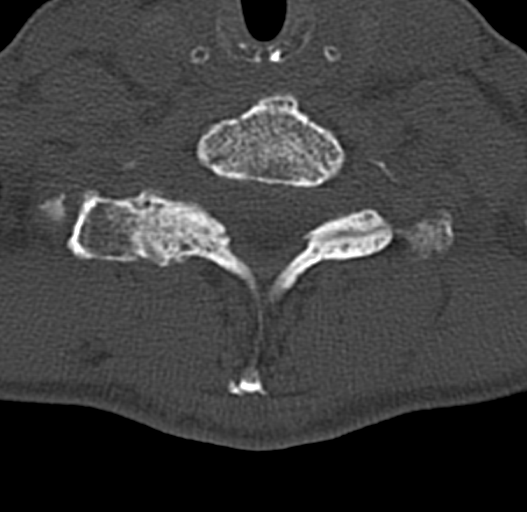
[im 50/100  bone]
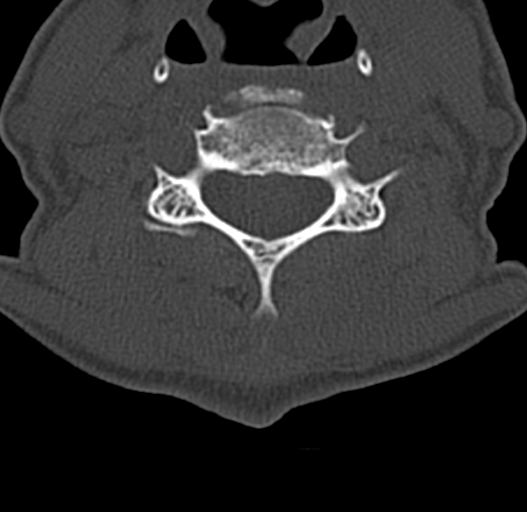
[im 62/100  brain]
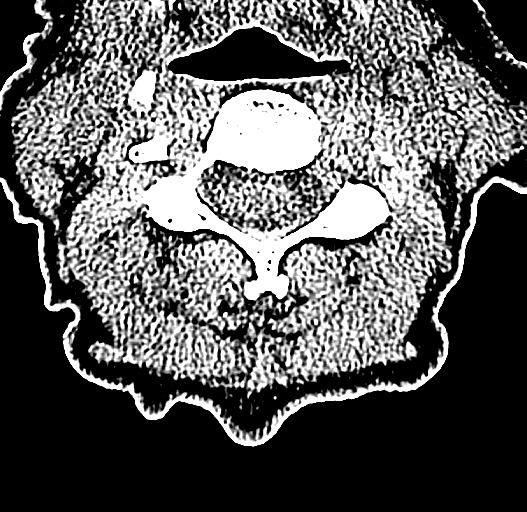
[im 62/100  bone]
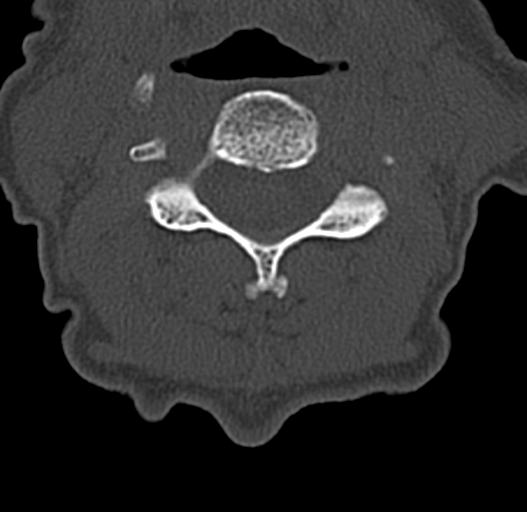
[im 75/100  bone]
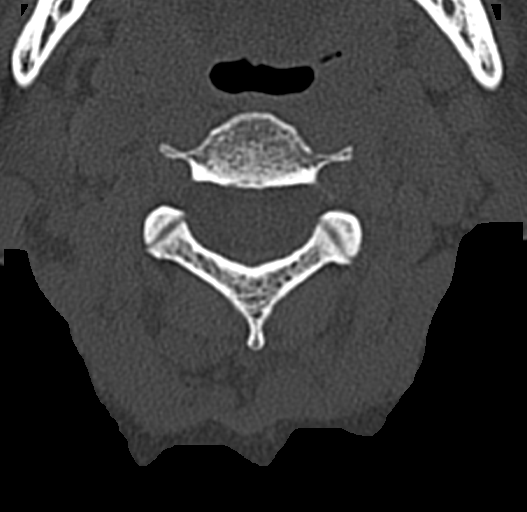
[im 87/100  bone]
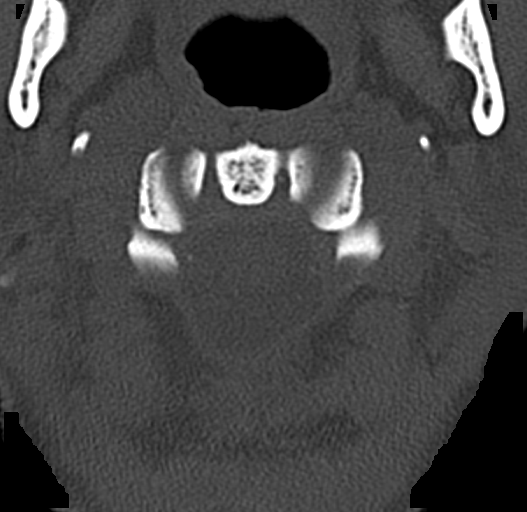

[16 of 47 positions shown; findings below may reference images not displayed]

FINDINGS: CT HEAD FINDINGS

Brain: No evidence of acute infarction, hemorrhage, hydrocephalus,
extra-axial collection or mass lesion/mass effect. Stable chronic
microvascular ischemic changes and volume loss of the brain.

Vascular: Calcific atherosclerosis of the carotid siphons. No
hyperdense vessel identified.

Skull: Normal. Negative for fracture or focal lesion.

Other: None.

CT MAXILLOFACIAL FINDINGS

Osseous: No fracture or mandibular dislocation. No destructive
process. Extensive dental disease with multiple dental caries and
absent dental crowns.

Orbits: Negative. No traumatic or inflammatory finding.

Sinuses: Clear.

Soft tissues: Left facial superficial soft tissue contusion and
small hematoma.

CT CERVICAL SPINE FINDINGS

Alignment: Normal.

Skull base and vertebrae: No acute fracture. No primary bone lesion
or focal pathologic process.

Soft tissues and spinal canal: No prevertebral fluid or swelling. No
visible canal hematoma.

Disc levels: Mild discogenic degenerative changes at the C5-C7
levels with loss of intervertebral disc space height and endplate
marginal osteophytes. No significant bony foraminal or canal
stenosis.

Upper chest: Negative.

Other: Stable 17 mm nodule within the left lobe of the thyroid.
IMPRESSION: CT head:

1. No acute intracranial abnormality or calvarial fracture.
2. Stable chronic microvascular ischemic changes and volume loss of
the brain.

CT maxillofacial:

1. Left facial superficial soft tissue contusion and small hematoma.
2. No acute facial fracture or mandibular dislocation.
3. Extensive dental disease with multiple dental caries and absent
dental crowns.

CT CERVICAL SPINE:

1. No acute fracture or dislocation.
2. Stable 17 mm nodule in the left lobe of the thyroid. This can be
further characterized with thyroid ultrasound if clinically
indicated.
